# Patient Record
Sex: Female | Born: 1975 | Hispanic: Yes | Marital: Married | Smoking: Never smoker
Health system: Southern US, Community
[De-identification: ages and names within clinical notes are randomized; demographics above are authoritative.]

## PROBLEM LIST (undated history)

## (undated) ENCOUNTER — Inpatient Hospital Stay: Payer: Self-pay

## (undated) DIAGNOSIS — K219 Gastro-esophageal reflux disease without esophagitis: Secondary | ICD-10-CM

## (undated) DIAGNOSIS — R7303 Prediabetes: Secondary | ICD-10-CM

## (undated) DIAGNOSIS — O24419 Gestational diabetes mellitus in pregnancy, unspecified control: Secondary | ICD-10-CM

## (undated) DIAGNOSIS — L700 Acne vulgaris: Secondary | ICD-10-CM

## (undated) DIAGNOSIS — G43909 Migraine, unspecified, not intractable, without status migrainosus: Secondary | ICD-10-CM

## (undated) DIAGNOSIS — K2970 Gastritis, unspecified, without bleeding: Secondary | ICD-10-CM

## (undated) DIAGNOSIS — R1013 Epigastric pain: Secondary | ICD-10-CM

## (undated) HISTORY — DX: Acne vulgaris: L70.0

## (undated) HISTORY — PX: DILATION AND CURETTAGE OF UTERUS: SHX78

## (undated) HISTORY — DX: Gestational diabetes mellitus in pregnancy, unspecified control: O24.419

## (undated) HISTORY — DX: Gastro-esophageal reflux disease without esophagitis: K21.9

## (undated) HISTORY — DX: Epigastric pain: R10.13

## (undated) HISTORY — DX: Gastritis, unspecified, without bleeding: K29.70

## (undated) HISTORY — PX: OTHER SURGICAL HISTORY: SHX169

## (undated) HISTORY — DX: Migraine, unspecified, not intractable, without status migrainosus: G43.909

---
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
## 2007-03-03 ENCOUNTER — Ambulatory Visit: Payer: Self-pay | Admitting: Gastroenterology

## 2007-07-06 ENCOUNTER — Inpatient Hospital Stay: Payer: Self-pay | Admitting: Internal Medicine

## 2009-06-13 ENCOUNTER — Ambulatory Visit: Payer: Self-pay | Admitting: Internal Medicine

## 2009-06-21 ENCOUNTER — Inpatient Hospital Stay: Payer: Self-pay | Admitting: *Deleted

## 2009-12-26 ENCOUNTER — Emergency Department: Payer: Self-pay | Admitting: Emergency Medicine

## 2010-02-27 ENCOUNTER — Ambulatory Visit: Payer: Self-pay | Admitting: Unknown Physician Specialty

## 2010-03-03 LAB — PATHOLOGY REPORT

## 2010-08-16 ENCOUNTER — Emergency Department: Payer: Self-pay | Admitting: Emergency Medicine

## 2010-08-17 ENCOUNTER — Emergency Department: Payer: Self-pay | Admitting: Internal Medicine

## 2013-09-26 ENCOUNTER — Emergency Department: Payer: Self-pay | Admitting: Emergency Medicine

## 2013-09-26 LAB — COMPREHENSIVE METABOLIC PANEL
ALBUMIN: 3.7 g/dL (ref 3.4–5.0)
ALK PHOS: 94 U/L
ALT: 25 U/L
ANION GAP: 8 (ref 7–16)
BUN: 22 mg/dL — ABNORMAL HIGH (ref 7–18)
Bilirubin,Total: 0.3 mg/dL (ref 0.2–1.0)
CHLORIDE: 104 mmol/L (ref 98–107)
Calcium, Total: 8.7 mg/dL (ref 8.5–10.1)
Co2: 27 mmol/L (ref 21–32)
Creatinine: 0.81 mg/dL (ref 0.60–1.30)
GLUCOSE: 98 mg/dL (ref 65–99)
OSMOLALITY: 281 (ref 275–301)
Potassium: 3.5 mmol/L (ref 3.5–5.1)
SGOT(AST): 27 U/L (ref 15–37)
SODIUM: 139 mmol/L (ref 136–145)
Total Protein: 7.9 g/dL (ref 6.4–8.2)

## 2013-09-26 LAB — CBC
HCT: 42.3 % (ref 35.0–47.0)
HGB: 13.7 g/dL (ref 12.0–16.0)
MCH: 28.3 pg (ref 26.0–34.0)
MCHC: 32.3 g/dL (ref 32.0–36.0)
MCV: 88 fL (ref 80–100)
Platelet: 342 10*3/uL (ref 150–440)
RBC: 4.83 10*6/uL (ref 3.80–5.20)
RDW: 13.4 % (ref 11.5–14.5)
WBC: 9 10*3/uL (ref 3.6–11.0)

## 2013-09-26 LAB — URINALYSIS, COMPLETE
BILIRUBIN, UR: NEGATIVE
Bacteria: NONE SEEN
GLUCOSE, UR: NEGATIVE mg/dL (ref 0–75)
Ketone: NEGATIVE
Leukocyte Esterase: NEGATIVE
Nitrite: NEGATIVE
PH: 6 (ref 4.5–8.0)
Protein: NEGATIVE
SPECIFIC GRAVITY: 1.03 (ref 1.003–1.030)
Squamous Epithelial: 1

## 2013-09-26 LAB — LIPASE, BLOOD: LIPASE: 171 U/L (ref 73–393)

## 2014-10-02 ENCOUNTER — Other Ambulatory Visit: Payer: Self-pay | Admitting: Obstetrics and Gynecology

## 2014-10-02 DIAGNOSIS — Z1231 Encounter for screening mammogram for malignant neoplasm of breast: Secondary | ICD-10-CM

## 2014-10-15 ENCOUNTER — Ambulatory Visit
Admission: RE | Admit: 2014-10-15 | Discharge: 2014-10-15 | Disposition: A | Discharge status: Home / Self Care | Payer: Managed Care, Other (non HMO) | Source: Ambulatory Visit | Attending: Obstetrics and Gynecology | Admitting: Obstetrics and Gynecology

## 2014-10-15 DIAGNOSIS — Z1231 Encounter for screening mammogram for malignant neoplasm of breast: Secondary | ICD-10-CM | POA: Insufficient documentation

## 2015-01-13 NOTE — L&D Delivery Note (Signed)
Delivery Note At  2246 a viable and healthy female "Yesenia Davis" was delivered via  (Presentation:OA ;  ).  APGAR:8 ,9  .   Placenta status: delivered intact with 3 vessel Cord:  with the following complications: none  Anesthesia:  none Episiotomy:  none Lacerations:  none Suture Repair: NA Est. Blood Loss (mL):  200  Mom to postpartum.  Baby to Couplet care / Skin to Skin.  Melody N Shambley 10/18/2015, 10:55 PM

## 2015-03-11 ENCOUNTER — Other Ambulatory Visit: Payer: Self-pay | Admitting: Family Medicine

## 2015-03-11 DIAGNOSIS — Z3491 Encounter for supervision of normal pregnancy, unspecified, first trimester: Secondary | ICD-10-CM

## 2015-03-18 ENCOUNTER — Ambulatory Visit
Admission: RE | Admit: 2015-03-18 | Discharge: 2015-03-18 | Disposition: A | Discharge status: Home / Self Care | Payer: Managed Care, Other (non HMO) | Source: Ambulatory Visit | Attending: Family Medicine | Admitting: Family Medicine

## 2015-03-18 DIAGNOSIS — Z3491 Encounter for supervision of normal pregnancy, unspecified, first trimester: Secondary | ICD-10-CM

## 2015-03-18 DIAGNOSIS — Z3481 Encounter for supervision of other normal pregnancy, first trimester: Secondary | ICD-10-CM | POA: Diagnosis not present

## 2015-03-18 DIAGNOSIS — Z3A08 8 weeks gestation of pregnancy: Secondary | ICD-10-CM | POA: Insufficient documentation

## 2015-03-29 ENCOUNTER — Ambulatory Visit (INDEPENDENT_AMBULATORY_CARE_PROVIDER_SITE_OTHER): Payer: Managed Care, Other (non HMO) | Admitting: Obstetrics and Gynecology

## 2015-03-29 VITALS — Ht 63.0 in

## 2015-03-29 DIAGNOSIS — Z3481 Encounter for supervision of other normal pregnancy, first trimester: Secondary | ICD-10-CM

## 2015-03-29 DIAGNOSIS — R102 Pelvic and perineal pain: Secondary | ICD-10-CM

## 2015-03-29 DIAGNOSIS — O9989 Other specified diseases and conditions complicating pregnancy, childbirth and the puerperium: Secondary | ICD-10-CM

## 2015-03-29 DIAGNOSIS — O26899 Other specified pregnancy related conditions, unspecified trimester: Secondary | ICD-10-CM

## 2015-03-29 DIAGNOSIS — Z3491 Encounter for supervision of normal pregnancy, unspecified, first trimester: Secondary | ICD-10-CM

## 2015-03-29 NOTE — Patient Instructions (Signed)
Commonly Asked Questions During Pregnancy  Cats: A parasite can be excreted in cat feces.  To avoid exposure you need to have another person empty the little box.  If you must empty the litter box you will need to wear gloves.  Wash your hands after handling your cat.  This parasite can also be found in raw or undercooked meat so this should also be avoided.  Colds, Sore Throats, Flu: Please check your medication sheet to see what you can take for symptoms.  If your symptoms are unrelieved by these medications please call the office.  Dental Work: Most any dental work your dentist recommends is permitted.  X-rays should only be taken during the first trimester if absolutely necessary.  Your abdomen should be shielded with a lead apron during all x-rays.  Please notify your provider prior to receiving any x-rays.  Novocaine is fine; gas is not recommended.  If your dentist requires a note from us prior to dental work please call the office and we will provide one for you.  Exercise: Exercise is an important part of staying healthy during your pregnancy.  You may continue most exercises you were accustomed to prior to pregnancy.  Later in your pregnancy you will most likely notice you have difficulty with activities requiring balance like riding a bicycle.  It is important that you listen to your body and avoid activities that put you at a higher risk of falling.  Adequate rest and staying well hydrated are a must!  If you have questions about the safety of specific activities ask your provider.    Exposure to Children with illness: Try to avoid obvious exposure; report any symptoms to us when noted,  If you have chicken pos, red measles or mumps, you should be immune to these diseases.   Please do not take any vaccines while pregnant unless you have checked with your OB provider.  Fetal Movement: After 28 weeks we recommend you do "kick counts" twice daily.  Lie or sit down in a calm quiet environment and  count your baby movements "kicks".  You should feel your baby at least 10 times per hour.  If you have not felt 10 kicks within the first hour get up, walk around and have something sweet to eat or drink then repeat for an additional hour.  If count remains less than 10 per hour notify your provider.  Fumigating: Follow your pest control agent's advice as to how long to stay out of your home.  Ventilate the area well before re-entering.  Hemorrhoids:   Most over-the-counter preparations can be used during pregnancy.  Check your medication to see what is safe to use.  It is important to use a stool softener or fiber in your diet and to drink lots of liquids.  If hemorrhoids seem to be getting worse please call the office.   Hot Tubs:  Hot tubs Jacuzzis and saunas are not recommended while pregnant.  These increase your internal body temperature and should be avoided.  Intercourse:  Sexual intercourse is safe during pregnancy as long as you are comfortable, unless otherwise advised by your provider.  Spotting may occur after intercourse; report any bright red bleeding that is heavier than spotting.  Labor:  If you know that you are in labor, please go to the hospital.  If you are unsure, please call the office and let us help you decide what to do.  Lifting, straining, etc:  If your job requires heavy   lifting or straining please check with your provider for any limitations.  Generally, you should not lift items heavier than that you can lift simply with your hands and arms (no back muscles)  Painting:  Paint fumes do not harm your pregnancy, but may make you ill and should be avoided if possible.  Latex or water based paints have less odor than oils.  Use adequate ventilation while painting.  Permanents & Hair Color:  Chemicals in hair dyes are not recommended as they cause increase hair dryness which can increase hair loss during pregnancy.  " Highlighting" and permanents are allowed.  Dye may be  absorbed differently and permanents may not hold as well during pregnancy.  Sunbathing:  Use a sunscreen, as skin burns easily during pregnancy.  Drink plenty of fluids; avoid over heating.  Tanning Beds:  Because their possible side effects are still unknown, tanning beds are not recommended.  Ultrasound Scans:  Routine ultrasounds are performed at approximately 20 weeks.  You will be able to see your baby's general anatomy an if you would like to know the gender this can usually be determined as well.  If it is questionable when you conceived you may also receive an ultrasound early in your pregnancy for dating purposes.  Otherwise ultrasound exams are not routinely performed unless there is a medical necessity.  Although you can request a scan we ask that you pay for it when conducted because insurance does not cover " patient request" scans.  Work: If your pregnancy proceeds without complications you may work until your due date, unless your physician or employer advises otherwise.  Round Ligament Pain/Pelvic Discomfort:  Sharp, shooting pains not associated with bleeding are fairly common, usually occurring in the second trimester of pregnancy.  They tend to be worse when standing up or when you remain standing for long periods of time.  These are the result of pressure of certain pelvic ligaments called "round ligaments".  Rest, Tylenol and heat seem to be the most effective relief.  As the womb and fetus grow, they rise out of the pelvis and the discomfort improves.  Please notify the office if your pain seems different than that described.  It may represent a more serious condition. First Trimester of Pregnancy The first trimester of pregnancy is from week 1 until the end of week 12 (months 1 through 3). A week after a sperm fertilizes an egg, the egg will implant on the wall of the uterus. This embryo will begin to develop into a baby. Genes from you and your partner are forming the baby. The  female genes determine whether the baby is a boy or a girl. At 6-8 weeks, the eyes and face are formed, and the heartbeat can be seen on ultrasound. At the end of 12 weeks, all the baby's organs are formed.  Now that you are pregnant, you will want to do everything you can to have a healthy baby. Two of the most important things are to get good prenatal care and to follow your health care provider's instructions. Prenatal care is all the medical care you receive before the baby's birth. This care will help prevent, find, and treat any problems during the pregnancy and childbirth. BODY CHANGES Your body goes through many changes during pregnancy. The changes vary from woman to woman.   You may gain or lose a couple of pounds at first.  You may feel sick to your stomach (nauseous) and throw up (vomit). If the  vomiting is uncontrollable, call your health care provider.  You may tire easily.  You may develop headaches that can be relieved by medicines approved by your health care provider.  You may urinate more often. Painful urination may mean you have a bladder infection.  You may develop heartburn as a result of your pregnancy.  You may develop constipation because certain hormones are causing the muscles that push waste through your intestines to slow down.  You may develop hemorrhoids or swollen, bulging veins (varicose veins).  Your breasts may begin to grow larger and become tender. Your nipples may stick out more, and the tissue that surrounds them (areola) may become darker.  Your gums may bleed and may be sensitive to brushing and flossing.  Dark spots or blotches (chloasma, mask of pregnancy) may develop on your face. This will likely fade after the baby is born.  Your menstrual periods will stop.  You may have a loss of appetite.  You may develop cravings for certain kinds of food.  You may have changes in your emotions from day to day, such as being excited to be pregnant or  being concerned that something may go wrong with the pregnancy and baby.  You may have more vivid and strange dreams.  You may have changes in your hair. These can include thickening of your hair, rapid growth, and changes in texture. Some women also have hair loss during or after pregnancy, or hair that feels dry or thin. Your hair will most likely return to normal after your baby is born. WHAT TO EXPECT AT YOUR PRENATAL VISITS During a routine prenatal visit:  You will be weighed to make sure you and the baby are growing normally.  Your blood pressure will be taken.  Your abdomen will be measured to track your baby's growth.  The fetal heartbeat will be listened to starting around week 10 or 12 of your pregnancy.  Test results from any previous visits will be discussed. Your health care provider may ask you:  How you are feeling.  If you are feeling the baby move.  If you have had any abnormal symptoms, such as leaking fluid, bleeding, severe headaches, or abdominal cramping.  If you are using any tobacco products, including cigarettes, chewing tobacco, and electronic cigarettes.  If you have any questions. Other tests that may be performed during your first trimester include:  Blood tests to find your blood type and to check for the presence of any previous infections. They will also be used to check for low iron levels (anemia) and Rh antibodies. Later in the pregnancy, blood tests for diabetes will be done along with other tests if problems develop.  Urine tests to check for infections, diabetes, or protein in the urine.  An ultrasound to confirm the proper growth and development of the baby.  An amniocentesis to check for possible genetic problems.  Fetal screens for spina bifida and Down syndrome.  You may need other tests to make sure you and the baby are doing well.  HIV (human immunodeficiency virus) testing. Routine prenatal testing includes screening for HIV,  unless you choose not to have this test. HOME CARE INSTRUCTIONS  Medicines  Follow your health care provider's instructions regarding medicine use. Specific medicines may be either safe or unsafe to take during pregnancy.  Take your prenatal vitamins as directed.  If you develop constipation, try taking a stool softener if your health care provider approves. Diet  Eat regular, well-balanced meals. Choose  a variety of foods, such as meat or vegetable-based protein, fish, milk and low-fat dairy products, vegetables, fruits, and whole grain breads and cereals. Your health care provider will help you determine the amount of weight gain that is right for you.  Avoid raw meat and uncooked cheese. These carry germs that can cause birth defects in the baby.  Eating four or five small meals rather than three large meals a day may help relieve nausea and vomiting. If you start to feel nauseous, eating a few soda crackers can be helpful. Drinking liquids between meals instead of during meals also seems to help nausea and vomiting.  If you develop constipation, eat more high-fiber foods, such as fresh vegetables or fruit and whole grains. Drink enough fluids to keep your urine clear or pale yellow. Activity and Exercise  Exercise only as directed by your health care provider. Exercising will help you:  Control your weight.  Stay in shape.  Be prepared for labor and delivery.  Experiencing pain or cramping in the lower abdomen or low back is a good sign that you should stop exercising. Check with your health care provider before continuing normal exercises.  Try to avoid standing for long periods of time. Move your legs often if you must stand in one place for a long time.  Avoid heavy lifting.  Wear low-heeled shoes, and practice good posture.  You may continue to have sex unless your health care provider directs you otherwise. Relief of Pain or Discomfort  Wear a good support bra for  breast tenderness.   Take warm sitz baths to soothe any pain or discomfort caused by hemorrhoids. Use hemorrhoid cream if your health care provider approves.   Rest with your legs elevated if you have leg cramps or low back pain.  If you develop varicose veins in your legs, wear support hose. Elevate your feet for 15 minutes, 3-4 times a day. Limit salt in your diet. Prenatal Care  Schedule your prenatal visits by the twelfth week of pregnancy. They are usually scheduled monthly at first, then more often in the last 2 months before delivery.  Write down your questions. Take them to your prenatal visits.  Keep all your prenatal visits as directed by your health care provider. Safety  Wear your seat belt at all times when driving.  Make a list of emergency phone numbers, including numbers for family, friends, the hospital, and police and fire departments. General Tips  Ask your health care provider for a referral to a local prenatal education class. Begin classes no later than at the beginning of month 6 of your pregnancy.  Ask for help if you have counseling or nutritional needs during pregnancy. Your health care provider can offer advice or refer you to specialists for help with various needs.  Do not use hot tubs, steam rooms, or saunas.  Do not douche or use tampons or scented sanitary pads.  Do not cross your legs for long periods of time.  Avoid cat litter boxes and soil used by cats. These carry germs that can cause birth defects in the baby and possibly loss of the fetus by miscarriage or stillbirth.  Avoid all smoking, herbs, alcohol, and medicines not prescribed by your health care provider. Chemicals in these affect the formation and growth of the baby.  Do not use any tobacco products, including cigarettes, chewing tobacco, and electronic cigarettes. If you need help quitting, ask your health care provider. You may receive counseling support and  other resources to help  you quit.  Schedule a dentist appointment. At home, brush your teeth with a soft toothbrush and be gentle when you floss. SEEK MEDICAL CARE IF:   You have dizziness.  You have mild pelvic cramps, pelvic pressure, or nagging pain in the abdominal area.  You have persistent nausea, vomiting, or diarrhea.  You have a bad smelling vaginal discharge.  You have pain with urination.  You notice increased swelling in your face, hands, legs, or ankles. SEEK IMMEDIATE MEDICAL CARE IF:   You have a fever.  You are leaking fluid from your vagina.  You have spotting or bleeding from your vagina.  You have severe abdominal cramping or pain.  You have rapid weight gain or loss.  You vomit blood or material that looks like coffee grounds.  You are exposed to [place] measles and have never had them.  You are exposed to fifth disease or chickenpox.  You develop a severe headache.  You have shortness of breath.  You have any kind of trauma, such as from a fall or a car accident.   This information is not intended to replace advice given to you by your health care provider. Make sure you discuss any questions you have with your health care provider.   Document Released: 12/23/2000 Document Revised: 01/19/2014 Document Reviewed: 11/08/2012 Elsevier Interactive Patient Education 2016 [employer]. How a Baby Grows During Pregnancy Pregnancy begins when a female's sperm enters a female's egg (fertilization). This happens in one of the tubes (fallopian tubes) that connect the ovaries to the womb (uterus). The fertilized egg is called an embryo until it reaches 10 weeks. From 10 weeks until birth, it is called a fetus. The fertilized egg moves down the fallopian tube to the uterus. Then it implants into the lining of the uterus and begins to grow. The developing fetus receives oxygen and nutrients through the pregnant woman's bloodstream and the tissues that grow (placenta) to support the fetus.  The placenta is the life support system for the fetus. It provides nutrition and removes waste. Learning as much as you can about your pregnancy and how your baby is developing can help you enjoy the experience. It can also make you aware of when there might be a problem and when to ask questions. HOW LONG DOES A TYPICAL PREGNANCY LAST? A pregnancy usually lasts 280 days, or about 40 weeks. Pregnancy is divided into three trimesters:  First trimester: 0-13 weeks.  Second trimester: 14-27 weeks.  Third trimester: 28-40 weeks. The day when your baby is considered ready to be born (full term) is your estimated date of delivery. HOW DOES MY BABY DEVELOP MONTH BY MONTH? First month  The fertilized egg attaches to the inside of the uterus.  Some cells will form the placenta. Others will form the fetus.  The arms, legs, brain, spinal cord, lungs, and heart begin to develop.  At the end of the first month, the heart begins to beat. Second month  The bones, inner ear, eyelids, hands, and feet form.  The genitals develop.  By the end of 8 weeks, all major organs are developing. Third month  All of the internal organs are forming.  Teeth develop below the gums.  Bones and muscles begin to grow. The spine can flex.  The skin is transparent.  Fingernails and toenails begin to form.  Arms and legs continue to grow longer, and hands and feet develop.  The fetus is about 3 in (7.6  cm) long. Fourth month  The placenta is completely formed.  The external sex organs, neck, outer ear, eyebrows, eyelids, and fingernails are formed.  The fetus can hear, swallow, and move its arms and legs.  The kidneys begin to produce urine.  The skin is covered with a white waxy coating (vernix) and very fine hair (lanugo). Fifth month  The fetus moves around more and can be felt for the first time (quickening).  The fetus starts to sleep and wake up and may begin to suck its finger.  The  nails grow to the end of the fingers.  The organ in the digestive system that makes bile (gallbladder) functions and helps to digest the nutrients.  If your baby is a girl, eggs are present in her ovaries. If your baby is a boy, testicles start to move down into his scrotum. Sixth month  The lungs are formed, but the fetus is not yet able to breathe.  The eyes open. The brain continues to develop.  Your baby has fingerprints and toe prints. Your baby's hair grows thicker.  At the end of the second trimester, the fetus is about 9 in (22.9 cm) long. Seventh month  The fetus kicks and stretches.  The eyes are developed enough to sense changes in light.  The hands can make a grasping motion.  The fetus responds to sound. Eighth month  All organs and body systems are fully developed and functioning.  Bones harden and taste buds develop. The fetus may hiccup.  Certain areas of the brain are still developing. The skull remains soft. Ninth month  The fetus gains about  lb (0.23 kg) each week.  The lungs are fully developed.  Patterns of sleep develop.  The fetus's head typically moves into a head-down position (vertex) in the uterus to prepare for birth. If the buttocks move into a vertex position instead, the baby is breech.  The fetus weighs 6-9 lbs (2.72-4.08 kg) and is 19-20 in (48.26-50.8 cm) long. WHAT CAN I DO TO HAVE A HEALTHY PREGNANCY AND HELP MY BABY DEVELOP? Eating and Drinking  Eat a healthy diet.  Talk with your health care provider to make sure that you are getting the nutrients that you and your baby need.  Visit www.[employer].be to learn about creating a healthy diet.  Gain a healthy amount of weight during pregnancy as advised by your health care provider. This is usually 25-35 pounds. You may need to:  Gain more if you were underweight before getting pregnant or if you are pregnant with more than one baby.  Gain less if you were overweight or  obese when you got pregnant. Medicines and Vitamins  Take prenatal vitamins as directed by your health care provider. These include vitamins such as folic acid, iron, calcium, and vitamin D. They are important for healthy development.  Take medicines only as directed by your health care provider. Read labels and ask a pharmacist or your health care provider whether over-the-counter medicines, supplements, and prescription drugs are safe to take during pregnancy. Activities  Be physically active as advised by your health care provider. Ask your health care provider to recommend activities that are safe for you to do, such as walking or swimming.  Do not participate in strenuous or extreme sports. Lifestyle  Do not drink alcohol.  Do not use any tobacco products, including cigarettes, chewing tobacco, or electronic cigarettes. If you need help quitting, ask your health care provider.  Do not use illegal drugs.  Safety  Avoid exposure to mercury, lead, or other heavy metals. Ask your health care provider about common sources of these heavy metals.  Avoid listeria infection during pregnancy. Follow these precautions:  Do not eat soft cheeses or deli meats.  Do not eat hot dogs unless they have been warmed up to the point of steaming, such as in the microwave oven.  Do not drink unpasteurized milk.  Avoid toxoplasmosis infection during pregnancy. Follow these precautions:  Do not change your cat's litter box, if you have a cat. Ask someone else to do this for you.  Wear gardening gloves while working in the yard. General Instructions  Keep all follow-up visits as directed by your health care provider. This is important. This includes prenatal care and screening tests.  Manage any chronic health conditions. Work closely with your health care provider to keep conditions, such as diabetes, under control. HOW DO I KNOW IF MY BABY IS DEVELOPING WELL? At each prenatal visit, your health  care provider will do several different tests to check on your health and keep track of your baby's development. These include:  Fundal height.  Your health care provider will measure your growing belly from top to bottom using a tape measure.  Your health care provider will also feel your belly to determine your baby's position.  Heartbeat.  An ultrasound in the first trimester can confirm pregnancy and show a heartbeat, depending on how far along you are.  Your health care provider will check your baby's heart rate at every prenatal visit.  As you get closer to your delivery date, you may have regular fetal heart rate monitoring to make sure that your baby is not in distress.  Second trimester ultrasound.  This ultrasound checks your baby's development. It also indicates your baby's gender. WHAT SHOULD I DO IF I HAVE CONCERNS ABOUT MY BABY'S DEVELOPMENT? Always talk with your health care provider about any concerns that you may have.   This information is not intended to replace advice given to you by your health care provider. Make sure you discuss any questions you have with your health care provider.   Document Released: 06/17/2007 Document Revised: 09/19/2014 Document Reviewed: 06/07/2013 Elsevier Interactive Patient Education 2016 [employer]. Pregnancy and Zika Virus Disease Zika virus disease, or Zika, is an illness that can spread to people from mosquitoes that carry the virus. It may also spread from person to person through infected body fluids. Zika first occurred in [place], but recently it has spread to new areas. The virus occurs in tropical climates. The location of Zika continues to change. Most people who become infected with Zika virus do not develop serious illness. However, Zika may cause birth defects in an unborn baby whose mother is infected with the virus. It may also increase the risk of miscarriage. WHAT ARE THE SYMPTOMS OF ZIKA VIRUS DISEASE? In many cases,  people who have been infected with Zika virus do not develop any symptoms. If symptoms appear, they usually start about a week after the person is infected. Symptoms are usually mild. They may include:  Fever.  Rash.  Red eyes.  Joint pain. HOW DOES ZIKA VIRUS DISEASE SPREAD? The main way that Zika virus spreads is through the bite of a certain type of mosquito. Unlike most types of mosquitos, which bite only at night, the type of mosquito that carries Zika virus bites both at night and during the day. Zika virus can also spread through sexual contact, through a  blood transfusion, and from a mother to her baby before or during birth. Once you have had Zika virus disease, it is unlikely that you will get it again. CAN I PASS ZIKA TO MY BABY DURING PREGNANCY? Yes, Zika can pass from a mother to her baby before or during birth. WHAT PROBLEMS CAN ZIKA CAUSE FOR MY BABY? A woman who is infected with Zika virus while pregnant is at risk of having her baby born with a condition in which the brain or head is smaller than expected (microcephaly). Babies who have microcephaly can have developmental delays, seizures, hearing problems, and vision problems. Having Zika virus disease during pregnancy can also increase the risk of miscarriage. HOW CAN ZIKA VIRUS DISEASE BE PREVENTED? There is no vaccine to prevent Zika. The best way to prevent the disease is to avoid infected mosquitoes and avoid exposure to body fluids that can spread the virus. Avoid any possible exposure to Zika by taking the following precautions. For women and their sex partners:  Avoid traveling to high-risk areas. The locations where [place] is being reported change often. To identify high-risk areas, check the CDC travel website: http://davidson-gomez.com/  If you or your sex partner must travel to a high-risk area, talk with a health care provider before and after traveling.  Take all precautions to avoid mosquito bites if you  live in, or travel to, any of the high-risk areas. Insect repellents are safe to use during pregnancy.  Ask your health care provider when it is safe to have sexual contact. For women:  If you are pregnant or trying to become pregnant, avoid sexual contact with persons who may have been exposed to [place] virus, persons who have possible symptoms of Zika, or persons whose history you are unsure about. If you choose to have sexual contact with someone who may have been exposed to [place] virus, use condoms correctly during the entire duration of sexual activity, every time. Do not share sexual devices, as you may be exposed to body fluids.  Ask your health care provider about when it is safe to attempt pregnancy after a possible exposure to Zika virus. WHAT STEPS SHOULD I TAKE TO AVOID MOSQUITO BITES? Take these steps to avoid mosquito bites when you are in a high-risk area:  Wear loose clothing that covers your arms and legs.  Limit your outdoor activities.  Do not open windows unless they have window screens.  Sleep under mosquito nets.  Use insect repellent. The best insect repellents have:  DEET, picaridin, oil of lemon eucalyptus (OLE), or IR3535 in them.  Higher amounts of an active ingredient in them.  Remember that insect repellents are safe to use during pregnancy.  Do not use OLE on children who are younger than 27 years of age. Do not use insect repellent on babies who are younger than 28 months of age.  Cover your child's stroller with mosquito netting. Make sure the netting fits snugly and that any loose netting does not cover your child's mouth or nose. Do not use a blanket as a mosquito-protection cover.  Do not apply insect repellent underneath clothing.  If you are using sunscreen, apply the sunscreen before applying the insect repellent.  Treat clothing with permethrin. Do not apply permethrin directly to your skin. Follow label directions for safe use.  Get rid of  standing water, where mosquitoes may reproduce. Standing water is often found in items such as buckets, bowls, animal food dishes, and flowerpots. When you return from traveling  to any high-risk area, continue taking actions to protect yourself against mosquito bites for 3 weeks, even if you show no signs of illness. This will prevent spreading Zika virus to uninfected mosquitoes. WHAT SHOULD I KNOW ABOUT THE SEXUAL TRANSMISSION OF ZIKA? People can spread Zika to their sexual partners during vaginal, anal, or oral sex, or by sharing sexual devices. Many people with [place] do not develop symptoms, so a person could spread the disease without knowing that they are infected. The greatest risk is to women who are pregnant or who may become pregnant. Zika virus can live longer in semen than it can live in blood. Couples can prevent sexual transmission of the virus by:  Using condoms correctly during the entire duration of sexual activity, every time. This includes vaginal, anal, and oral sex.  Not sharing sexual devices. Sharing increases your risk of being exposed to body fluid from another person.  Avoiding all sexual activity until your health care provider says it is safe. SHOULD I BE TESTED FOR ZIKA VIRUS? A sample of your blood can be tested for Zika virus. A pregnant woman should be tested if she may have been exposed to the virus or if she has symptoms of Zika. She may also have additional tests done during her pregnancy, such ultrasound testing. Talk with your health care provider about which tests are recommended.   This information is not intended to replace advice given to you by your health care provider. Make sure you discuss any questions you have with your health care provider.   Document Released: 09/19/2014 Document Reviewed: 09/12/2014 Elsevier Interactive Patient Education [employer]

## 2015-03-29 NOTE — Progress Notes (Signed)
Yesenia ShellingSofia Puskarich presents for NOB nurse interview visit. Confirmation at [facility] 03/27/2015.  G4-.  [id number]-. Pregnancy education material explained and given. __NO  cats in the home. NOB labs ordered.  HIV labs and Drug screen were explained and ordered. PNV encouraged. NT to discuss with provider.  Pt had u/s at [facility] on 03/18/15  d/t pelvic pain showed IUP at  8w 2d with an EDD of - 10/26/2015. Pt has not had any VB. Flu vaccine utd. Last pap 09/2014- wnl. NO h/o of abn. No nausea or vomiting. Pelvic pain comes and goes. Advised tylenol as directed. Stay hydrated.   Pt. To follow up with provider in _2_ weeks for NOB physical.  All questions answered.

## 2015-03-30 LAB — CBC WITH DIFFERENTIAL/PLATELET
BASOS: 0 %
Basophils Absolute: 0 10*3/uL (ref 0.0–0.2)
EOS (ABSOLUTE): 0.2 10*3/uL (ref 0.0–0.4)
EOS: 2 %
HEMATOCRIT: 41.6 % (ref 34.0–46.6)
Hemoglobin: 13.6 g/dL (ref 11.1–15.9)
Immature Grans (Abs): 0 10*3/uL (ref 0.0–0.1)
Immature Granulocytes: 0 %
LYMPHS ABS: 1.8 10*3/uL (ref 0.7–3.1)
Lymphs: 19 %
MCH: 28.5 pg (ref 26.6–33.0)
MCHC: 32.7 g/dL (ref 31.5–35.7)
MCV: 87 fL (ref 79–97)
MONOS ABS: 0.6 10*3/uL (ref 0.1–0.9)
Monocytes: 7 %
Neutrophils Absolute: 6.9 10*3/uL (ref 1.4–7.0)
Neutrophils: 72 %
PLATELETS: 321 10*3/uL (ref 150–379)
RBC: 4.77 x10E6/uL (ref 3.77–5.28)
RDW: 13.4 % (ref 12.3–15.4)
WBC: 9.6 10*3/uL (ref 3.4–10.8)

## 2015-03-30 LAB — PAIN MGT SCRN (14 DRUGS), UR
AMPHETAMINE SCRN UR: NEGATIVE ng/mL
Barbiturate Screen, Ur: NEGATIVE ng/mL
Benzodiazepine Screen, Urine: NEGATIVE ng/mL
Buprenorphine, Urine: NEGATIVE ng/mL
CANNABINOIDS UR QL SCN: NEGATIVE ng/mL
COCAINE(METAB.) SCREEN, URINE: NEGATIVE ng/mL
Creatinine(Crt), U: 149.9 mg/dL (ref 20.0–300.0)
Fentanyl, Urine: NEGATIVE pg/mL
MEPERIDINE SCREEN, URINE: NEGATIVE ng/mL
Methadone Scn, Ur: NEGATIVE ng/mL
Opiate Scrn, Ur: NEGATIVE ng/mL
Oxycodone+Oxymorphone Ur Ql Scn: NEGATIVE ng/mL
PCP SCRN UR: NEGATIVE ng/mL
PROPOXYPHENE SCREEN: NEGATIVE ng/mL
Ph of Urine: 5.7 (ref 4.5–8.9)
TRAMADOL UR QL SCN: NEGATIVE ng/mL

## 2015-03-30 LAB — HIV ANTIBODY (ROUTINE TESTING W REFLEX): HIV SCREEN 4TH GENERATION: NONREACTIVE

## 2015-03-30 LAB — URINALYSIS, ROUTINE W REFLEX MICROSCOPIC
Bilirubin, UA: NEGATIVE
GLUCOSE, UA: NEGATIVE
Ketones, UA: NEGATIVE
LEUKOCYTES UA: NEGATIVE
Nitrite, UA: NEGATIVE
PH UA: 6 (ref 5.0–7.5)
PROTEIN UA: NEGATIVE
RBC, UA: NEGATIVE
Specific Gravity, UA: >=1.03 — AB (ref 1.005–1.030)
UUROB: 0.2 mg/dL (ref 0.2–1.0)

## 2015-03-30 LAB — ANTIBODY SCREEN: Antibody Screen: NEGATIVE

## 2015-03-30 LAB — NICOTINE SCREEN, URINE: Cotinine Ql Scrn, Ur: NEGATIVE ng/mL

## 2015-03-30 LAB — HEPATITIS B SURFACE ANTIGEN: Hepatitis B Surface Ag: NEGATIVE

## 2015-03-30 LAB — ABO AND RH: RH TYPE: POSITIVE

## 2015-03-30 LAB — RPR: RPR: NONREACTIVE

## 2015-03-30 LAB — VARICELLA ZOSTER ANTIBODY, IGG: VARICELLA: 397 {index} (ref >165)

## 2015-03-30 LAB — RUBELLA SCREEN: RUBELLA: 8.12 {index} (ref >0.99)

## 2015-03-31 LAB — CULTURE, OB URINE

## 2015-03-31 LAB — URINE CULTURE, OB REFLEX: Organism ID, Bacteria: NO GROWTH

## 2015-04-03 LAB — GC/CHLAMYDIA PROBE AMP
Chlamydia trachomatis, NAA: NEGATIVE
NEISSERIA GONORRHOEAE BY PCR: NEGATIVE

## 2015-04-19 ENCOUNTER — Encounter: Payer: Managed Care, Other (non HMO) | Admitting: Obstetrics and Gynecology

## 2015-05-22 ENCOUNTER — Other Ambulatory Visit: Payer: Self-pay | Admitting: Family Medicine

## 2015-05-22 DIAGNOSIS — Z331 Pregnant state, incidental: Secondary | ICD-10-CM

## 2015-05-24 ENCOUNTER — Ambulatory Visit: Payer: Managed Care, Other (non HMO)

## 2015-05-31 ENCOUNTER — Ambulatory Visit
Admission: RE | Admit: 2015-05-31 | Discharge: 2015-05-31 | Disposition: A | Discharge status: Home / Self Care | Payer: Managed Care, Other (non HMO) | Source: Ambulatory Visit | Attending: Family Medicine | Admitting: Family Medicine

## 2015-05-31 DIAGNOSIS — Z331 Pregnant state, incidental: Secondary | ICD-10-CM | POA: Diagnosis present

## 2015-05-31 DIAGNOSIS — Z3A19 19 weeks gestation of pregnancy: Secondary | ICD-10-CM | POA: Insufficient documentation

## 2015-05-31 DIAGNOSIS — O0932 Supervision of pregnancy with insufficient antenatal care, second trimester: Secondary | ICD-10-CM | POA: Insufficient documentation

## 2015-07-14 ENCOUNTER — Inpatient Hospital Stay
Admission: EM | Admit: 2015-07-14 | Discharge: 2015-07-14 | Disposition: A | Discharge status: Home / Self Care | Payer: Managed Care, Other (non HMO) | Attending: Obstetrics and Gynecology | Admitting: Obstetrics and Gynecology

## 2015-07-14 DIAGNOSIS — Z3A25 25 weeks gestation of pregnancy: Secondary | ICD-10-CM | POA: Insufficient documentation

## 2015-07-14 DIAGNOSIS — O368120 Decreased fetal movements, second trimester, not applicable or unspecified: Secondary | ICD-10-CM | POA: Insufficient documentation

## 2015-07-14 NOTE — OB Triage Note (Signed)
Patient presents with c/o no fetal movement since 0630 today.  Denies any vaginal bleeding or spotting.  Denies leaking fluid or any vaginal discharge.  Abdomen soft, non-tender.  efm applied, [id number]-[id number].  No complaints of cramping or contractions reported.

## 2015-08-22 ENCOUNTER — Encounter: Payer: Self-pay | Admitting: Obstetrics and Gynecology

## 2015-08-22 ENCOUNTER — Ambulatory Visit (INDEPENDENT_AMBULATORY_CARE_PROVIDER_SITE_OTHER): Payer: Managed Care, Other (non HMO) | Admitting: Obstetrics and Gynecology

## 2015-08-22 ENCOUNTER — Encounter: Payer: Self-pay | Admitting: *Deleted

## 2015-08-22 VITALS — BP 106/68 | HR 72 | Wt 159.6 lb

## 2015-08-22 DIAGNOSIS — Z23 Encounter for immunization: Secondary | ICD-10-CM | POA: Diagnosis not present

## 2015-08-22 DIAGNOSIS — O09523 Supervision of elderly multigravida, third trimester: Secondary | ICD-10-CM

## 2015-08-22 DIAGNOSIS — O24419 Gestational diabetes mellitus in pregnancy, unspecified control: Secondary | ICD-10-CM

## 2015-08-22 DIAGNOSIS — Z3493 Encounter for supervision of normal pregnancy, unspecified, third trimester: Secondary | ICD-10-CM | POA: Diagnosis not present

## 2015-08-22 MED ORDER — TETANUS-DIPHTH-ACELL PERTUSSIS 5-2.5-18.5 LF-MCG/0.5 IM SUSP
0.5000 mL | Freq: Once | INTRAMUSCULAR | Status: AC
  Administered 2015-08-22: 0.5 mL via INTRAMUSCULAR

## 2015-08-22 NOTE — Progress Notes (Signed)
ROB-doing well, except sciatic pain periodically- did PT, but now doing exercises at home; needs diabetes management

## 2015-08-22 NOTE — Progress Notes (Signed)
NOB transfer from [facility]- pt is doing well Blood consent signed, tdap given

## 2015-08-22 NOTE — Patient Instructions (Signed)
Diabetes mellitus gestacional (Gestational Diabetes Mellitus) La diabetes mellitus gestacional, ms comnmente conocida como diabetes gestacional es un tipo de diabetes que desarrollan algunas mujeres durante el embarazo. En la diabetes gestacional, el pncreas no produce suficiente insulina (una hormona) o las clulas son menos sensibles a la insulina producida (resistencia a la insulina), o ambas cosas. Normalmente, la insulina mueve los azcares de los alimentos a las clulas de los tejidos. Las clulas de los tejidos utilizan los azcares para obtener energa. La falta de insulina o la falta de una respuesta normal a la insulina hace que el exceso de azcar se acumule en la sangre en lugar de penetrar en las clulas de los tejidos. Como resultado, se producen niveles altos de azcar en la sangre (hiperglucemia). El efecto de los niveles altos de azcar (glucosa) puede causar muchos problemas.  FACTORES DE RIESGO Usted tiene mayor probabilidad de desarrollar diabetes gestacional si tiene antecedentes familiares de diabetes y tambin si tiene uno o ms de los siguientes factores de riesgo:  ndice de masa corporal superior a 30 (obesidad).  Embarazo previo con diabetes gestacional.  La edad avanzada en el momento del embarazo. Si se mantienen los niveles de glucosa en la sangre en un rango normal durante el embarazo, las mujeres pueden tener un embarazo saludable. Si los niveles de glucosa en la sangre no estn bien controlados, puede haber riesgos para usted, el feto o el recin nacido, o durante el trabajo de parto y el parto.  SNTOMAS  Si se presentan sntomas, stos son similares a los sntomas que normalmente experimentar durante el embarazo. Los sntomas de la diabetes gestacional son:   Aumento de la sed (polidipsia).  Aumento de la miccin (poliuria).  Orina con ms frecuencia durante la noche (nocturia).  Prdida de peso. La prdida de peso puede ser muy rpida.  Infecciones  frecuentes y recurrentes.  Cansancio (fatiga).  Debilidad.  Cambios en la visin, como visin borrosa.  Olor a fruta en el aliento.  Dolor abdominal. DIAGNSTICO La diabetes se diagnostica cuando hay aumento de los niveles de glucosa en la sangre. El nivel de glucosa en la sangre puede controlarse en uno o ms de los siguientes anlisis de sangre:  Medicin de glucosa en la sangre en ayunas. No se le permitir comer durante al menos 8 horas antes de que se tome una muestra de sangre.  Pruebas al azar de glucosa en la sangre. El nivel de glucosa en la sangre se controla en cualquier momento del da sin importar el momento en que haya comido.  Prueba de tolerancia a la glucosa oral (PTGO). La glucosa en la sangre se mide despus de no haber comido (ayunas) durante una a tres horas y despus de beber una bebida que contenga glucosa. Dado que las hormonas que causan la resistencia a la insulina son ms altas alrededor de las semanas 24 a 28 de embarazo, generalmente se realiza una PTGO durante ese tiempo. Si tiene factores de riesgo, en la primera visita prenatal pueden hacerle pruebas de deteccin de diabetes tipo 2 no diagnosticada. TRATAMIENTO  La diabetes gestacional debe controlarse en primer lugar con dieta y ejercicios. Pueden agregarse medicamentos, pero solo si son necesarios.  Usted tendr que tomar medicamentos para la diabetes o insulina diariamente para mantener los niveles de glucosa en la sangre en el rango deseado.  Usted tendr que combinar la dosis de insulina con la actividad fsica y la eleccin de alimentos saludables. Si tiene diabetes gestacional, el objetivo del tratamiento ser   mantener los siguientes niveles sanguneos de glucosa:  Antes de las comidas (preprandial): valor de 95 mg/dl o inferior.  Despus de las comidas (posprandial):  Una hora despus de la comida: valor de 140 mg/dl o inferior.  Dos horas despus de la comida: valor de 120 mg/dl o  inferior. Si tiene diabetes tipo 1 o tipo 2 preexistente, el objetivo del tratamiento ser mantener los siguientes niveles sanguneos de glucosa:  Antes de las comidas, a la hora de acostarse y durante la noche: de 60 a 99 mg/dl.  Despus de las comidas: valor mximo de 100 a 129 mg/dl. INSTRUCCIONES PARA EL CUIDADO EN EL HOGAR   Controle su nivel de hemoglobina A1c dos veces al ao.  Contrlese a diario el nivel de glucosa en la sangre segn las indicaciones de su mdico. Es comn realizar controles frecuentes de la glucosa en la sangre.  Supervise las cetonas en la orina cuando est enferma y segn las indicaciones de su mdico.  Tome el medicamento para la diabetes y adminstrese insulina segn las indicaciones de su mdico para mantener el nivel de glucosa en la sangre en el rango deseado.  Nunca se quede sin medicamento para la diabetes o sin insulina. Es necesario que la reciba todos los das.  Ajuste la insulina segn la ingesta de hidratos de carbono. Los hidratos de carbono pueden aumentar los niveles de glucosa en la sangre, pero deben incluirse en su dieta. Los hidratos de carbono aportan vitaminas, minerales y fibra que son una parte esencial de una dieta saludable. Los hidratos de carbono se encuentran en frutas, verduras, cereales integrales, productos lcteos, legumbres y alimentos que contienen azcares aadidos.  Consuma alimentos saludables. Alterne 3 comidas con 3 colaciones.  Aumente de peso saludablemente. El aumento del peso total vara de acuerdo con el ndice de masa corporal que tena antes del embarazo ([facility]).  Lleve una tarjeta de alerta mdica o use una pulsera o medalla de alerta mdica.  Lleve con usted una colacin de 15gramos de hidratos de carbono en todo momento para controlar los niveles bajos de glucosa en la sangre (hipoglucemia). Algunos ejemplos de colaciones de 15gramos de hidratos de carbono son los siguientes:  Tabletas de glucosa, 3 o 4.  Gel  de glucosa, tubo de 15 gramos.  Pasas de uva, 2 cucharadas (24 g).  Caramelos de goma, 6.  Galletas de animales, 8.  Jugo de fruta, gaseosa comn, o leche descremada, 4 onzas (120 ml).  Pastillas de goma, 9.  Reconocer la hipoglucemia. Durante el embarazo la hipoglucemia se produce cuando hay niveles de glucosa en la sangre de 60 mg/dl o menos. El riesgo de hipoglucemia aumenta durante el ayuno o cuando se saltea las comidas, durante o despus de realizar ejercicio intenso y mientras duerme. Los sntomas de hipoglucemia son:  Temblores o sacudidas.  Disminucin de la capacidad de concentracin.  Sudoracin.  Aumento de la frecuencia cardaca.  Dolor de cabeza.  Sequedad en la boca.  Hambre.  Irritabilidad.  Ansiedad.  Sueo agitado.  Alteracin del habla o de la coordinacin.  Confusin.  Tratar la hipoglucemia rpidamente. Si usted est alerta y puede tragar con seguridad, siga la regla de 15/15 que consiste en:  Tome entre 15 y 20gramos de glucosa de accin rpida o carbohidratos. Las opciones de accin rpida son un gel de glucosa, tabletas de glucosa, o 4 onzas (120 ml) de jugo de frutas, gaseosa comn, o leche baja en grasa.  Compruebe su nivel de glucosa en la sangre   15 minutos despus de tomar la glucosa.  Tome entre 15 y 20 gramos ms de glucosa si el nivel de glucosa en la sangre todava es de 70mg/dl o inferior.  Ingiera una comida o una colacin en el lapso de 1 hora una vez que los niveles de glucosa en la sangre vuelven a la normalidad.  Est atento a la poliuria (miccin excesiva) y la polidipsia (sensacin de mucha sed), que son los primeros signos de la hiperglucemia. El reconocimiento temprano de la hiperglucemia permite un tratamiento oportuno. Trate la hiperglucemia segn le indic su mdico.  Haga actividad fsica por lo menos 30minutos al da o como lo indique su mdico. Se recomienda que 30 minutos despus de cada comida, realice diez minutos  de actividad fsica para controlar los niveles de glucosa postprandial en la sangre.  Ajuste su dosis de insulina y la ingesta de alimentos, segn sea necesario, si inicia un nuevo ejercicio o deporte.  Siga su plan para los das de enfermedad cuando no pueda comer o beber como de costumbre.  Evite el tabaco y el alcohol.  Concurra a todas las visitas de control como se lo haya indicado el mdico.  Siga el consejo del mdico respecto a los controles prenatales y posteriores al parto (postparto), las visitas, la planificacin de las comidas, el ejercicio, los medicamentos, las vitaminas, los anlisis de sangre, otras pruebas mdicas y actividades fsicas.  Realice diariamente el cuidado de la piel y de los pies. Examine su piel y los pies diariamente para ver si tiene cortes, moretones, enrojecimiento, problemas en las uas, sangrado, ampollas o llagas.  Cepllese los dientes y encas por lo menos dos veces al da y use hilo dental al menos una vez por da. Concurra regularmente a las visitas de control con el dentista.  Programe un examen de vista durante el primer trimestre de su embarazo o como lo indique su mdico.  Comparta su plan de control de diabetes en el trabajo o en la escuela.  Mantngase al da con las vacunas.  Aprenda a manejar el estrs.  Obtenga la mayor cantidad posible de informacin sobre la diabetes y solicite ayuda siempre que sea necesario.  Obtenga informacin sobre el amamantamiento y analice esta posibilidad.  Debe controlar el nivel de azcar en la sangre de 6a 12semanas despus del parto. Esto se hace con una prueba de tolerancia a la glucosa oral (PTGO). SOLICITE ATENCIN MDICA SI:   No puede comer alimentos o beber por ms de 6 horas.  Tuvo nuseas o ha vomitado durante ms de 6 horas.  Tiene un nivel de glucosa en la sangre de 200 mg/dl y cetonas en la orina.  Presenta algn cambio en el estado mental.  Desarrolla problemas de visin.  Sufre  un dolor persistente de cabeza.  Siente dolor o molestias en la parte superior del abdomen.  Desarrolla una enfermedad grave adicional.  Tuvo diarrea durante ms de 6 horas.  Ha estado enfermo o ha tenido fiebre durante un par de das y no mejora. SOLICITE ATENCIN MDICA DE INMEDIATO SI:   Tiene dificultad para respirar.  Ya no siente los movimientos del beb.  Est sangrando o tiene flujo vaginal.  Comienza a tener contracciones o trabajo de parto prematuro. ASEGRESE DE QUE:  Comprende estas instrucciones.  Controlar su afeccin.  Recibir ayuda de inmediato si no mejora o si empeora.   Esta informacin no tiene como fin reemplazar el consejo del mdico. Asegrese de hacerle al mdico cualquier pregunta que tenga.     Document Released: 10/08/2004 Document Revised: 01/19/2014 Elsevier Interactive Patient Education [employer]   Gestational Diabetes Mellitus Gestational diabetes mellitus, often simply referred to as gestational diabetes, is a type of diabetes that some women develop during pregnancy. In gestational diabetes, the pancreas does not make enough insulin (a hormone), the cells are less responsive to the insulin that is made (insulin resistance), or both.Normally, insulin moves sugars from food into the tissue cells. The tissue cells use the sugars for energy. The lack of insulin or the lack of normal response to insulin causes excess sugars to build up in the blood instead of going into the tissue cells. As a result, high blood sugar (hyperglycemia) develops. The effect of high sugar (glucose) levels can cause many problems.  RISK FACTORS You have an increased chance of developing gestational diabetes if you have a family history of diabetes and also have one or more of the following risk factors:  A body mass index over 30 (obesity).  A previous pregnancy with gestational diabetes.  An older age at the time of pregnancy. If blood glucose levels are  kept in the normal range during pregnancy, women can have a healthy pregnancy. If your blood glucose levels are not well controlled, there may be risks to you, your unborn baby (fetus), your labor and delivery, or your newborn baby.  SYMPTOMS  If symptoms are experienced, they are much like symptoms you would normally expect during pregnancy. The symptoms of gestational diabetes include:   Increased thirst (polydipsia).  Increased urination (polyuria).  Increased urination during the night (nocturia).  Weight loss. This weight loss may be rapid.  Frequent, recurring infections.  Tiredness (fatigue).  Weakness.  Vision changes, such as blurred vision.  Fruity smell to your breath.  Abdominal pain. DIAGNOSIS Diabetes is diagnosed when blood glucose levels are increased. Your blood glucose level may be checked by one or more of the following blood tests:  A fasting blood glucose test. You will not be allowed to eat for at least 8 hours before a blood sample is taken.  A random blood glucose test. Your blood glucose is checked at any time of the day regardless of when you ate.  An oral glucose tolerance test (OGTT). Your blood glucose is measured after you have not eaten (fasted) for 1-3 hours and then after you drink a glucose-containing beverage. Since the hormones that cause insulin resistance are highest at about 24-28 weeks of a pregnancy, an OGTT is usually performed during that time. If you have risk factors, you may be screened for undiagnosed type 2 diabetes at your first prenatal visit. TREATMENT  Gestational diabetes should be managed first with diet and exercise. Medicines may be added only if they are needed.  You will need to take diabetes medicine or insulin daily to keep blood glucose levels in the desired range.  You will need to match insulin dosing with exercise and healthy food choices. If you have gestational diabetes, your treatment goal is to maintain the  following blood glucose levels:  Before meals (preprandial): at or below 95 mg/dL.  After meals (postprandial):  One hour after a meal: at or below 140 mg/dL.  Two hours after a meal: at or below 120 mg/dL. If you have pre-existing type 1 or type 2 diabetes, your treatment goal is to maintain the following blood glucose levels:  Before meals, at bedtime, and overnight: 60-99 mg/dL.  After meals: peak of 100-129 mg/dL. HOME CARE INSTRUCTIONS   Have your  hemoglobin A1c level checked twice a year.  Perform daily blood glucose monitoring as directed by your health care provider. It is common to perform frequent blood glucose monitoring.  Monitor urine ketones when you are ill and as directed by your health care provider.  Take your diabetes medicine and insulin as directed by your health care provider to maintain your blood glucose level in the desired range.  Never run out of diabetes medicine or insulin. It is needed every day.  Adjust insulin based on your intake of carbohydrates. Carbohydrates can raise blood glucose levels but need to be included in your diet. Carbohydrates provide vitamins, minerals, and fiber which are an essential part of a healthy diet. Carbohydrates are found in fruits, vegetables, whole grains, dairy products, legumes, and foods containing added sugars.  Eat healthy foods. Alternate 3 meals with 3 snacks.  Maintain a healthy weight gain. The usual total expected weight gain varies according to your prepregnancy body mass index (BMI).  Carry a medical alert card or wear your medical alert jewelry.  Carry a 15-gram carbohydrate snack with you at all times to treat low blood glucose (hypoglycemia). Some examples of 15-gram carbohydrate snacks include:  Glucose tablets, 3 or 4.  Glucose gel, 15-gram tube.  Raisins, 2 tablespoons (24 g).  Jelly beans, 6.  Animal crackers, 8.  Fruit juice, regular soda, or low-fat milk, 4 ounces (120 mL).  Gummy  treats, 9.  Recognize hypoglycemia. Hypoglycemia during pregnancy occurs with blood glucose levels of 60 mg/dL and below. The risk for hypoglycemia increases when fasting or skipping meals, during or after intense exercise, and during sleep. Hypoglycemia symptoms can include:  Tremors or shakes.  Decreased ability to concentrate.  Sweating.  Increased heart rate.  Headache.  Dry mouth.  Hunger.  Irritability.  Anxiety.  Restless sleep.  Altered speech or coordination.  Confusion.  Treat hypoglycemia promptly. If you are alert and able to safely swallow, follow the 15:15 rule:  Take 15-20 grams of rapid-acting glucose or carbohydrate. Rapid-acting options include glucose gel, glucose tablets, or 4 ounces (120 mL) of fruit juice, regular soda, or low-fat milk.  Check your blood glucose level 15 minutes after taking the glucose.  Take 15-20 grams more of glucose if the repeat blood glucose level is still 70 mg/dL or below.  Eat a meal or snack within 1 hour once blood glucose levels return to normal.  Be alert to polyuria (excess urination) and polydipsia (excess thirst) which are early signs of hyperglycemia. An early awareness of hyperglycemia allows for prompt treatment. Treat hyperglycemia as directed by your health care provider.  Engage in at least 30 minutes of physical activity a day or as directed by your health care provider. Ten minutes of physical activity timed 30 minutes after each meal is encouraged to control postprandial blood glucose levels.  Adjust your insulin dosing and food intake as needed if you start a new exercise or sport.  Follow your sick-day plan at any time you are unable to eat or drink as usual.  Avoid tobacco and alcohol use.  Keep all follow-up visits as directed by your health care provider.  Follow the advice of your health care provider regarding your prenatal and post-delivery (postpartum) appointments, meal planning, exercise,  medicines, vitamins, blood tests, other medical tests, and physical activities.  Perform daily skin and foot care. Examine your skin and feet daily for cuts, bruises, redness, nail problems, bleeding, blisters, or sores.  Brush your teeth and gums at  least twice a day and floss at least once a day. Follow up with your dentist regularly.  Schedule an eye exam during the first trimester of your pregnancy or as directed by your health care provider.  Share your diabetes management plan with your workplace or school.  Stay up-to-date with immunizations.  Learn to manage stress.  Obtain ongoing diabetes education and support as needed.  Learn about and consider breastfeeding your baby.  You should have your blood sugar level checked 6-12 weeks after delivery. This is done with an oral glucose tolerance test (OGTT). SEEK MEDICAL CARE IF:   You are unable to eat food or drink fluids for more than 6 hours.  You have nausea and vomiting for more than 6 hours.  You have a blood glucose level of 200 mg/dL and you have ketones in your urine.  There is a change in mental status.  You develop vision problems.  You have a persistent headache.  You have upper abdominal pain or discomfort.  You develop an additional serious illness.  You have diarrhea for more than 6 hours.  You have been sick or have had a fever for a couple of days and are not getting better. SEEK IMMEDIATE MEDICAL CARE IF:   You have difficulty breathing.  You no longer feel the baby moving.  You are bleeding or have discharge from your vagina.  You start having premature contractions or labor. MAKE SURE YOU:  Understand these instructions.  Will watch your condition.  Will get help right away if you are not doing well or get worse.   This information is not intended to replace advice given to you by your health care provider. Make sure you discuss any questions you have with your health care provider.    Document Released: 04/06/2000 Document Revised: 01/19/2014 Document Reviewed: 07/28/2011 Else[employer] Interactive Patient Education [employer]

## 2015-08-29 ENCOUNTER — Encounter: Payer: Managed Care, Other (non HMO) | Attending: Obstetrics and Gynecology | Admitting: *Deleted

## 2015-08-29 ENCOUNTER — Encounter: Payer: Self-pay | Admitting: *Deleted

## 2015-08-29 VITALS — BP 96/62 | Ht 63.0 in | Wt 157.5 lb

## 2015-08-29 DIAGNOSIS — Z6827 Body mass index (BMI) 27.0-27.9, adult: Secondary | ICD-10-CM | POA: Diagnosis not present

## 2015-08-29 DIAGNOSIS — O24410 Gestational diabetes mellitus in pregnancy, diet controlled: Secondary | ICD-10-CM

## 2015-08-29 DIAGNOSIS — Z713 Dietary counseling and surveillance: Secondary | ICD-10-CM | POA: Diagnosis not present

## 2015-08-29 DIAGNOSIS — O24419 Gestational diabetes mellitus in pregnancy, unspecified control: Secondary | ICD-10-CM | POA: Insufficient documentation

## 2015-08-29 NOTE — Progress Notes (Signed)
Diabetes Self-Management Education  Visit Type: First/Initial  Appt. Start Time: 1325 Appt. End Time: 1515  08/29/2015  [name], identified by name and date of birth, is a 40 y.o. female with a diagnosis of Diabetes: Gestational Diabetes.   ASSESSMENT  Blood pressure 96/62, height 5\' 3"  (1.6 m), weight 157 lb 8 oz (71.4 kg), last menstrual period 01/10/2015. Body mass index is 27.9 kg/m.      Diabetes Self-Management Education - 08/29/15 1553      Visit Information   Visit Type First/Initial     Initial Visit   Diabetes Type Gestational Diabetes   Are you currently following a meal plan? Yes   What type of meal plan do you follow? "changing food habits"  due to family member's high triglycerides   Are you taking your medications as prescribed? Yes   Date Diagnosed "unsure" - reports she was told she had pre-diabetes 4 years ago     Health Coping   How would you rate your overall health? Fair     Psychosocial Assessment   Patient Belief/Attitude about Diabetes Afraid  "it worries me" She was crying during appointment.    Self-care barriers None   Self-management support Doctor's office;Family   Other persons present Family Member  39 year old son   Patient Concerns Nutrition/Meal planning;Glycemic Control   Special Needs None   Preferred Learning Style Hands on   Learning Readiness Change in progress   How often do you need to have someone help you when you read instructions, pamphlets, or other written materials from your doctor or pharmacy? 1 - Never   What is the last grade level you completed in school? 11th - patient knows [place] and Spanish     Pre-Education Assessment   Patient understands the diabetes disease and treatment process. Needs Instruction   Patient understands incorporating nutritional management into lifestyle. Needs Instruction   Patient undertands incorporating physical activity into lifestyle. Needs Instruction   Patient understands  using medications safely. Needs Instruction   Patient understands monitoring blood glucose, interpreting and using results Needs Instruction   Patient understands prevention, detection, and treatment of acute complications. Needs Instruction   Patient understands prevention, detection, and treatment of chronic complications. Needs Instruction   Patient understands how to develop strategies to address psychosocial issues. Needs Instruction   Patient understands how to develop strategies to promote health/change behavior. Needs Instruction     Complications   How often do you check your blood sugar? 0 times/day (not testing)  Provided Accu-Chek guide meter and instructed on use. BG upon return demonstration was 112 mg/dL at 1:613:05 pm - 3 1/2 hrs pp.   Have you had a dilated eye exam in the past 12 months? No   Have you had a dental exam in the past 12 months? No   Are you checking your feet? No     Dietary Intake   Breakfast 9:00 am - burrito with eggs or sandwich or occasional biscuit   Snack (morning) 8:00 am - cookie or granola bar or fruit   Lunch chicken, beef or pork with rice, salad or soup   Dinner same as lunch   Beverage(s) water, milk, occasional juice and regular soda     Exercise   Exercise Type ADL's     Patient Education   Previous Diabetes Education No   Disease state  Definition of diabetes, type 1 and 2, and the diagnosis of diabetes   Nutrition management  Role  of diet in the treatment of diabetes and the relationship between the three main macronutrients and blood glucose level   Physical activity and exercise  Role of exercise on diabetes management, blood pressure control and cardiac health.   Medications Other (comment)  Potential for oral medication and insulin   Monitoring Taught/evaluated SMBG meter.;Purpose and frequency of SMBG.;Identified appropriate SMBG and/or A1C goals.;Ketone testing, when, how.   Chronic complications Relationship between chronic  complications and blood glucose control   Psychosocial adjustment Identified and addressed patients feelings and concerns about diabetes   Preconception care Pregnancy and GDM  Role of pre-pregnancy blood glucose control on the development of the fetus;Reviewed with patient blood glucose goals with pregnancy;Role of family planning for patients with diabetes     Individualized Goals (developed by patient)   Reducing Risk Improve blood sugars     Outcomes   Expected Outcomes Demonstrated interest in learning. Expect positive outcomes   Future DMSE 2 wks      Individualized Plan for Diabetes Self-Management Training:   Learning Objective:  Patient will have a greater understanding of diabetes self-management. Patient education plan is to attend individual and/or group sessions per assessed needs and concerns.   Plan:   Patient Instructions  Read booklet on Gestational Diabetes Follow Gestational Meal Planning Guidelines Complete a 3 Day Food Record and bring to next appointment Avoid fruit for breakfast and fruit juices Avoid sugar sweetened sodas Check blood sugars 4 x day - before breakfast and 2 hrs after every meal and record  Bring blood sugar log to all appointments Call MD for prescription for meter strips and lancets Strips   Accu-Chek Guide Lancets   Accu-Chek FastClix Purchase urine ketone strips if blood sugars not controlled and check urine ketones every am:  If + increase bedtime snack to 1 protein and 2 carbohydrate servings Walk 20-30 minutes at least 5 x week if permitted by MD   Expected Outcomes:  Demonstrated interest in learning. Expect positive outcomes  Education material provided:  Gestational Booklet Gestational Meal Planning Guidelines Viewed Gestational Diabetes Video Meter - Accu-Chek Guide 3 Day Food Record Goals for a Healthy Pregnancy   If problems or questions, patient to contact team via:  Yesenia SettlerSheila Shotwell, RN, CCM, CDE [phone]    Future DSME appointment: 2 wks  September 09, 2015 for appointment with dietitian

## 2015-08-29 NOTE — Patient Instructions (Signed)
Read booklet on Gestational Diabetes Follow Gestational Meal Planning Guidelines Complete a 3 Day Food Record and bring to next appointment Avoid fruit for breakfast and fruit juices Avoid sugar sweetened sodas Check blood sugars 4 x day - before breakfast and 2 hrs after every meal and record  Bring blood sugar log to all appointments Call MD for prescription for meter strips and lancets Strips   Accu-Chek Guide Lancets   Accu-Chek FastClix Purchase urine ketone strips if blood sugars not controlled and check urine ketones every am:  If + increase bedtime snack to 1 protein and 2 carbohydrate servings Walk 20-30 minutes at least 5 x week if permitted by MD

## 2015-08-30 ENCOUNTER — Telehealth: Payer: Self-pay | Admitting: Obstetrics and Gynecology

## 2015-08-30 ENCOUNTER — Other Ambulatory Visit: Payer: Self-pay | Admitting: Obstetrics and Gynecology

## 2015-08-30 MED ORDER — GLUCOSE BLOOD VI STRP: ORAL_STRIP | 12 refills | Status: DC

## 2015-08-30 MED ORDER — ACCU-CHEK SOFTCLIX LANCET DEV MISC: 1 refills | Status: DC

## 2015-08-30 NOTE — Telephone Encounter (Signed)
Patient called stating she needs accu check lancets and test strips [facility] [place].Thanks

## 2015-09-02 NOTE — Telephone Encounter (Signed)
Done-ac 

## 2015-09-03 ENCOUNTER — Ambulatory Visit (INDEPENDENT_AMBULATORY_CARE_PROVIDER_SITE_OTHER): Payer: Managed Care, Other (non HMO) | Admitting: Obstetrics and Gynecology

## 2015-09-03 VITALS — BP 109/71 | HR 63 | Wt 157.7 lb

## 2015-09-03 DIAGNOSIS — Z3493 Encounter for supervision of normal pregnancy, unspecified, third trimester: Secondary | ICD-10-CM

## 2015-09-03 LAB — POCT URINALYSIS DIPSTICK
Bilirubin, UA: NEGATIVE
Blood, UA: NEGATIVE
GLUCOSE UA: NEGATIVE
KETONES UA: NEGATIVE
LEUKOCYTES UA: NEGATIVE
Nitrite, UA: NEGATIVE
PROTEIN UA: NEGATIVE
SPEC GRAV UA: 1.015
Urobilinogen, UA: 0.2
pH, UA: 6.5

## 2015-09-03 NOTE — Patient Instructions (Signed)
Diabetes mellitus gestacional (Gestational Diabetes Mellitus) La diabetes mellitus gestacional, ms comnmente conocida como diabetes gestacional es un tipo de diabetes que desarrollan algunas mujeres durante el embarazo. En la diabetes gestacional, el pncreas no produce suficiente insulina (una hormona) o las clulas son menos sensibles a la insulina producida (resistencia a la insulina), o ambas cosas. Normalmente, la insulina mueve los azcares de los alimentos a las clulas de los tejidos. Las clulas de los tejidos utilizan los azcares para obtener energa. La falta de insulina o la falta de una respuesta normal a la insulina hace que el exceso de azcar se acumule en la sangre en lugar de penetrar en las clulas de los tejidos. Como resultado, se producen niveles altos de azcar en la sangre (hiperglucemia). El efecto de los niveles altos de azcar (glucosa) puede causar muchos problemas.  FACTORES DE RIESGO Usted tiene mayor probabilidad de desarrollar diabetes gestacional si tiene antecedentes familiares de diabetes y tambin si tiene uno o ms de los siguientes factores de riesgo:  ndice de masa corporal superior a 30 (obesidad).  Embarazo previo con diabetes gestacional.  La edad avanzada en el momento del embarazo. Si se mantienen los niveles de glucosa en la sangre en un rango normal durante el embarazo, las mujeres pueden tener un embarazo saludable. Si los niveles de glucosa en la sangre no estn bien controlados, puede haber riesgos para usted, el feto o el recin nacido, o durante el trabajo de parto y el parto.  SNTOMAS  Si se presentan sntomas, stos son similares a los sntomas que normalmente experimentar durante el embarazo. Los sntomas de la diabetes gestacional son:   Aumento de la sed (polidipsia).  Aumento de la miccin (poliuria).  Orina con ms frecuencia durante la noche (nocturia).  Prdida de peso. La prdida de peso puede ser muy rpida.  Infecciones  frecuentes y recurrentes.  Cansancio (fatiga).  Debilidad.  Cambios en la visin, como visin borrosa.  Olor a fruta en el aliento.  Dolor abdominal. DIAGNSTICO La diabetes se diagnostica cuando hay aumento de los niveles de glucosa en la sangre. El nivel de glucosa en la sangre puede controlarse en uno o ms de los siguientes anlisis de sangre:  Medicin de glucosa en la sangre en ayunas. No se le permitir comer durante al menos 8 horas antes de que se tome una muestra de sangre.  Pruebas al azar de glucosa en la sangre. El nivel de glucosa en la sangre se controla en cualquier momento del da sin importar el momento en que haya comido.  Prueba de tolerancia a la glucosa oral (PTGO). La glucosa en la sangre se mide despus de no haber comido (ayunas) durante una a tres horas y despus de beber una bebida que contenga glucosa. Dado que las hormonas que causan la resistencia a la insulina son ms altas alrededor de las semanas 24 a 28 de embarazo, generalmente se realiza una PTGO durante ese tiempo. Si tiene factores de riesgo, en la primera visita prenatal pueden hacerle pruebas de deteccin de diabetes tipo 2 no diagnosticada. TRATAMIENTO  La diabetes gestacional debe controlarse en primer lugar con dieta y ejercicios. Pueden agregarse medicamentos, pero solo si son necesarios.  Usted tendr que tomar medicamentos para la diabetes o insulina diariamente para mantener los niveles de glucosa en la sangre en el rango deseado.  Usted tendr que combinar la dosis de insulina con la actividad fsica y la eleccin de alimentos saludables. Si tiene diabetes gestacional, el objetivo del tratamiento ser   mantener los siguientes niveles sanguneos de glucosa:  Antes de las comidas (preprandial): valor de 95 mg/dl o inferior.  Despus de las comidas (posprandial):  Una hora despus de la comida: valor de 140 mg/dl o inferior.  Dos horas despus de la comida: valor de 120 mg/dl o  inferior. Si tiene diabetes tipo 1 o tipo 2 preexistente, el objetivo del tratamiento ser mantener los siguientes niveles sanguneos de glucosa:  Antes de las comidas, a la hora de acostarse y durante la noche: de 60 a 99 mg/dl.  Despus de las comidas: valor mximo de 100 a 129 mg/dl. INSTRUCCIONES PARA EL CUIDADO EN EL HOGAR   Controle su nivel de hemoglobina A1c dos veces al ao.  Contrlese a diario el nivel de glucosa en la sangre segn las indicaciones de su mdico. Es comn realizar controles frecuentes de la glucosa en la sangre.  Supervise las cetonas en la orina cuando est enferma y segn las indicaciones de su mdico.  Tome el medicamento para la diabetes y adminstrese insulina segn las indicaciones de su mdico para mantener el nivel de glucosa en la sangre en el rango deseado.  Nunca se quede sin medicamento para la diabetes o sin insulina. Es necesario que la reciba todos los das.  Ajuste la insulina segn la ingesta de hidratos de carbono. Los hidratos de carbono pueden aumentar los niveles de glucosa en la sangre, pero deben incluirse en su dieta. Los hidratos de carbono aportan vitaminas, minerales y fibra que son una parte esencial de una dieta saludable. Los hidratos de carbono se encuentran en frutas, verduras, cereales integrales, productos lcteos, legumbres y alimentos que contienen azcares aadidos.  Consuma alimentos saludables. Alterne 3 comidas con 3 colaciones.  Aumente de peso saludablemente. El aumento del peso total vara de acuerdo con el ndice de masa corporal que tena antes del embarazo ([facility]).  Lleve una tarjeta de alerta mdica o use una pulsera o medalla de alerta mdica.  Lleve con usted una colacin de 15gramos de hidratos de carbono en todo momento para controlar los niveles bajos de glucosa en la sangre (hipoglucemia). Algunos ejemplos de colaciones de 15gramos de hidratos de carbono son los siguientes:  Tabletas de glucosa, 3 o 4.  Gel  de glucosa, tubo de 15 gramos.  Pasas de uva, 2 cucharadas (24 g).  Caramelos de goma, 6.  Galletas de animales, 8.  Jugo de fruta, gaseosa comn, o leche descremada, 4 onzas (120 ml).  Pastillas de goma, 9.  Reconocer la hipoglucemia. Durante el embarazo la hipoglucemia se produce cuando hay niveles de glucosa en la sangre de 60 mg/dl o menos. El riesgo de hipoglucemia aumenta durante el ayuno o cuando se saltea las comidas, durante o despus de realizar ejercicio intenso y mientras duerme. Los sntomas de hipoglucemia son:  Temblores o sacudidas.  Disminucin de la capacidad de concentracin.  Sudoracin.  Aumento de la frecuencia cardaca.  Dolor de cabeza.  Sequedad en la boca.  Hambre.  Irritabilidad.  Ansiedad.  Sueo agitado.  Alteracin del habla o de la coordinacin.  Confusin.  Tratar la hipoglucemia rpidamente. Si usted est alerta y puede tragar con seguridad, siga la regla de 15/15 que consiste en:  Tome entre 15 y 20gramos de glucosa de accin rpida o carbohidratos. Las opciones de accin rpida son un gel de glucosa, tabletas de glucosa, o 4 onzas (120 ml) de jugo de frutas, gaseosa comn, o leche baja en grasa.  Compruebe su nivel de glucosa en la sangre   15 minutos despus de tomar la glucosa.  Tome entre 15 y 20 gramos ms de glucosa si el nivel de glucosa en la sangre todava es de 70mg/dl o inferior.  Ingiera una comida o una colacin en el lapso de 1 hora una vez que los niveles de glucosa en la sangre vuelven a la normalidad.  Est atento a la poliuria (miccin excesiva) y la polidipsia (sensacin de mucha sed), que son los primeros signos de la hiperglucemia. El reconocimiento temprano de la hiperglucemia permite un tratamiento oportuno. Trate la hiperglucemia segn le indic su mdico.  Haga actividad fsica por lo menos 30minutos al da o como lo indique su mdico. Se recomienda que 30 minutos despus de cada comida, realice diez minutos  de actividad fsica para controlar los niveles de glucosa postprandial en la sangre.  Ajuste su dosis de insulina y la ingesta de alimentos, segn sea necesario, si inicia un nuevo ejercicio o deporte.  Siga su plan para los das de enfermedad cuando no pueda comer o beber como de costumbre.  Evite el tabaco y el alcohol.  Concurra a todas las visitas de control como se lo haya indicado el mdico.  Siga el consejo del mdico respecto a los controles prenatales y posteriores al parto (postparto), las visitas, la planificacin de las comidas, el ejercicio, los medicamentos, las vitaminas, los anlisis de sangre, otras pruebas mdicas y actividades fsicas.  Realice diariamente el cuidado de la piel y de los pies. Examine su piel y los pies diariamente para ver si tiene cortes, moretones, enrojecimiento, problemas en las uas, sangrado, ampollas o llagas.  Cepllese los dientes y encas por lo menos dos veces al da y use hilo dental al menos una vez por da. Concurra regularmente a las visitas de control con el dentista.  Programe un examen de vista durante el primer trimestre de su embarazo o como lo indique su mdico.  Comparta su plan de control de diabetes en el trabajo o en la escuela.  Mantngase al da con las vacunas.  Aprenda a manejar el estrs.  Obtenga la mayor cantidad posible de informacin sobre la diabetes y solicite ayuda siempre que sea necesario.  Obtenga informacin sobre el amamantamiento y analice esta posibilidad.  Debe controlar el nivel de azcar en la sangre de 6a 12semanas despus del parto. Esto se hace con una prueba de tolerancia a la glucosa oral (PTGO). SOLICITE ATENCIN MDICA SI:   No puede comer alimentos o beber por ms de 6 horas.  Tuvo nuseas o ha vomitado durante ms de 6 horas.  Tiene un nivel de glucosa en la sangre de 200 mg/dl y cetonas en la orina.  Presenta algn cambio en el estado mental.  Desarrolla problemas de visin.  Sufre  un dolor persistente de cabeza.  Siente dolor o molestias en la parte superior del abdomen.  Desarrolla una enfermedad grave adicional.  Tuvo diarrea durante ms de 6 horas.  Ha estado enfermo o ha tenido fiebre durante un par de das y no mejora. SOLICITE ATENCIN MDICA DE INMEDIATO SI:   Tiene dificultad para respirar.  Ya no siente los movimientos del beb.  Est sangrando o tiene flujo vaginal.  Comienza a tener contracciones o trabajo de parto prematuro. ASEGRESE DE QUE:  Comprende estas instrucciones.  Controlar su afeccin.  Recibir ayuda de inmediato si no mejora o si empeora.   Esta informacin no tiene como fin reemplazar el consejo del mdico. Asegrese de hacerle al mdico cualquier pregunta que tenga.     Document Released: 10/08/2004 Document Revised: 01/19/2014 Elsevier Interactive Patient Education 2016 [employer]   [place] BrunoLigadura de trompas posparto (Postpartum Tubal Ligation) La ligadura de trompas posparto es un procedimiento que se realiza para cerrar las trompas de [place] inmediatamente despus del [place]parto, o uno o dosdas despus. La ligadura de trompas posparto se realiza antes de que el tero recupere su posicin normal. El procedimiento tambin se denomina minilaparotoma. Cuando las trompas de NordstromF[place]opio se Music therapistcierran, los vulos que United States Steel Corporationliberan los ovarios no pueden ingresar al tero, y los espermatozoides no Tree surgeonpueden llegar a los vulos. La ligadura de trompas posparto se realiza para Environmental health practitionerevitar los embarazos. Si bien este procedimiento puede revertirse, debe considerarse permanente e irreversible. Si usted desea quedar embarazada en el futuro, no debe realizarse este procedimiento. INFORME A SU MDICO:  Cualquier alergia que tenga.  Todos los Walt Disneymedicamentos que utiliza, incluidos vitaminas, hierbas, gotas oftlmicas, cremas y [address] Stmedicamentos de [address]venta libre. Esto incluye cualquier clase de corticoide, ya sea por va oral o en crema.  Problemas previos que usted o los  Graybar Electricmiembros de su familia hayan tenido con el uso de anestsicos.  Enfermedades de la sangre que tenga.  Si tiene cirugas previas.  Si tiene Owens-Illinoisalguna enfermedad. RIESGOS Y COMPLICACIONES  Infeccin.  Hemorragia.  Lesin en los rganos circundantes.  Efectos secundarios de los anestsicos.  Fallas en el procedimiento.  Embarazo ectpico.  Arrepentimiento posterior por haberse realizado el procedimiento. ANTES DEL PROCEDIMIENTO  Quizs deba firmar ciertos documentos, incluido un formulario de consentimiento informado, hasta 30das antes de la fecha de la ligadura de trompas.  Siga las indicaciones del mdico respecto de las restricciones para las comidas y las bebidas. PROCEDIMIENTO  Si se realiza 1 o 2 das despus del parto vaginal:  Podrn administrarle uno o ms de los siguientes medicamentos:  Un medicamento para ayudarla a Lexicographerrelajarse (sedante).  Un medicamento que adormece el rea (anestesia local).  Un medicamento que la har dormir (anestesia general).  Un medicamento que se inyecta en una zona del cuerpo para adormecer toda la regin que se encuentra por debajo del lugar de la inyeccin (anestesia regional).  Si le administran anestesia general, le introducirn un tubo en la garganta para ayudarla a respirar.  Es posible que le vacen la vejiga con una pequea sonda (catter).  Le harn un pequeo corte (incisin) justo por encima de la lnea del vello pbico.  A travs de la incisin, las trompas se Turkmen[place]ubicarn y se subirn.  Luego, se atarn o quemarn (cauterizarn), o se cerrarn con un clip, un anillo o una abrazadera. En muchos casos, tambin se eliminar una pequea parte en el centro de cada trompa de [place].  La incisin se cerrar con puntos (suturas).  Se colocar un vendaje (apsito) sobre la incisin. Este procedimiento puede variar segn el mdico y el hospital. Si se realiza despus de una cesrea:  La ligadura de trompas se har a travs de la  misma incisin del parto por cesrea.  Despus de obstruir las trompas, la incisin se cerrar con puntos (suturas).  Se colocar un vendaje (apsito) sobre la incisin. Este procedimiento puede variar segn el mdico y el hospital. DESPUS DEL PROCEDIMIENTO  Conley RollsLe controlarn con frecuencia la presin arterial, la frecuencia cardaca, la frecuencia respiratoria y Air cabin crewel nivel de oxgeno en la sangre hasta que haya desaparecido el efecto de los medicamentos administrados.  Le darn medicamentos para el dolor si los necesita.  Si le administraron anestesia general, puede tener algunas molestias leves en la garganta. Esto  se debe al tubo que le colocaron en la garganta para poder respirar mientras dorma.  Puede sentirse cansada, y es conveniente que haga reposo durante el resto del da.  Puede sentir algo de dolor o calambres en el rea abdominal durante 3a 7das.   Esta informacin no tiene Theme park managercomo fin reemplazar el consejo del mdico. Asegrese de hacerle al mdico cualquier pregunta que tenga.   Document Released: 10/08/2004 Document Revised: 05/15/2014 Elsevier Interactive Patient Education [employer]2016 Elsevier Inc.

## 2015-09-03 NOTE — Progress Notes (Signed)
ROB- FBS all <90 and PP all but one < 115, feeling ok.reviewed foods and diet. Discussed birth control, considering tubal, and circumcision. Will do growth scan in 2 weeks and 6 weeks,

## 2015-09-03 NOTE — Progress Notes (Signed)
ROB- pt is doing well, states she feels like she is hungry, not sure what foods to eat

## 2015-09-09 ENCOUNTER — Encounter: Payer: Managed Care, Other (non HMO) | Admitting: Dietician

## 2015-09-09 ENCOUNTER — Encounter: Payer: Self-pay | Admitting: Dietician

## 2015-09-09 VITALS — BP 94/60 | Ht 63.0 in | Wt 156.3 lb

## 2015-09-09 DIAGNOSIS — O24410 Gestational diabetes mellitus in pregnancy, diet controlled: Secondary | ICD-10-CM

## 2015-09-09 DIAGNOSIS — Z713 Dietary counseling and surveillance: Secondary | ICD-10-CM | POA: Diagnosis not present

## 2015-09-09 NOTE — Progress Notes (Signed)
   Patient's BG record indicates BGs are mostly within goal ranges; 3-4 elevated readings most likely due to consuming sugar-sweetened beverages.  Patient's food diary indicates some variance with carbohydrate intake.    Provided 1800kcal meal plan, and wrote individualized menus based on patient's food preferences. Discussed specific carbohydrate needs and encouraged increase in protein or free vegetables if needed for satiety.   Instructed patient on food safety, including avoidance of Listeriosis, and limiting mercury from fish.  Discussed importance of maintaining healthy lifestyle habits to reduce risk of Type 2 DM as well as Gestational DM with any future pregnancies.  Advised patient to use any remaining testing supplies to test some BGs after delivery, and to have BG tested ideally annually, as well as prior to attempting future pregnancies.

## 2015-09-13 ENCOUNTER — Other Ambulatory Visit: Payer: Self-pay | Admitting: Obstetrics and Gynecology

## 2015-09-13 DIAGNOSIS — O24419 Gestational diabetes mellitus in pregnancy, unspecified control: Secondary | ICD-10-CM

## 2015-09-17 ENCOUNTER — Ambulatory Visit (INDEPENDENT_AMBULATORY_CARE_PROVIDER_SITE_OTHER): Payer: Managed Care, Other (non HMO)

## 2015-09-17 ENCOUNTER — Ambulatory Visit (INDEPENDENT_AMBULATORY_CARE_PROVIDER_SITE_OTHER): Payer: Managed Care, Other (non HMO) | Admitting: Obstetrics and Gynecology

## 2015-09-17 VITALS — BP 101/63 | HR 60 | Wt 156.5 lb

## 2015-09-17 DIAGNOSIS — O24419 Gestational diabetes mellitus in pregnancy, unspecified control: Secondary | ICD-10-CM

## 2015-09-17 DIAGNOSIS — Z3493 Encounter for supervision of normal pregnancy, unspecified, third trimester: Secondary | ICD-10-CM

## 2015-09-17 LAB — POCT URINALYSIS DIPSTICK
Bilirubin, UA: NEGATIVE
Blood, UA: NEGATIVE
Glucose, UA: NEGATIVE
Ketones, UA: NEGATIVE
LEUKOCYTES UA: NEGATIVE
Nitrite, UA: NEGATIVE
PROTEIN UA: NEGATIVE
SPEC GRAV UA: 1.015
UROBILINOGEN UA: 0.2
pH, UA: 6.5

## 2015-09-17 NOTE — Progress Notes (Signed)
ROB and Growth scan:  FBS all <95, PP <125 except for 2 readings.  Indications:Growth, AFI for GDM Findings:  Singleton intrauterine pregnancy is visualized with FHR at 132 BPM. Biometrics give an (U/S) Gestational age of [redacted] weeks and an (U/S) EDD of 10/22/15; this correlates with the clinically established EDD of 10/26/15.  Fetal presentation is Vertex.  EFW: 2519g (5lb 9oz) Williams 49th percentile. Placenta: Anterior, Grade 1, remote to cervix. AFI: 13.7cm.  Impression: 1. 35 week Viable Singleton Intrauterine pregnancy by U/S. 2. (U/S) EDD is consistent with Clinically established (LMP) EDD of 10/26/15. 3. Adequate growth and AFI.

## 2015-09-17 NOTE — Progress Notes (Signed)
ROB- pt is doing well, states she is tired today

## 2015-09-25 ENCOUNTER — Telehealth: Payer: Self-pay | Admitting: Obstetrics and Gynecology

## 2015-09-25 NOTE — Telephone Encounter (Signed)
I will talk to her about it at next visit. Tell her to keep a food log between now and then

## 2015-09-25 NOTE — Telephone Encounter (Signed)
[facility]

## 2015-09-25 NOTE — Telephone Encounter (Signed)
Pt called and she is due 10/14 and she is a little worried because the last couple of days her sugars have been high, this morning it was 127 2 hours after she ate and she stated that is a little higher then her normal, she ate cereal and milk, she stated yesterday that she ate bread and mil for breakfast and it was over 120, so not sure if she needs to re meat with the nutrionist because it sounds like she is not eating the right stuff in order to keep her sugar under control.

## 2015-09-25 NOTE — Telephone Encounter (Signed)
Notified pt she voiced understanding 

## 2015-10-01 ENCOUNTER — Ambulatory Visit (INDEPENDENT_AMBULATORY_CARE_PROVIDER_SITE_OTHER): Payer: Managed Care, Other (non HMO) | Admitting: Obstetrics and Gynecology

## 2015-10-01 VITALS — BP 109/67 | HR 72 | Wt 156.9 lb

## 2015-10-01 DIAGNOSIS — Z3685 Encounter for antenatal screening for Streptococcus B: Secondary | ICD-10-CM

## 2015-10-01 DIAGNOSIS — Z36 Encounter for antenatal screening of mother: Secondary | ICD-10-CM

## 2015-10-01 DIAGNOSIS — Z3493 Encounter for supervision of normal pregnancy, unspecified, third trimester: Secondary | ICD-10-CM

## 2015-10-01 DIAGNOSIS — Z113 Encounter for screening for infections with a predominantly sexual mode of transmission: Secondary | ICD-10-CM

## 2015-10-01 LAB — POCT URINALYSIS DIPSTICK
Bilirubin, UA: NEGATIVE
Blood, UA: NEGATIVE
Glucose, UA: NEGATIVE
KETONES UA: NEGATIVE
Leukocytes, UA: NEGATIVE
Nitrite, UA: NEGATIVE
Protein, UA: NEGATIVE
Spec Grav, UA: 1.01
UROBILINOGEN UA: 0.2
pH, UA: 6

## 2015-10-01 NOTE — Progress Notes (Signed)
ROB- reports some fasting sugars >120 with last two 80-90, and some PP >140, associated with sweet bread and soda intake discussed needed changes. Desires stopping work a few weeks before delivery- will make last day this Friday. And anticipate IOL on 10/10 if not delivered by then.

## 2015-10-01 NOTE — Progress Notes (Signed)
ROB- cultures obtained today, pt has a lot of questions about what foods to be eating/ seems very sad

## 2015-10-04 LAB — GC/CHLAMYDIA PROBE AMP
CHLAMYDIA, DNA PROBE: NEGATIVE
Neisseria gonorrhoeae by PCR: NEGATIVE

## 2015-10-04 LAB — STREP GP B NAA: STREP GROUP B AG: NEGATIVE

## 2015-10-08 ENCOUNTER — Ambulatory Visit (INDEPENDENT_AMBULATORY_CARE_PROVIDER_SITE_OTHER): Payer: Managed Care, Other (non HMO) | Admitting: Obstetrics and Gynecology

## 2015-10-08 VITALS — BP 114/79 | HR 63 | Wt 156.8 lb

## 2015-10-08 DIAGNOSIS — Z3493 Encounter for supervision of normal pregnancy, unspecified, third trimester: Secondary | ICD-10-CM

## 2015-10-08 LAB — POCT URINALYSIS DIPSTICK
Bilirubin, UA: NEGATIVE
Glucose, UA: NEGATIVE
KETONES UA: NEGATIVE
Leukocytes, UA: NEGATIVE
Nitrite, UA: NEGATIVE
PH UA: 6.5
RBC UA: NEGATIVE
SPEC GRAV UA: 1.02
Urobilinogen, UA: 0.2

## 2015-10-08 NOTE — Progress Notes (Signed)
ROB- pt is having some contractions, some pelvic pressure 

## 2015-10-08 NOTE — Progress Notes (Signed)
ROB- doing well, FBS and PP all norma;l except one reading; IOL scheduled on 10/11- will place orders next visit

## 2015-10-10 ENCOUNTER — Encounter: Payer: Self-pay | Admitting: *Deleted

## 2015-10-10 ENCOUNTER — Observation Stay
Admission: AD | Admit: 2015-10-10 | Discharge: 2015-10-10 | Disposition: A | Discharge status: Home / Self Care | Payer: Managed Care, Other (non HMO) | Attending: Obstetrics and Gynecology | Admitting: Obstetrics and Gynecology

## 2015-10-10 DIAGNOSIS — O368130 Decreased fetal movements, third trimester, not applicable or unspecified: Secondary | ICD-10-CM | POA: Diagnosis present

## 2015-10-10 DIAGNOSIS — Z3A37 37 weeks gestation of pregnancy: Secondary | ICD-10-CM | POA: Diagnosis not present

## 2015-10-10 DIAGNOSIS — Z3490 Encounter for supervision of normal pregnancy, unspecified, unspecified trimester: Secondary | ICD-10-CM

## 2015-10-10 DIAGNOSIS — O368131 Decreased fetal movements, third trimester, fetus 1: Secondary | ICD-10-CM

## 2015-10-10 LAB — GLUCOSE, CAPILLARY: GLUCOSE-CAPILLARY: 78 mg/dL (ref 65–99)

## 2015-10-10 NOTE — Progress Notes (Signed)
Patient given discharge instructions including labor precautions and fetal kick count instructions.  Pt. Verbalized understanding. Pt. Left, ambulatory, in stable condition.

## 2015-10-11 ENCOUNTER — Other Ambulatory Visit: Payer: Self-pay | Admitting: Obstetrics and Gynecology

## 2015-10-11 DIAGNOSIS — O24419 Gestational diabetes mellitus in pregnancy, unspecified control: Secondary | ICD-10-CM

## 2015-10-11 NOTE — OB Triage Provider Note (Signed)
L&D OB Triage Note  Yesenia Davis is a 40 y.o. [id number] female at 5487w6d, EDD Estimated Date of Delivery: 10/26/15 who presented to triage for complaints of elevated blood sugar and decreased fetal movement. Called office nurse line and reported a PPBS over 300 and was told to come in for evaluation. She was evaluated by the nurses with no significant findings/findings significant for labor or fetal distress and BS was 78. Vital signs stable. An NST was performed and has been reviewed by myself. She was reassured and sent home.  NST INTERPRETATION: Indications: decreased fetal movement  Mode: External (EFM d/c'd for patient discharge home.) Baseline Rate (A): 130 bpm Variability: Moderate Accelerations: 15 x 15 Decelerations: None     Contraction Frequency (min): x1  Impression: reactive   Plan: NST performed was reviewed and was found to be reactive. She was discharged home with bleeding/labor precautions.  Continue routine prenatal care. Follow up with OB/GYN as previously scheduled.     Melvern Ramone Suzan NailerN Piper Hassebrock, CNM

## 2015-10-15 ENCOUNTER — Ambulatory Visit (INDEPENDENT_AMBULATORY_CARE_PROVIDER_SITE_OTHER): Payer: Managed Care, Other (non HMO)

## 2015-10-15 ENCOUNTER — Ambulatory Visit (INDEPENDENT_AMBULATORY_CARE_PROVIDER_SITE_OTHER): Payer: Managed Care, Other (non HMO) | Admitting: Obstetrics and Gynecology

## 2015-10-15 VITALS — BP 105/72 | HR 58 | Wt 159.1 lb

## 2015-10-15 DIAGNOSIS — Z3A38 38 weeks gestation of pregnancy: Secondary | ICD-10-CM

## 2015-10-15 DIAGNOSIS — O24419 Gestational diabetes mellitus in pregnancy, unspecified control: Secondary | ICD-10-CM | POA: Diagnosis not present

## 2015-10-15 LAB — POCT URINALYSIS DIPSTICK
BILIRUBIN UA: NEGATIVE
Glucose, UA: NEGATIVE
KETONES UA: NEGATIVE
Leukocytes, UA: NEGATIVE
Nitrite, UA: NEGATIVE
PH UA: 6
PROTEIN UA: NEGATIVE
RBC UA: NEGATIVE
Spec Grav, UA: 1.015
Urobilinogen, UA: 0.2

## 2015-10-15 NOTE — Progress Notes (Signed)
ROB- pt is having a lot of mucous, pelvic pressure, hasnt been checking her BS states" she has been to busy"

## 2015-10-15 NOTE — Progress Notes (Signed)
ROB-discussed previous delivery with PPH and fever; scared it will happen again (was IOL for >48hr)  Ultrasound today reveals: Indications:Growth and AFI for GDM Findings:  Singleton intrauterine pregnancy is visualized with FHR at 126 BPM. Biometrics give an (U/S) Gestational age of 39 1/7 weeks and an (U/S) EDD of 10/28/15; this correlates with the clinically established EDD of 10/26/15.  Fetal presentation is Vertex.  EFW: 3425g (7lb 9oz) Williams 57th percentile. Placenta: Anterior, grade 1, remote to cervix. AFI: 10.3cm.   Impression: 1. 38 1/7 week Viable Singleton Intrauterine pregnancy by U/S. 2. (U/S) EDD is consistent with Clinically established (LMP) EDD of 10/26/15. 3. Adequate growth and AFI

## 2015-10-18 ENCOUNTER — Inpatient Hospital Stay
Admission: EM | Admit: 2015-10-18 | Discharge: 2015-10-20 | DRG: 775 | Disposition: A | Discharge status: Home / Self Care | Payer: Managed Care, Other (non HMO) | Attending: Obstetrics and Gynecology | Admitting: Obstetrics and Gynecology

## 2015-10-18 ENCOUNTER — Other Ambulatory Visit (INDEPENDENT_AMBULATORY_CARE_PROVIDER_SITE_OTHER): Payer: Managed Care, Other (non HMO)

## 2015-10-18 ENCOUNTER — Observation Stay (HOSPITAL_BASED_OUTPATIENT_CLINIC_OR_DEPARTMENT_OTHER)
Admission: EM | Admit: 2015-10-18 | Discharge: 2015-10-18 | Disposition: A | Discharge status: Home / Self Care | Payer: Managed Care, Other (non HMO) | Source: Home / Self Care | Admitting: Obstetrics and Gynecology

## 2015-10-18 ENCOUNTER — Ambulatory Visit (INDEPENDENT_AMBULATORY_CARE_PROVIDER_SITE_OTHER): Payer: Managed Care, Other (non HMO) | Admitting: Obstetrics and Gynecology

## 2015-10-18 ENCOUNTER — Other Ambulatory Visit: Payer: Self-pay | Admitting: Obstetrics and Gynecology

## 2015-10-18 ENCOUNTER — Encounter: Payer: Self-pay | Admitting: *Deleted

## 2015-10-18 DIAGNOSIS — O24419 Gestational diabetes mellitus in pregnancy, unspecified control: Secondary | ICD-10-CM | POA: Diagnosis present

## 2015-10-18 DIAGNOSIS — Z3493 Encounter for supervision of normal pregnancy, unspecified, third trimester: Secondary | ICD-10-CM | POA: Diagnosis not present

## 2015-10-18 DIAGNOSIS — Z833 Family history of diabetes mellitus: Secondary | ICD-10-CM | POA: Diagnosis not present

## 2015-10-18 DIAGNOSIS — O24410 Gestational diabetes mellitus in pregnancy, diet controlled: Secondary | ICD-10-CM

## 2015-10-18 DIAGNOSIS — O4693 Antepartum hemorrhage, unspecified, third trimester: Secondary | ICD-10-CM

## 2015-10-18 DIAGNOSIS — O24420 Gestational diabetes mellitus in childbirth, diet controlled: Principal | ICD-10-CM | POA: Diagnosis present

## 2015-10-18 DIAGNOSIS — R58 Hemorrhage, not elsewhere classified: Secondary | ICD-10-CM | POA: Diagnosis present

## 2015-10-18 DIAGNOSIS — K219 Gastro-esophageal reflux disease without esophagitis: Secondary | ICD-10-CM | POA: Diagnosis present

## 2015-10-18 DIAGNOSIS — Z3A38 38 weeks gestation of pregnancy: Secondary | ICD-10-CM

## 2015-10-18 DIAGNOSIS — O9962 Diseases of the digestive system complicating childbirth: Secondary | ICD-10-CM | POA: Diagnosis present

## 2015-10-18 DIAGNOSIS — Z3483 Encounter for supervision of other normal pregnancy, third trimester: Secondary | ICD-10-CM | POA: Diagnosis present

## 2015-10-18 LAB — CBC
HCT: 37.4 % (ref 35.0–47.0)
Hemoglobin: 12.9 g/dL (ref 12.0–16.0)
MCH: 29.7 pg (ref 26.0–34.0)
MCHC: 34.6 g/dL (ref 32.0–36.0)
MCV: 85.9 fL (ref 80.0–100.0)
PLATELETS: 183 10*3/uL (ref 150–440)
RBC: 4.35 MIL/uL (ref 3.80–5.20)
RDW: 14.7 % — AB (ref 11.5–14.5)
WBC: 10 10*3/uL (ref 3.6–11.0)

## 2015-10-18 LAB — TYPE AND SCREEN
ABO/RH(D): A POS
Antibody Screen: NEGATIVE

## 2015-10-18 LAB — GLUCOSE, CAPILLARY
GLUCOSE-CAPILLARY: 77 mg/dL (ref 65–99)
Glucose-Capillary: 96 mg/dL (ref 65–99)

## 2015-10-18 MED ORDER — OXYTOCIN BOLUS FROM INFUSION: 500.0000 mL | Freq: Once | INTRAVENOUS | Status: DC

## 2015-10-18 MED ORDER — AMMONIA AROMATIC IN INHA
RESPIRATORY_TRACT | Status: AC
  Filled 2015-10-18: qty 10

## 2015-10-18 MED ORDER — OXYTOCIN 40 UNITS IN LACTATED RINGERS INFUSION - SIMPLE MED
1.0000 m[IU]/min | INTRAVENOUS | Status: DC
  Administered 2015-10-18: 2 m[IU]/min via INTRAVENOUS
  Filled 2015-10-18: qty 1000

## 2015-10-18 MED ORDER — OXYTOCIN 10 UNIT/ML IJ SOLN
INTRAMUSCULAR | Status: AC
  Filled 2015-10-18: qty 2

## 2015-10-18 MED ORDER — LACTATED RINGERS IV SOLN
INTRAVENOUS | Status: DC
  Administered 2015-10-18: 1000 mL via INTRAVENOUS
  Administered 2015-10-18: 22:00:00 via INTRAVENOUS

## 2015-10-18 MED ORDER — TERBUTALINE SULFATE 1 MG/ML IJ SOLN: 0.2500 mg | Freq: Once | INTRAMUSCULAR | Status: DC | PRN

## 2015-10-18 MED ORDER — ONDANSETRON HCL 4 MG/2ML IJ SOLN: 4.0000 mg | Freq: Four times a day (QID) | INTRAMUSCULAR | Status: DC | PRN

## 2015-10-18 MED ORDER — ACETAMINOPHEN 325 MG PO TABS: 650.0000 mg | ORAL_TABLET | ORAL | Status: DC | PRN

## 2015-10-18 MED ORDER — LIDOCAINE HCL (PF) 1 % IJ SOLN: 30.0000 mL | INTRAMUSCULAR | Status: DC | PRN

## 2015-10-18 MED ORDER — IBUPROFEN 600 MG PO TABS
ORAL_TABLET | ORAL | Status: AC
  Administered 2015-10-18: 600 mg
  Filled 2015-10-18: qty 1

## 2015-10-18 MED ORDER — SOD CITRATE-CITRIC ACID 500-334 MG/5ML PO SOLN
30.0000 mL | ORAL | Status: DC | PRN
Start: 2015-10-18 — End: 2015-10-19
  Filled 2015-10-18: qty 30

## 2015-10-18 MED ORDER — LIDOCAINE HCL (PF) 1 % IJ SOLN
INTRAMUSCULAR | Status: AC
  Filled 2015-10-18: qty 30

## 2015-10-18 MED ORDER — FENTANYL CITRATE (PF) 100 MCG/2ML IJ SOLN
50.0000 ug | INTRAMUSCULAR | Status: DC | PRN
  Administered 2015-10-18: 100 ug via INTRAVENOUS
  Filled 2015-10-18: qty 2

## 2015-10-18 MED ORDER — OXYTOCIN 40 UNITS IN LACTATED RINGERS INFUSION - SIMPLE MED: 2.5000 [IU]/h | INTRAVENOUS | Status: DC

## 2015-10-18 MED ORDER — MISOPROSTOL 200 MCG PO TABS
ORAL_TABLET | ORAL | Status: AC
  Filled 2015-10-18: qty 4

## 2015-10-18 MED ORDER — LACTATED RINGERS IV SOLN: 500.0000 mL | INTRAVENOUS | Status: DC | PRN

## 2015-10-18 NOTE — Progress Notes (Signed)
Report to Melody, CNM.  Patient to walk for 20 mins and recheck cervix.  If no change may D/C home.

## 2015-10-18 NOTE — Progress Notes (Signed)
Yesenia Davis is a 40 y.o. [id number] at 2555w6d by LMP admitted for active labor  Subjective: Reports little relief with IV fentanyl  Objective: BP 108/76 (BP Location: Right Arm)   Pulse 63   Temp 98.5 F (36.9 C) (Oral)   Resp 18   Ht 5\' 3"  (1.6 m)   Wt 156 lb (70.8 kg)   LMP 01/10/2015 (Exact Date)   BMI 27.63 kg/m  No intake/output data recorded. No intake/output data recorded.  FHT:  FHR: 133 bpm, variability: moderate,  accelerations:  Present,  decelerations:  Absent UC:   regular, every 5-6 minutes SVE:   6/90/-1 per RN exam after SROM Labs: Lab Results  Component Value Date   WBC 10.0 10/18/2015   HGB 12.9 10/18/2015   HCT 37.4 10/18/2015   MCV 85.9 10/18/2015   PLT 183 10/18/2015    Assessment / Plan: Spontaneous labor, progressing normally  Labor: Progressing normally Preeclampsia:  labs stable Fetal Wellbeing:  Category I Pain Control:  IV pain meds I/D:  n/a Anticipated MOD:  NSVD  Gali Spinney N Aura CampsShambley, CNM 10/18/2015, 9:57 PM

## 2015-10-18 NOTE — Plan of Care (Signed)
Pt sent over from office for cervical dilatation. Admission  In progress.

## 2015-10-18 NOTE — H&P (Signed)
Obstetric History and Physical  Yesenia Davis is a 40 y.o. [phone] with IUP at [redacted]w[redacted]d presenting with contractions worse than this morning and dark red vaginal bleeding with clots. Sent from office, after ultrasound confirmed no previa/LLP or abruption.. Patient states she has been having  regular, every 5 minutes contractions, moderate vaginal bleeding, intact membranes, with active fetal movement.    Prenatal Course Source [facility] Care: [facility]  Pregnancy complications or risks:GDM, AMA  Prenatal labs and studies: ABO, Rh: A/Positive/-- (03/17 0849) Antibody: Negative (03/17 0849) Rubella: 8.12 (03/17 0849) RPR: Non Reactive (03/17 0849)  HBsAg: Negative (03/17 0849)  HIV: Non Reactive (03/17 0849)  JYN:WGNFAOZH (09/20 1156) 1 hr Glucola  elevated Genetic screening normal Anatomy US normal  Past Medical History:  Diagnosis Date  . Acne vulgaris   . Epigastric pain   . Gastritis   . GERD (gastroesophageal reflux disease)   . Gestational diabetes   . Migraines     Past Surgical History:  Procedure Laterality Date  . CHOLECYSTECTOMY  1996  . cyst removed     from neck- b9  . DILATION AND CURETTAGE OF UTERUS      OB History  Gravida Para Term Preterm AB Living  4 2 2   1 2   SAB TAB Ectopic Multiple Live Births  1       2    # Outcome Date GA Lbr Len/2nd Weight Sex Delivery Anes PTL Lv  4 Current           3 Term 2005   7 lb 1.8 oz (3.225 kg) M Vag-Spont   LIV  2 SAB 2003          1 Term 1996   7 lb 3.2 oz (3.266 kg) F Vag-Spont   LIV      Social History   Social History  . Marital status: Single    Spouse name: N/A  . Number of children: N/A  . Years of education: N/A   Social History Main Topics  . Smoking status: Never Smoker  . Smokeless tobacco: Never Used  . Alcohol use No  . Drug use: No  . Sexual activity: Yes    Birth control/ protection: None   Other Topics Concern  . None   Social History Narrative  . None    Family History  Problem Relation  Age of Onset  . Breast cancer Maternal Aunt     40's  . Diabetes Father   . Diabetes Mother   . Ovarian cancer Neg Hx   . Colon cancer Neg Hx   . Heart disease Neg Hx     Prescriptions Prior to Admission  Medication Sig Dispense Refill Last Dose  . acetaminophen (TYLENOL) 500 MG chewable tablet Chew 500 mg by mouth every 6 (six) hours as needed for pain.   Not Taking at Unknown time  . glucose blood (ACCU-CHEK SMARTVIEW) test strip Use as instructed to check blood sugars 100 each 12 10/17/2015 at Unknown time  . Lancet Devices (ACCU-CHEK SOFTCLIX) lancets Use as instructed 1 each 1 10/17/2015 at Unknown time  . Prenatal Vit-Fe Fumarate-FA (PRENATAL MULTIVITAMIN) TABS tablet Take 1 tablet by mouth daily at 12 noon.   Past Week at Unknown time    Allergies  Allergen Reactions  . Ciprofloxacin Hcl Rash    Review of Systems: Negative except for what is mentioned in HPI.  Physical Exam: LMP 01/10/2015 (Exact Date)  GENERAL: Well-developed, well-nourished female in no acute distress.  LUNGS:  Clear to auscultation bilaterally.  HEART: Regular rate and rhythm. ABDOMEN: Soft, nontender, nondistended, gravid. EXTREMITIES: Nontender, no edema, 2+ distal pulses. Cervical Exam: Dilation: 4.5 Effacement (%): 100 Cervical Position: Posterior Station: -2 Presentation: Vertex Exam by:: j miller,rn FHT:  Baseline rate 132 bpm   Variability moderate  Accelerations present   Decelerations none Contractions: Every 3-6 mins   Pertinent Labs/Studies:   Results for orders placed or performed during the hospital encounter of 10/18/15 (from the past 24 hour(s))  Glucose, capillary     Status: None   Collection Time: 10/18/15  8:18 AM  Result Value Ref Range   Glucose-Capillary 77 65 - 99 mg/dL    Assessment : Yesenia Davis is a 40 y.o. [id number] at 395w6d being admitted for labor. Diet controlled GDM, vaginal bleeding.  Plan: Labor: Expectant management.  Induction/Augmentation as needed, per  protocol FWB: Reassuring fetal heart tracing.  GBS negative Delivery plan: Hopeful for vaginal delivery  Melody Shambley, CNM [facility]

## 2015-10-18 NOTE — Progress Notes (Signed)
Work in ob- came in due to regular contractions 10 minutes apart and dark red vaginal bleeding with clots. ? Placental edge felt on exam, with large clot and trickling of dark red blood noted on sterile speculum exam. Ultrasound reveals:  Indications:Placenta Location Findings:   Placenta: Anterior, grade 2, 12.2cm from cervix.  No evidence of placenta at cervix in endovaginal US..    Impression: 1. Placenta remote to cervix.   Contractions increasing, sent to L&D for delivery.

## 2015-10-18 NOTE — OB Triage Note (Signed)
Presents to L&D with brownish bleeding Tuesday.  Intercourse yesterday.  In the process of moving/packing.  Denies picking anything heavy up.  Believes she lost mucous plug.  Occasional ctx and back pain.

## 2015-10-18 NOTE — OB Triage Provider Note (Signed)
L&D OB Triage Note  Yesenia Davis is a [name] y.o. [id number] female at 5030w6d, EDD Estimated Date of Delivery: 10/26/15 who presented to triage for complaints of dark red spotting and irregular contractions.  She was evaluated by the nurses with no significant findings/findings significant for labor . Vital signs stable. An NST was performed and has been reviewed by Me. She was reassured and had no cervical change after an hour of observation and walking. Cervix remains 3cm per RN exam.  NST INTERPRETATION: Indications: rule out uterine contractions  Mode: External Baseline Rate (A): 125 bpm Variability: Moderate Accelerations: 15 x 15 Decelerations: None     Contraction Frequency (min): occasional  Impression: reactive   Plan: NST performed was reviewed and was found to be reactive. She was discharged home with bleeding/labor precautions.  Continue routine prenatal care. Follow up with OB/GYN as previously scheduled.     Onya Eutsler Suzan NailerN Jmari Pelc, CNM

## 2015-10-18 NOTE — Discharge Instructions (Signed)
Call MD with any concerns.  Fetal kick count 2 times a day.

## 2015-10-19 LAB — CBC
HCT: 35.2 % (ref 35.0–47.0)
Hemoglobin: 12 g/dL (ref 12.0–16.0)
MCH: 29.2 pg (ref 26.0–34.0)
MCHC: 34 g/dL (ref 32.0–36.0)
MCV: 85.9 fL (ref 80.0–100.0)
PLATELETS: 177 10*3/uL (ref 150–440)
RBC: 4.1 MIL/uL (ref 3.80–5.20)
RDW: 14.3 % (ref 11.5–14.5)
WBC: 17.1 10*3/uL — AB (ref 3.6–11.0)

## 2015-10-19 LAB — RPR: RPR: NONREACTIVE

## 2015-10-19 MED ORDER — ACETAMINOPHEN 325 MG PO TABS
650.0000 mg | ORAL_TABLET | ORAL | Status: DC | PRN
  Administered 2015-10-19 – 2015-10-20 (×6): 650 mg via ORAL
  Filled 2015-10-19 (×6): qty 2

## 2015-10-19 MED ORDER — SIMETHICONE 80 MG PO CHEW: 80.0000 mg | CHEWABLE_TABLET | ORAL | Status: DC | PRN

## 2015-10-19 MED ORDER — SENNOSIDES-DOCUSATE SODIUM 8.6-50 MG PO TABS
2.0000 | ORAL_TABLET | ORAL | Status: DC
  Administered 2015-10-19 (×2): 2 via ORAL
  Filled 2015-10-19 (×2): qty 2

## 2015-10-19 MED ORDER — ZOLPIDEM TARTRATE 5 MG PO TABS: 5.0000 mg | ORAL_TABLET | Freq: Every evening | ORAL | Status: DC | PRN

## 2015-10-19 MED ORDER — PRENATAL MULTIVITAMIN CH
1.0000 | ORAL_TABLET | Freq: Every day | ORAL | Status: DC
  Administered 2015-10-19 – 2015-10-20 (×2): 1 via ORAL
  Filled 2015-10-19 (×2): qty 1

## 2015-10-19 MED ORDER — OXYCODONE HCL 5 MG PO TABS
5.0000 mg | ORAL_TABLET | ORAL | Status: DC | PRN
  Administered 2015-10-20 (×3): 5 mg via ORAL
  Filled 2015-10-19 (×3): qty 1

## 2015-10-19 MED ORDER — COCONUT OIL OIL: 1.0000 "application " | TOPICAL_OIL | Status: DC | PRN

## 2015-10-19 MED ORDER — DIBUCAINE 1 % RE OINT
1.0000 | TOPICAL_OINTMENT | RECTAL | Status: DC | PRN
Start: 2015-10-19 — End: 2015-10-20

## 2015-10-19 MED ORDER — BENZOCAINE-MENTHOL 20-0.5 % EX AERO: 1.0000 "application " | INHALATION_SPRAY | CUTANEOUS | Status: DC | PRN

## 2015-10-19 MED ORDER — ONDANSETRON HCL 4 MG/2ML IJ SOLN
4.0000 mg | INTRAMUSCULAR | Status: DC | PRN
Start: 2015-10-19 — End: 2015-10-20

## 2015-10-19 MED ORDER — WITCH HAZEL-GLYCERIN EX PADS: 1.0000 "application " | MEDICATED_PAD | CUTANEOUS | Status: DC | PRN

## 2015-10-19 MED ORDER — DOCUSATE SODIUM 100 MG PO CAPS
100.0000 mg | ORAL_CAPSULE | Freq: Two times a day (BID) | ORAL | Status: DC
  Administered 2015-10-19 – 2015-10-20 (×4): 100 mg via ORAL
  Filled 2015-10-19 (×4): qty 1

## 2015-10-19 MED ORDER — ONDANSETRON HCL 4 MG PO TABS: 4.0000 mg | ORAL_TABLET | ORAL | Status: DC | PRN

## 2015-10-19 MED ORDER — DIPHENHYDRAMINE HCL 25 MG PO CAPS: 25.0000 mg | ORAL_CAPSULE | Freq: Four times a day (QID) | ORAL | Status: DC | PRN

## 2015-10-19 MED ORDER — IBUPROFEN 600 MG PO TABS
600.0000 mg | ORAL_TABLET | Freq: Four times a day (QID) | ORAL | Status: DC
  Administered 2015-10-19: 600 mg via ORAL
  Filled 2015-10-19: qty 1

## 2015-10-19 NOTE — Progress Notes (Signed)
Post Partum Day 1 Subjective: no complaints, up ad lib and voiding  Objective: Blood pressure 94/61, pulse 63, temperature 98 F (36.7 C), temperature source Oral, resp. rate 18, height 5\' 3"  (1.6 m), weight 156 lb (70.8 kg), last menstrual period 01/10/2015, SpO2 98 %, unknown if currently breastfeeding.  Physical Exam:  General: alert, cooperative and appears stated age Lochia: appropriate Uterine Fundus: firm Incision: NA DVT Evaluation: No evidence of DVT seen on physical exam. Negative Homan's sign.   Recent Labs  10/18/15 1654 10/19/15 0435  HGB 12.9 12.0  HCT 37.4 35.2    Assessment/Plan: Plan for discharge tomorrow and Breastfeeding, decided against PPTL and circumcision Infant feeding only Breast; LOS: 1 day   Melody N Shambley 10/19/2015, 12:19 PM

## 2015-10-19 NOTE — Lactation Note (Signed)
This note was copied from a baby's chart. Lactation Consultation Note  Patient Name: Yesenia Irven ShellingSofia Friske Today's Date: 10/19/2015 Reason for consult: Initial assessment   Maternal Data Formula Feeding for Exclusion: No Does the patient have breastfeeding experience prior to this delivery?: Yes  Pt's youngest child 39 yrs old, encouraged to breastfeed first , supplement with formula after only if needed, states she has no milk, pointed out to pt that she can express milk from breast, hears swallows at breast and feels uterine cramping with nursing, this shows baby is getting milk from breast. Feeding Feeding Type: Breast Fed Length of feed: 30 min  LATCH Score/Interventions Latch: Grasps breast easily, tongue down, lips flanged, rhythmical sucking.  Audible Swallowing: A few with stimulation  Type of Nipple: Everted at rest and after stimulation  Comfort (Breast/Nipple): Soft / non-tender     Hold (Positioning): Assistance needed to correctly position infant at breast and maintain latch.  LATCH Score: 8  Lactation Tools Discussed/Used WIC Program: Yes   Consult Status Consult Status: PRN    Dyann KiefMarsha D Risha Barretta 10/19/2015, 7:46 PM

## 2015-10-20 MED ORDER — VITAMIN D3 125 MCG (5000 UT) PO CAPS: 1.0000 | ORAL_CAPSULE | Freq: Every day | ORAL | 2 refills | Status: DC

## 2015-10-20 NOTE — Progress Notes (Signed)
Pt discharged home with infant.  Discharge instructions and follow up appointment given to and reviewed with pt.  Pt verbalized understanding.  Escorted by auxillary. 

## 2015-10-20 NOTE — Discharge Summary (Signed)
Obstetric Discharge Summary Reason for Admission: onset of labor Prenatal Procedures: ultrasound Intrapartum Procedures: spontaneous vaginal delivery Postpartum Procedures: none Complications-Operative and Postpartum: none Hemoglobin  Date Value Ref Range Status  10/19/2015 12.0 12.0 - 16.0 g/dL Final   HGB  Date Value Ref Range Status  09/26/2013 13.7 12.0 - 16.0 g/dL Final   HCT  Date Value Ref Range Status  10/19/2015 35.2 35.0 - 47.0 % Final   Hematocrit  Date Value Ref Range Status  03/29/2015 41.6 34.0 - 46.6 % Final    Physical Exam:  General: alert, cooperative and appears stated age Lochia: appropriate Uterine Fundus: firm Incision: nA DVT Evaluation: No evidence of DVT seen on physical exam. Negative Homan's sign.  Discharge Diagnoses: Term Pregnancy-delivered  Discharge Information: Date: 10/20/2015 Activity: pelvic rest Diet: routine Medications: PNV, Ibuprofen, Colace and Vit D3 Condition: stable Instructions: refer to practice specific booklet Discharge to: home   Newborn Data: Live born female  Birth Weight: 6 lb 14.1 oz (3120 g) APGAR: 8, 9  Home with mother.  Kaleeya Hancock NIKE Maysoon Lozada, CNM 10/20/2015, 12:54 PM

## 2015-10-24 ENCOUNTER — Telehealth: Payer: Self-pay | Admitting: Obstetrics and Gynecology

## 2015-10-24 NOTE — Telephone Encounter (Signed)
Pt is having some stomach pain, states she has been doing way more than she should, advised pt to take it easy and let me know if she is still feeling bad, pt voiced understanding

## 2015-10-24 NOTE — Telephone Encounter (Signed)
PT CALLED AND LEFT MESSAGE ON OFFICE VM STATING SHE WOULD LIKE FOR YOU TO CALL HER.

## 2015-11-18 ENCOUNTER — Telehealth: Payer: Self-pay | Admitting: Obstetrics and Gynecology

## 2015-11-18 NOTE — Telephone Encounter (Signed)
Pt is calling about her FMLA. She is wanting more time off. Please call pt

## 2015-11-19 NOTE — Telephone Encounter (Signed)
Called pt she is having post partum depression appt made 11/21/15

## 2015-11-21 ENCOUNTER — Ambulatory Visit (INDEPENDENT_AMBULATORY_CARE_PROVIDER_SITE_OTHER): Payer: Managed Care, Other (non HMO) | Admitting: Obstetrics and Gynecology

## 2015-11-21 ENCOUNTER — Encounter: Payer: Self-pay | Admitting: Obstetrics and Gynecology

## 2015-11-21 VITALS — BP 124/69 | HR 66 | Wt 144.6 lb

## 2015-11-21 DIAGNOSIS — F53 Postpartum depression: Secondary | ICD-10-CM

## 2015-11-21 DIAGNOSIS — O99345 Other mental disorders complicating the puerperium: Principal | ICD-10-CM

## 2015-11-21 MED ORDER — ESCITALOPRAM OXALATE 10 MG PO TABS: 10.0000 mg | ORAL_TABLET | Freq: Every day | ORAL | 6 refills | Status: DC

## 2015-11-21 NOTE — Progress Notes (Signed)
Subjective:     Patient ID: Yesenia Davis, female   DOB: [birth year], 40 y.o.   MRN: [id number]  HPI  Reports worsening depression over last two weeks, is currently 4 weeks postpartum. Struggled with depression during pregnancy and had a time 3 years ago (she took an antidepressant for 1 week and stopped).  See PPD scale on other note. Feels like it is related not having a permanent home (house they were buying failed inspection) and lack of sleep with newborn. Is tearful daily, and doesn't feel like she can enjoy her baby. Spouse is supportive.  Review of Systems Negative except stated above in HPI    Objective:   Physical Exam A&O x4 Well groomed female, tearful during interview Blood pressure 124/69, pulse 66, weight 144 lb 9.6 oz (65.6 kg), currently breastfeeding. Score 20 on PPD scale    Assessment:     Postpartum depression    Plan:     Started on Lexapro 10mg  daily. RTC in 2 weeks for recheck on medication and PPV, sooner if needed. Note to extend maternity leave to 12 weeks.  Melody MartinezShambley, CNM

## 2015-11-21 NOTE — Progress Notes (Signed)
   11/21/15 1120  In the past 7 days:  I have been able to laugh and see the funny side of things 2  I have looked forward with enjoyment to things 3  I have blamed myself unnecessarily when things went wrong 2  I have been anxious or worried for no good reason 3  I have felt scared or panicky for no good reason 3  I haven't been able to cope lately 1  I have been so unhappy that I have had difficulty sleeping 2  I have felt sad or miserable 3  I have been so unhappy that I have been crying 1  The thought of harming myself has occurred to me 0  Total 20   see additional notes from todays visit.  Yesenia Davis, CNM

## 2015-11-29 ENCOUNTER — Ambulatory Visit (INDEPENDENT_AMBULATORY_CARE_PROVIDER_SITE_OTHER): Payer: Managed Care, Other (non HMO) | Admitting: Obstetrics and Gynecology

## 2015-11-29 ENCOUNTER — Encounter: Payer: Self-pay | Admitting: Obstetrics and Gynecology

## 2015-11-29 DIAGNOSIS — F53 Postpartum depression: Secondary | ICD-10-CM | POA: Insufficient documentation

## 2015-11-29 DIAGNOSIS — O99345 Other mental disorders complicating the puerperium: Secondary | ICD-10-CM

## 2015-11-29 NOTE — Progress Notes (Signed)
  Subjective:     Yesenia Davis is a 40 y.o. female who presents for a postpartum visit. She is 6 weeks postpartum following a spontaneous vaginal delivery. I have fully reviewed the prenatal and intrapartum course. The delivery was at 40 gestational weeks. Outcome: spontaneous vaginal delivery. Anesthesia: none. Postpartum course has been complicated by depression. Baby's course has been uncomplicated. Baby is feeding by breast. Bleeding no bleeding. Bowel function is normal. Bladder function is normal. Patient is not sexually active. Contraception method is coitus interruptus. Postpartum depression screening: positive.  The following portions of the patient's history were reviewed and updated as appropriate: allergies, current medications, past family history, past medical history, past social history, past surgical history and problem list.  Review of Systems Pertinent items noted in HPI and remainder of comprehensive ROS otherwise negative.   Objective:    BP (!) 113/59   Pulse 69   Wt 145 lb 8 oz (66 kg)   Breastfeeding? Yes   BMI 25.77 kg/m   General:  alert, cooperative and appears stated age   Breasts:  inspection negative, no nipple discharge or bleeding, no masses or nodularity palpable  Lungs: clear to auscultation bilaterally  Heart:  regular rate and rhythm, S1, S2 normal, no murmur, click, rub or gallop  Abdomen: soft, non-tender; bowel sounds normal; no masses,  no organomegaly   Vulva:  not evaluated  Vagina: normal vagina  Cervix:  multiparous appearance  Corpus: normal size, contour, position, consistency, mobility, non-tender  Adnexa:  normal adnexa and no mass, fullness, tenderness  Rectal Exam: Not performed.        Assessment:     6 weeks postpartum exam. Pap smear not done at today's visit.   PPD Breastfeeding without complication  Plan:    1. Contraception: coitus interruptus 2. Labs obtained, to start SSRI today 3. Follow up in: 3 months for AE or as  needed.

## 2015-11-29 NOTE — Patient Instructions (Addendum)
Place postpartum visit patient instructions here.  Depresin posparto y baby blues (Postpartum Depression and Baby Blues) El perodo del posparto comienza inmediatamente despus del nacimiento de un beb y suele ser una poca de gran felicidad y mucho entusiasmo. Tambin es tiempo de muchos cambios en la vida de Garfieldlos padres. Sin importar cuntos hijos tenga una madre, cada nio plantea nuevos desafos y Neomia Dearuna nueva dinmica a la familia. Es frecuente que haya sentimientos de entusiasmo junto con cambios confusos en el estado de nimo, las emociones y los pensamientos. Todas las madres corren riesgo de tener depresin posparto o "baby blues". Estos cambios en el estado de nimo pueden presentarse inmediatamente despus del parto o muchos meses despus del nacimiento de un nio. El baby blues o la depresin posparto pueden ser leves o graves. Adems, la depresin posparto puede desaparecer con bastante rapidez o puede ser una enfermedad de larga evolucin. CAUSAS Se cree que el aumento de las concentraciones hormonales y su rpida disminucin es la causa principal de la depresin posparto y el baby blues. Durante y despus del Corneliusembarazo, un nmero de hormonas [place]cambian. El estrgeno y la progesterona generalmente disminuyen inmediatamente despus del parto. Tambin disminuyen con Praxairrapidez las concentraciones de la hormona tiroidea y de varios esteroides del cortisol. Otros factores que juegan un papel en estos cambios anmicos incluyen los sucesos importantes de la vida y Designer, industrial/productla gentica. FACTORES DE RIESGO Si tiene alguno de los siguientes riesgos de desarrollar baby blues o depresin posparto, sepa a qu sntomas debe estar atenta durante el perodo del posparto. Los factores de riesgo que pueden aumentar la probabilidad de Warehouse managertener baby blues o depresin posparto incluyen lo siguiente:  Antecedentes personales o familiares de depresin.  Depresin Academic librariandurante el embarazo.  Problemas anmicos premenstruales o que  guardan relacin con los anticonceptivos orales.  Mucho estrs en la vida.  Conflictos maritales.  Falta de una red de apoyo social.  Wilburt Finlayener un beb con necesidades especiales.  Problemas de salud, como diabetes. SIGNOS Y SNTOMAS Los sntomas de baby blues incluyen lo siguiente:  Cambios en el estado de nimo durante lapsos breves, como pasar de la felicidad extrema a la tristeza.  Falta de concentracin.  Dificultad para dormir.  Ataques de llanto, sensibilidad emocional.  Irritabilidad.  Ansiedad. Generalmente, los sntomas de depresin posparto comienzan en el trmino del primer mes despus del parto. Estos sntomas incluyen:  Dificultad para dormir o somnolencia excesiva.  Prdida de peso notable.  Agitacin.  Sentimientos de inutilidad.  Falta de inters en las actividades o la comida. La psicosis posparto es una enfermedad muy grave que puede ser peligrosa. Afortunadamente, es poco frecuente. Si aparece alguno de los siguientes sntomas, se debe recibir atencin mdica de inmediato. Los sntomas de psicosis posparto incluyen lo siguiente:  Alucinaciones y delirios.  Comportamiento atpico o desorganizado.  Confusin o desorientacin. DIAGNSTICO El diagnstico se realiza mediante la evaluacin de los sntomas. No hay exmenes mdicos ni pruebas de laboratorio que permitan hacer un diagnstico, pero hay varios cuestionarios que el mdico puede usar para identificar a las personas que tienen baby blues, depresin posparto o psicosis. A menudo se [place]a una herramienta de deteccin sistemtica llamada Escala de depresin posnatal de [place] para diagnosticar la depresin durante el perodo del posparto. TRATAMIENTO Generalmente, el baby blues desaparece solo en el trmino de 1 o 2semanas. A menudo, todo lo que se necesita es apoyo social. Se le recomendar que duerma y descanse lo suficiente. En ocasiones, se le pueden administrar medicamentos para ayudarla  a  dormir. La depresin posparto requiere tratamiento porque puede durar varios meses o ms tiempo si no se la trata. El tratamiento puede incluir terapia individual o grupal, medicamentos o ambos para abordar los factores Dearbornsociales, fisiolgicos y psicolgicos que pueden tener influencia en la depresin. Tambin pueden recomendarse enfticamente el ejercicio regular, una alimentacin sana, el descanso y el apoyo social. La psicosis posparto es una enfermedad ms grave y requiere tratamiento inmediato. A menudo es necesaria la hospitalizacin. INSTRUCCIONES PARA EL CUIDADO EN EL HOGAR  Descanse todo lo posible. Tome una siesta cuando el beb duerme.  Haga ejercicios regularmente. Para algunas mujeres, el yoga y las caminatas son beneficiosas.  Consuma una dieta equilibrada y nutritiva.  Haga pequeas cosas que disfruta. Tome una taza de t, dese un bao de burbujas, lea su revista favorita o escuche su msica predilecta.  Evite el alcohol.  Pida ayuda con los H&R Blockquehaceres domsticos, la cocina, las compras de comida o las obligaciones diarias si lo necesita. No intente hacer todo.  Hable con personas allegadas sobre cmo se siente. Busque el apoyo de su pareja, sus familiares, sus amigos o de Rockwell Automationotras madres primerizas.  Intente pensar positivamente. Piense en aquellas cosas por las que se siente agradecida.  No pase mucho tiempo sola.  Tome solo medicamentos de venta libre o recetados, segn las indicaciones del mdico.  Cumpla con todos los controles del postparto.  Infrmele a su mdico sobre cualquier inquietud que tenga. SOLICITE ATENCIN MDICA SI: Shelle Ironiene una reaccin al medicamento o problemas con este. SOLICITE ATENCIN MDICA DE INMEDIATO SI:  Tiene sentimientos suicidas.  Cree que podra lastimar al beb o a Engineer, maintenance (IT)otra persona. ASEGRESE DE QUE:  Comprende estas instrucciones.  Controlar su afeccin.  Recibir ayuda de inmediato si no mejora o si empeora. Esta informacin no  tiene Theme park managercomo fin reemplazar el consejo del mdico. Asegrese de hacerle al mdico cualquier pregunta que tenga. Document Released: 06/17/2007 Document Revised: 01/03/2013 Document Reviewed: 10/10/2012 Elsevier Interactive Patient Education  2017 [employer]vier Inc.  Ejercicios de Kegel (Kegel Exercises) Los ejercicios de Kegel ayudan a fortalecer los msculos que sostienen el recto, la vagina, el intestino delgado, la vejiga y Sirenel tero. Los ejercicios de Kegel pueden ayudar a lo siguiente:  Mejorar el control de la vejiga y de los intestinos.  Mejorar la respuesta sexual.  Reducir los problemas o las molestias durante el Severnembarazo. Los ejercicios de Kegel implican apretar los msculos del suelo plvico, que son los mismos msculos que comprime cuando trata de TEFL teacherdetener el flujo de la Los Olivosorina. Los ejercicios se pueden Ecologistrealizar mientras est sentado, parado o Nortonacostado, pero lo mejor es variar la posicin. EJERCICIOS DE KEGEL 1. Apriete bien los msculos del suelo plvico. Debera sentir una elevacin firme en el rea del recto. Si es [place], tambin debera sentir una compresin en el rea de la vagina. Mantenga el Loganvilleestmago, las nalgas y las piernas [address]relajadas. 2. Mantenga los msculos apretados durante 10segundos como mximo. 3. Relaje los msculos. Repita este ejercicio 50veces al da o como se lo haya indicado el mdico. Contine haciendo este ejercicio durante al menos 4 a 6semanas y Therapist, sportsdurante el tiempo que le haya indicado el mdico. Esta informacin no tiene Theme park managercomo fin reemplazar el consejo del mdico. Asegrese de hacerle al mdico cualquier pregunta que tenga. Document Released: 12/16/2011 Document Revised: 11/18/2014 Document Reviewed: 11/18/2014 Elsevier Interactive Patient Education  2017 [employer]vier Inc.

## 2015-11-30 LAB — CBC
HEMATOCRIT: 39.6 % (ref 34.0–46.6)
HEMOGLOBIN: 13.1 g/dL (ref 11.1–15.9)
MCH: 28.2 pg (ref 26.6–33.0)
MCHC: 33.1 g/dL (ref 31.5–35.7)
MCV: 85 fL (ref 79–97)
Platelets: 348 10*3/uL (ref 150–379)
RBC: 4.65 x10E6/uL (ref 3.77–5.28)
RDW: 14.3 % (ref 12.3–15.4)
WBC: 7.8 10*3/uL (ref 3.4–10.8)

## 2015-11-30 LAB — IRON: Iron: 101 ug/dL (ref 27–159)

## 2015-11-30 LAB — VITAMIN D 25 HYDROXY (VIT D DEFICIENCY, FRACTURES): VIT D 25 HYDROXY: 51.4 ng/mL (ref 30.0–100.0)

## 2016-02-04 ENCOUNTER — Telehealth: Payer: Self-pay | Admitting: Obstetrics and Gynecology

## 2016-02-04 NOTE — Telephone Encounter (Signed)
PT CALLED AND SHE IS HAVING A LOT OF PAIN IN HER BACK AND THEN IT RADIATES ALL THE WAY UP TO HER NECK. THEN WHEN I TOLD HER THAT I WOULD SEND A MESSAGE TO YOU BECAUSE I WASN'T SURE IF WE WOULD SEE HER FOR BACK PAIN SHE THEN STATED THAT SHE WAS HAVING PAIN IN PELVIC AREA. I THOUGH MAYBE YOU COULD CALL HER AND FIGURE OUT WHAT PAIN SHE IS HAVING.

## 2016-02-05 ENCOUNTER — Ambulatory Visit (INDEPENDENT_AMBULATORY_CARE_PROVIDER_SITE_OTHER): Payer: Managed Care, Other (non HMO) | Admitting: Obstetrics and Gynecology

## 2016-02-05 ENCOUNTER — Encounter: Payer: Self-pay | Admitting: Obstetrics and Gynecology

## 2016-02-05 VITALS — BP 107/76 | HR 66 | Ht 63.0 in | Wt 144.5 lb

## 2016-02-05 DIAGNOSIS — M545 Low back pain, unspecified: Secondary | ICD-10-CM

## 2016-02-05 DIAGNOSIS — R102 Pelvic and perineal pain: Secondary | ICD-10-CM

## 2016-02-05 LAB — POCT URINALYSIS DIPSTICK
Bilirubin, UA: NEGATIVE
Glucose, UA: NEGATIVE
Ketones, UA: NEGATIVE
LEUKOCYTES UA: NEGATIVE
NITRITE UA: NEGATIVE
PROTEIN UA: NEGATIVE
Spec Grav, UA: 1.015
UROBILINOGEN UA: 0.2
pH, UA: 6

## 2016-02-05 NOTE — Progress Notes (Signed)
Subjective:     Patient ID: Yesenia Davis, female   DOB: [birth year], 41 y.o.   MRN: [id number]  HPI Reports low back pain that started 10-14 days ago, on left side and radiates around to front lower abdomen, now on right side also. Had similar pain in pregnancy and pelvis was out of line. Tried exercises she did in the past with no relief. Denies any urinary or bowel changes. No alleviating factor. Worse with certain twisting motions.  Review of Systems Negative except stated above in HPI    Objective:   Physical Exam A&O x4 Well groomed female Today's Vitals   02/05/16 1348  BP: 107/76  Pulse: 66  Weight: 144 lb 8 oz (65.5 kg)  Height: 5\' 3"  (1.6 m)  spine straight without bulging or tender disc, lower muscle slightly tender Abdomen non-tender and soft Pelvic exam: normal external genitalia, vulva, vagina, cervix, uterus and adnexa.    Assessment:     Low back pain, muscle in origin    Plan:     Recommend evaluation by Dr Alfredo Bachecil (chiropractor) and possible TENS unit. Motrin or aleeve as needed. RTC as needed.  Yesenia Davis, CNM

## 2016-02-05 NOTE — Addendum Note (Signed)
Addended by: Rosine BeatLONTZ, Deshana Rominger L on: 02/05/2016 03:03 PM   Modules accepted: Orders

## 2016-02-05 NOTE — Telephone Encounter (Signed)
Pt is coming in 02/05/16

## 2016-02-07 LAB — URINE CULTURE

## 2016-02-27 ENCOUNTER — Encounter: Payer: Managed Care, Other (non HMO) | Admitting: Obstetrics and Gynecology

## 2016-05-21 ENCOUNTER — Ambulatory Visit (INDEPENDENT_AMBULATORY_CARE_PROVIDER_SITE_OTHER): Payer: Managed Care, Other (non HMO) | Admitting: Obstetrics and Gynecology

## 2016-05-21 ENCOUNTER — Other Ambulatory Visit: Payer: Self-pay | Admitting: Obstetrics and Gynecology

## 2016-05-21 ENCOUNTER — Encounter: Payer: Self-pay | Admitting: Obstetrics and Gynecology

## 2016-05-21 VITALS — BP 117/67 | HR 67 | Ht 63.0 in | Wt 144.0 lb

## 2016-05-21 DIAGNOSIS — Z01419 Encounter for gynecological examination (general) (routine) without abnormal findings: Secondary | ICD-10-CM | POA: Diagnosis not present

## 2016-05-21 NOTE — Patient Instructions (Signed)
Cuidados preventivos en las mujeres de entre 18 y 39aos (Preventive Care 18-39 Years, Female) Los cuidados preventivos hacen referencia a las opciones en cuanto al estilo de vida y a las visitas al mdico, las cuales pueden promover la salud y el bienestar. QU INCLUYEN LOS CUIDADOS PREVENTIVOS?  Un examen fsico anual. Tambin se lo conoce como control de rutina anual.  Exmenes dentales una o dos veces al ao.  Exmenes oculares de rutina. Pregntele al mdico con qu frecuencia debe hacerse controles oculares.  Opciones personales en cuanto al estilo de vida, entre ellas: ? Cuidar diariamente las encas y las piezas dentales. ? Realizar actividad fsica con regularidad. ? Consumir una dieta saludable. ? Evitar el consumo de tabaco y de drogas. ? Limitar el consumo de bebidas alcohlicas. ? Practicar el sexo seguro. ? Tomar los suplementos vitamnicos y minerales como se lo haya indicado el mdico.  QU SUCEDE DURANTE UN CONTROL DE RUTINA ANUAL? Los servicios y las pruebas de deteccin que el mdico realiza durante un control de rutina anual dependern de la edad, el estado de salud general, los factores de riesgo relacionados con el estilo de vida y los antecedentes familiares de enfermedades. Psicoterapia El mdico puede hacerle preguntas sobre lo siguiente:  Consumo de alcohol.  Consumo de tabaco.  Consumo de drogas.  Bienestar emocional.  Bienestar familiar y en las relaciones.  Actividad sexual.  Hbitos de alimentacin.  Trabajo y entorno laboral.  Mtodos anticonceptivos.  El ciclo menstrual.  Antecedentes de embarazos. Pruebas de deteccin Pueden hacerle las siguientes pruebas o mediciones:  Estatura, peso e ndice de masa corporal (IMC).  Pruebas de deteccin de la diabetes. Para realizarlas, se controla el nivel de azcar en la sangre (glucemia) despus de que haya pasado un rato sin comer (ayuno).  La presin arterial.  Niveles de lpidos y  colesterol. Se pueden controlar cada 5aos, a partir de los 20aos.  Controles de la piel.  Anlisis de sangre de hepatitisC.  Anlisis de sangre de hepatitisB.  Pruebas de deteccin de enfermedades de transmisin sexual (ETS).  Pruebas de deteccin de tumores malignos relacionados con el BRCA. Se pueden realizar si tiene antecedentes familiares de cncer de mama, de ovarios, de trompas o de peritoneo.  Examen plvico y prueba de Papanicolaou. Se pueden realizar cada 3aos, a partir de los 21aos. A partir de los 30aos, se pueden realizar cada 5aos si le hacen una prueba de Papanicolaou en combinacin con un anlisis del virus del papiloma humano (VPH). Hable con el mdico sobre los resultados de las pruebas, las opciones de tratamiento y, si es necesario, la necesidad de realizar ms estudios. Vacunas El mdico puede recomendarle que se aplique algunas vacunas, por ejemplo:  Vacuna antigripal. Se recomienda su aplicacin todos los aos.  Vacuna contra la difteria, el ttanos y la tosferina acelular (Tdap, Td). Puede que tenga que aplicarse un refuerzo contra el ttanos y la difteria (Td) cada 10aos.  Vacuna contra la varicela. Es posible que tenga que aplicrsela si no recibi esta vacuna.  Vacuna contra el VPH. Si es menor de 26aos, puede que necesite aplicarse tres dosis a lo largo de 6meses.  Vacuna contra el sarampin, la rubola y las paperas (SRP). Tendr que aplicarse por lo menos una dosis de la SRP. Podra tambin necesitar una segunda dosis.  Vacuna antineumoccica conjugada 13valente (PCV13). Puede necesitar esta vacuna si tiene determinadas enfermedades y no se vacun anteriormente.  Vacuna antineumoccica de polisacridos (PPSV23). Quizs tenga que aplicarse una o dos   dosis si fuma o si sufre determinadas enfermedades.  Vacuna antimeningoccica. Se recomienda la aplicacin de una dosis si tiene entre 19 y 21aos, y es estudiante universitaria de primer  ao que vive en una residencia estudiantil, o si tiene una de varias enfermedades. Podra tambin necesitar dosis de refuerzo.  Vacuna contra la hepatitis A. Es posible que tenga que aplicarse esta vacuna si tiene determinadas enfermedades, o si trabaja en lugares donde puede estar expuesta a la hepatitisA o viaja a estas zonas.  Vacuna contra la hepatitis B. Es posible que tenga que aplicarse esta vacuna si tiene determinadas enfermedades, o si trabaja en lugares donde puede estar expuesta a la hepatitisB o viaja a estas zonas.  Vacuna antihaemophilus influenzae tipoB (Hib). Tal vez deba aplicarse esta vacuna si tiene determinados factores de riesgo. Hable con el mdico sobre las pruebas de deteccin y las vacunas que tiene que aplicarse, y con qu frecuencia debe hacerlo. Esta informacin no tiene como fin reemplazar el consejo del mdico. Asegrese de hacerle al mdico cualquier pregunta que tenga. Document Released: 10/08/2004 Document Revised: 01/19/2014 Document Reviewed: 10/30/2014 Elsevier Interactive Patient Education  2017 Elsevier Inc.  

## 2016-05-21 NOTE — Progress Notes (Signed)
Subjective:   Yesenia ShellingSofia Davis is a 41 y.o. [phone] Hispanic female here for a routine well-woman exam.  Patient's last menstrual period was 04/20/2016.    Current complaints: abdominal bloating and lax muscles PCP: none       does desire labs  Social History: Sexual: heterosexual Marital Status: married Living situation: with family Occupation: homemaker Tobacco/alcohol: no tobacco use Illicit drugs: no history of illicit drug use  The following portions of the patient's history were reviewed and updated as appropriate: allergies, current medications, past family history, past medical history, past social history, past surgical history and problem list.  Past Medical History Past Medical History:  Diagnosis Date  . Acne vulgaris   . Epigastric pain   . Gastritis   . GERD (gastroesophageal reflux disease)   . Gestational diabetes   . Migraines     Past Surgical History Past Surgical History:  Procedure Laterality Date  . CHOLECYSTECTOMY  1996  . cyst removed     from neck- b9  . DILATION AND CURETTAGE OF UTERUS      Gynecologic History [id number]  Patient's last menstrual period was 04/20/2016. Contraception: condoms Last Pap: ?Marland Kitchen. Results were: normal   Obstetric History OB History  Gravida Para Term Preterm AB Living  4 3 3   1 3   SAB TAB Ectopic Multiple Live Births  1     0 3    # Outcome Date GA Lbr Len/2nd Weight Sex Delivery Anes PTL Lv  4 Term 10/18/15 [id number] / 00:06 6 lb 14.1 oz (3.12 kg) M Vag-Spont None  LIV  3 Term 2005   7 lb 1.8 oz (3.225 kg) M Vag-Spont   LIV  2 SAB 2003          1 Term 1996   7 lb 3.2 oz (3.266 kg) F Vag-Spont   LIV      Current Medications Current Outpatient Prescriptions on File Prior to Visit  Medication Sig Dispense Refill  . acetaminophen (TYLENOL) 500 MG chewable tablet Chew 500 mg by mouth every 6 (six) hours as needed for pain.     No current facility-administered medications on file prior to visit.     Review of  Systems Patient denies any headaches, blurred vision, shortness of breath, chest pain, abdominal pain, problems with bowel movements, urination, or intercourse.  Objective:  BP 117/67   Pulse 67   Ht 5\' 3"  (1.6 m)   Wt 144 lb (65.3 kg)   LMP 04/20/2016   Breastfeeding? No   BMI 25.51 kg/m  Physical Exam  General:  Well developed, well nourished, no acute distress. She is alert and oriented x3. Skin:  Warm and dry Neck:  Midline trachea, no thyromegaly or nodules Cardiovascular: Regular rate and rhythm, no murmur heard Lungs:  Effort normal, all lung fields clear to auscultation bilaterally Breasts:  No dominant palpable mass, retraction, or nipple discharge Abdomen:  Soft, non tender, no hepatosplenomegaly or masses Pelvic:  External genitalia is normal in appearance.  The vagina is normal in appearance. The cervix is bulbous, no CMT.  Thin prep pap is done with HR HPV cotesting. Uterus is felt to be normal size, shape, and contour.  No adnexal masses or tenderness noted. Extremities:  No swelling or varicosities noted Psych:  She has a normal mood and affect  Assessment:   Healthy well-woman exam   Plan:  Labs obtained will follow up accordingly Recommended abdominal binder F/U 1 year for AE, or sooner if needed  Gaby Harney Rockney Ghee, CNM

## 2016-05-22 LAB — COMPREHENSIVE METABOLIC PANEL
ALK PHOS: 92 IU/L (ref 39–117)
ALT: 26 IU/L (ref 0–32)
AST: 22 IU/L (ref 0–40)
Albumin/Globulin Ratio: 1.4 (ref 1.2–2.2)
Albumin: 4.5 g/dL (ref 3.5–5.5)
BUN/Creatinine Ratio: 24 — ABNORMAL HIGH (ref 9–23)
BUN: 16 mg/dL (ref 6–20)
Bilirubin Total: 0.3 mg/dL (ref 0.0–1.2)
CALCIUM: 9.7 mg/dL (ref 8.7–10.2)
CO2: 23 mmol/L (ref 18–29)
Chloride: 97 mmol/L (ref 96–106)
Creatinine, Ser: 0.68 mg/dL (ref 0.57–1.00)
GFR calc Af Amer: 127 mL/min/{1.73_m2} (ref >59)
GFR, EST NON AFRICAN AMERICAN: 111 mL/min/{1.73_m2} (ref >59)
GLOBULIN, TOTAL: 3.2 g/dL (ref 1.5–4.5)
GLUCOSE: 88 mg/dL (ref 65–99)
POTASSIUM: 4.2 mmol/L (ref 3.5–5.2)
SODIUM: 140 mmol/L (ref 134–144)
Total Protein: 7.7 g/dL (ref 6.0–8.5)

## 2016-05-22 LAB — HEMOGLOBIN A1C
Est. average glucose Bld gHb Est-mCnc: 117 mg/dL
Hgb A1c MFr Bld: 5.7 % — ABNORMAL HIGH (ref 4.8–5.6)

## 2016-05-22 LAB — LIPID PANEL
Chol/HDL Ratio: 3 ratio (ref 0.0–4.4)
Cholesterol, Total: 142 mg/dL (ref 100–199)
HDL: 47 mg/dL (ref >39)
LDL Calculated: 71 mg/dL (ref 0–99)
Triglycerides: 121 mg/dL (ref 0–149)
VLDL Cholesterol Cal: 24 mg/dL (ref 5–40)

## 2016-05-26 LAB — CYTOLOGY - PAP

## 2016-05-28 ENCOUNTER — Telehealth: Payer: Self-pay | Admitting: *Deleted

## 2016-05-28 NOTE — Telephone Encounter (Signed)
-----   Message from Purcell NailsMelody N Shambley, Pennsylvania[place]ode IslandCNM sent at 05/27/2016  5:09 PM EDT ----- Please let her know results. A1C does show increased risk for developing diabetes, so she needs to continue low carb/sugar diet.

## 2016-05-28 NOTE — Telephone Encounter (Signed)
-----   Message from Melody N Shambley, CNM sent at 05/27/2016  5:09 PM EDT ----- Please let her know results. A1C does show increased risk for developing diabetes, so she needs to continue low carb/sugar diet. 

## 2016-05-28 NOTE — Telephone Encounter (Signed)
Mailed all info to pt 

## 2016-09-09 ENCOUNTER — Other Ambulatory Visit: Payer: Self-pay | Admitting: Internal Medicine

## 2016-09-09 DIAGNOSIS — Z1231 Encounter for screening mammogram for malignant neoplasm of breast: Secondary | ICD-10-CM

## 2016-09-17 ENCOUNTER — Encounter: Payer: Self-pay | Admitting: Radiology

## 2016-09-17 ENCOUNTER — Ambulatory Visit
Admission: RE | Admit: 2016-09-17 | Discharge: 2016-09-17 | Disposition: A | Discharge status: Home / Self Care | Payer: Managed Care, Other (non HMO) | Source: Ambulatory Visit | Attending: Internal Medicine | Admitting: Internal Medicine

## 2016-09-17 DIAGNOSIS — Z1231 Encounter for screening mammogram for malignant neoplasm of breast: Secondary | ICD-10-CM | POA: Insufficient documentation

## 2016-12-08 IMAGING — US US OB TRANSVAGINAL
1 series · 14 of 28 positions shown · non-contrast
Comparison: None.

CLINICAL DATA: Abdominal pain and cramping.

EXAM:
OBSTETRIC <14 WK US AND TRANSVAGINAL OB US
TECHNIQUE: Both transabdominal and transvaginal ultrasound examinations were
performed for complete evaluation of the gestation as well as the
maternal uterus, adnexal regions, and pelvic cul-de-sac.
Transvaginal technique was performed to assess early pregnancy.

[Series 1: us ob transvaginal · 0.13mm/px · 14 of 98 slices shown]
[im 4/98]
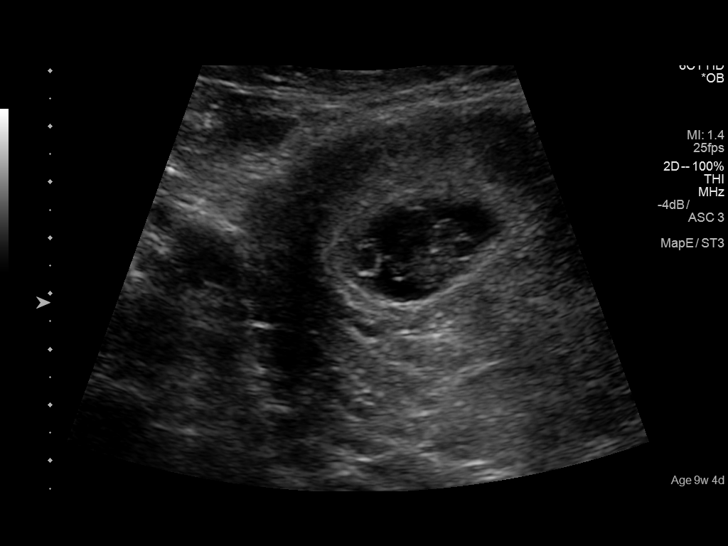
[im 11/98]
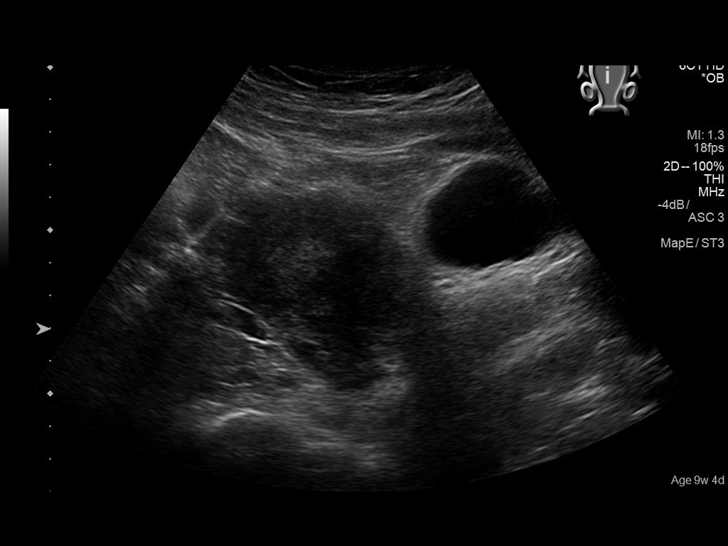
[im 18/98]
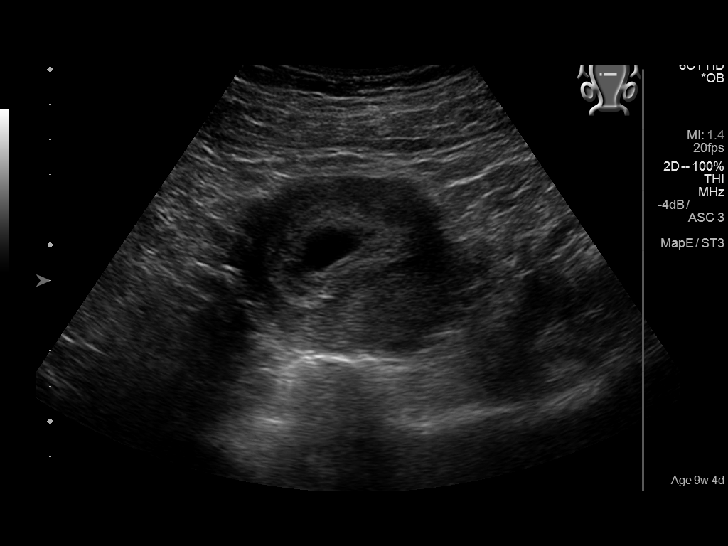
[im 26/98]
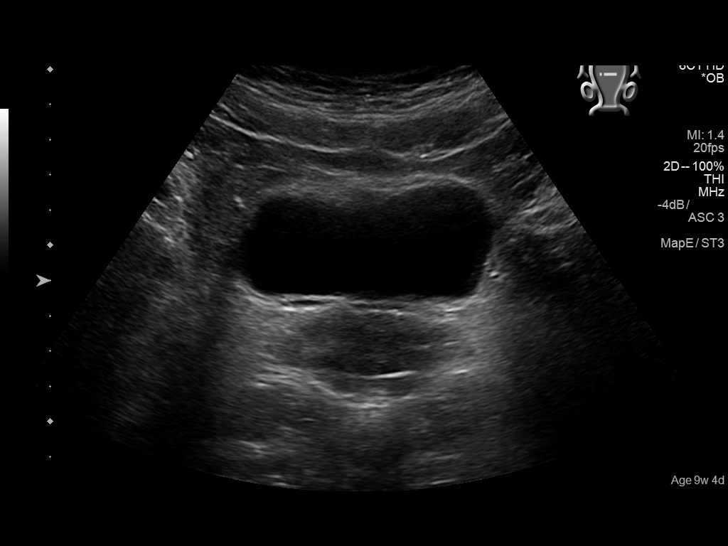
[im 33/98]
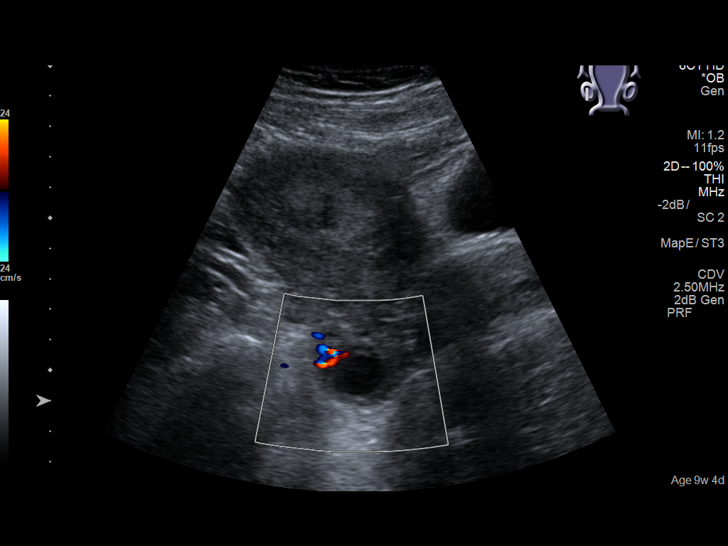
[im 40/98]
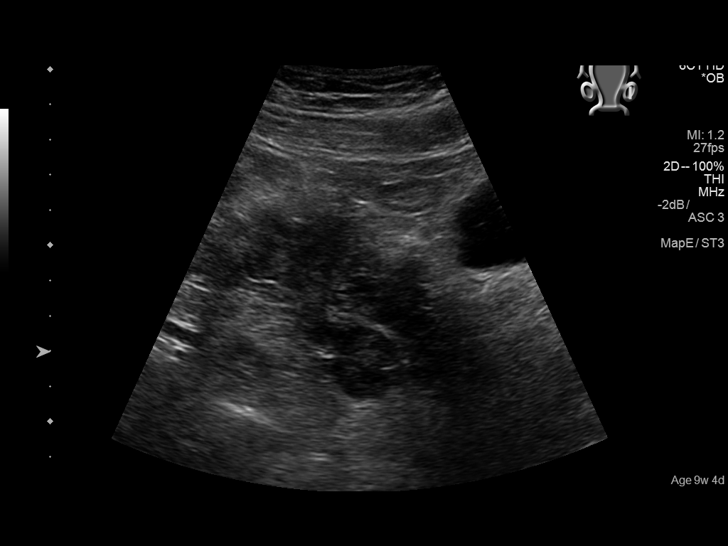
[im 47/98]
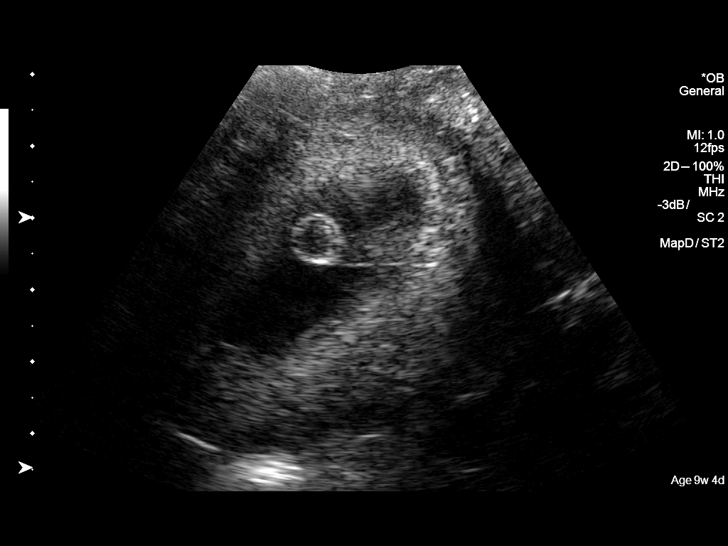
[im 54/98]
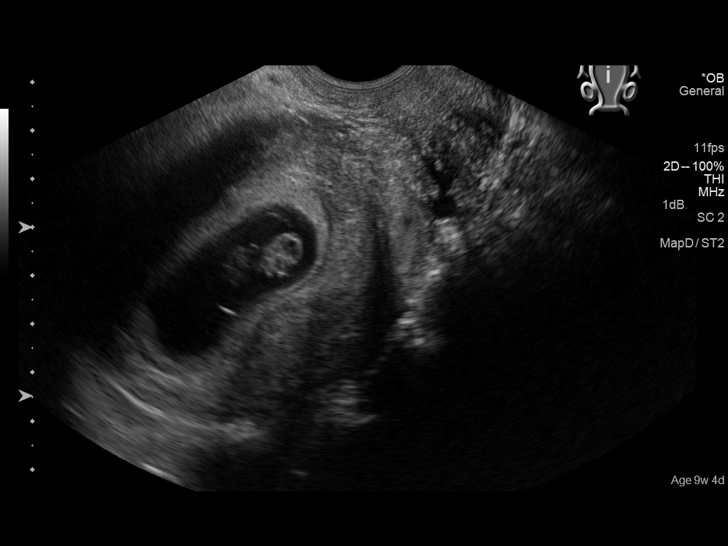
[im 62/98]
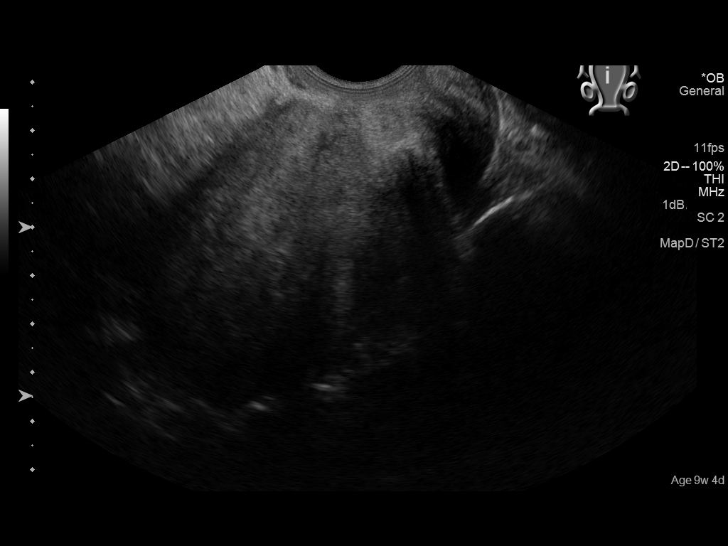
[im 69/98]
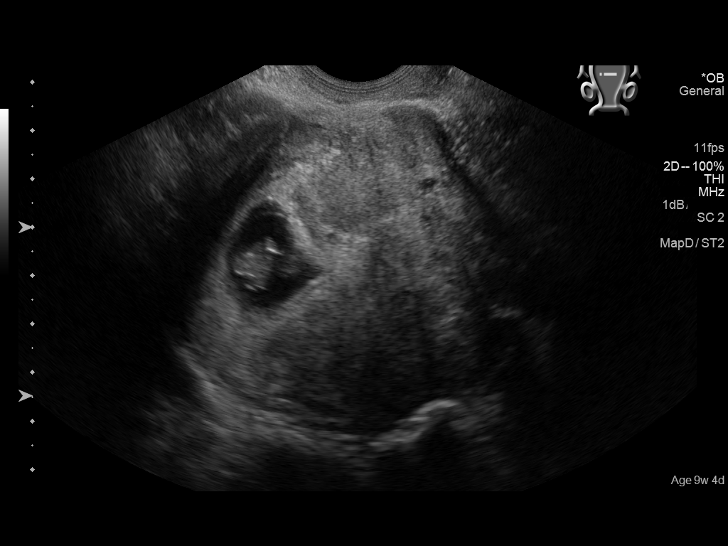
[im 76/98]
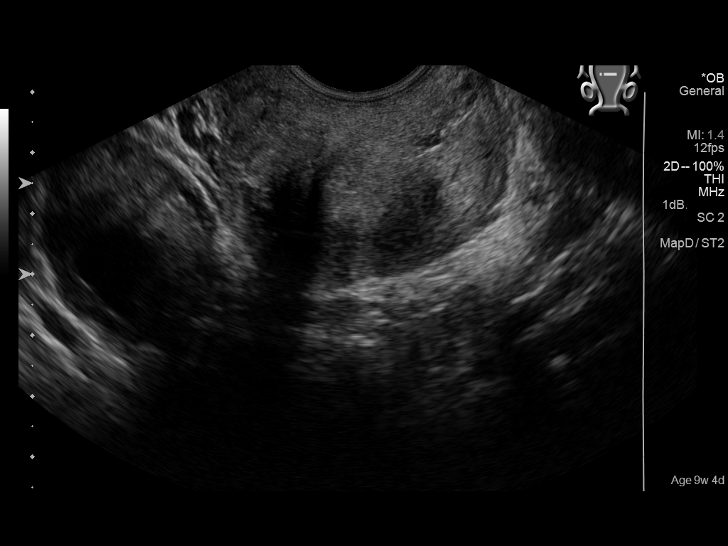
[im 83/98]
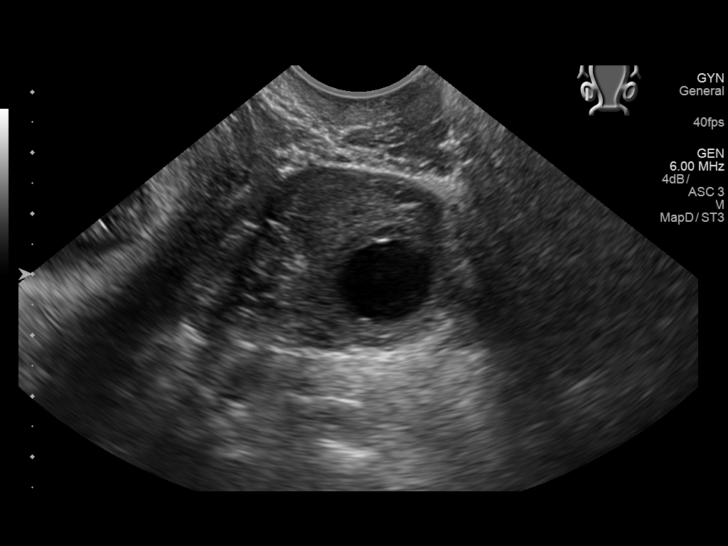
[im 90/98]
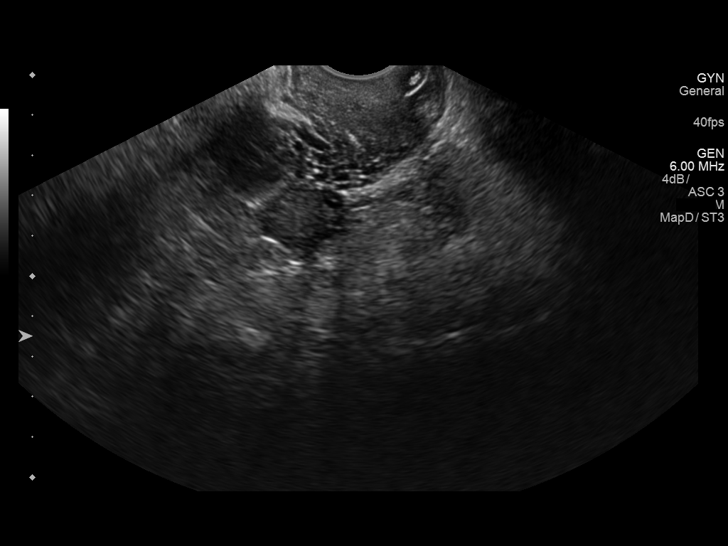
[im 98/98]
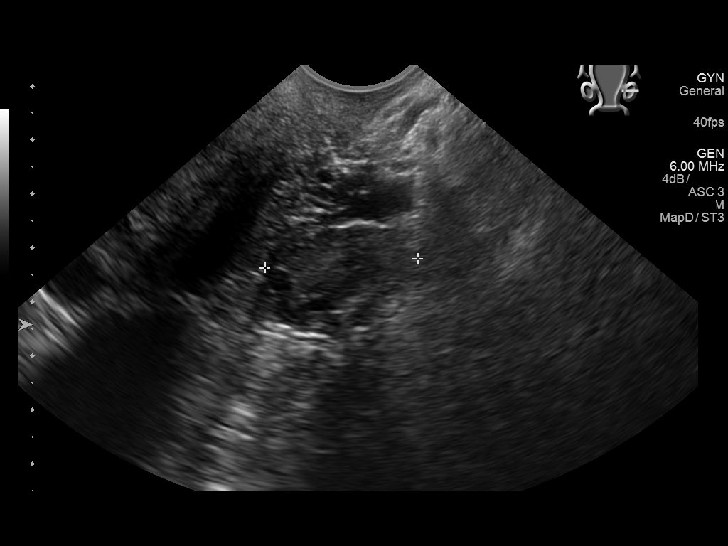

[14 of 28 positions shown; findings below may reference images not displayed]

FINDINGS: Intrauterine gestational sac: Visualized/normal in shape.

Yolk sac:  Visualized.

Embryo:  Visualized.

Cardiac Activity: Visualized.

Heart Rate: 165  bpm

CRL: 17.7 mm 8 w 2 d US EDC: October 26, 2015.

Subchorionic hemorrhage:  None visualized.

Maternal uterus/adnexae: Probable corpus luteum cyst seen in right
ovary. Left ovary appears normal. No free fluid is noted.
IMPRESSION: Single live intrauterine gestation of 8 weeks 2 days.

## 2017-03-03 ENCOUNTER — Telehealth: Payer: Self-pay | Admitting: Obstetrics & Gynecology

## 2017-03-03 NOTE — Telephone Encounter (Signed)
Drinda Buttsharles Drews referring Therapist, sportscontreceptive mangagement. Unable to reach patient to due to vob not set up

## 2017-03-15 ENCOUNTER — Encounter: Payer: Self-pay | Admitting: Obstetrics and Gynecology

## 2017-03-15 ENCOUNTER — Ambulatory Visit (INDEPENDENT_AMBULATORY_CARE_PROVIDER_SITE_OTHER): Payer: 59 | Admitting: Obstetrics and Gynecology

## 2017-03-15 VITALS — BP 124/78 | Ht 63.0 in | Wt 142.0 lb

## 2017-03-15 DIAGNOSIS — Z308 Encounter for other contraceptive management: Secondary | ICD-10-CM
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
## 2017-03-17 ENCOUNTER — Encounter: Payer: Self-pay | Admitting: Obstetrics and Gynecology

## 2017-12-22 ENCOUNTER — Other Ambulatory Visit: Payer: Self-pay

## 2017-12-22 ENCOUNTER — Emergency Department: Payer: Self-pay

## 2017-12-22 ENCOUNTER — Emergency Department
Admission: EM | Admit: 2017-12-22 | Discharge: 2017-12-23 | Disposition: A | Discharge status: Home / Self Care | Payer: Self-pay | Attending: Emergency Medicine | Admitting: Emergency Medicine

## 2017-12-22 DIAGNOSIS — R079 Chest pain, unspecified: Secondary | ICD-10-CM

## 2017-12-22 DIAGNOSIS — Z79899 Other long term (current) drug therapy: Secondary | ICD-10-CM | POA: Insufficient documentation

## 2017-12-22 DIAGNOSIS — R0789 Other chest pain: Secondary | ICD-10-CM | POA: Insufficient documentation

## 2017-12-22 LAB — CBC
HEMATOCRIT: 42.3 % (ref 36.0–46.0)
HEMOGLOBIN: 13.4 g/dL (ref 12.0–15.0)
MCH: 28.9 pg (ref 26.0–34.0)
MCHC: 31.7 g/dL (ref 30.0–36.0)
MCV: 91.2 fL (ref 80.0–100.0)
NRBC: 0 % (ref 0.0–0.2)
Platelets: 313 10*3/uL (ref 150–400)
RBC: 4.64 MIL/uL (ref 3.87–5.11)
RDW: 12.7 % (ref 11.5–15.5)
WBC: 12.3 10*3/uL — ABNORMAL HIGH (ref 4.0–10.5)

## 2017-12-22 LAB — COMPREHENSIVE METABOLIC PANEL
ALT: 30 U/L (ref 0–44)
ANION GAP: 8 (ref 5–15)
AST: 26 U/L (ref 15–41)
Albumin: 4.2 g/dL (ref 3.5–5.0)
Alkaline Phosphatase: 70 U/L (ref 38–126)
BILIRUBIN TOTAL: 0.9 mg/dL (ref 0.3–1.2)
BUN: 15 mg/dL (ref 6–20)
CHLORIDE: 102 mmol/L (ref 98–111)
CO2: 26 mmol/L (ref 22–32)
CREATININE: 1 mg/dL (ref 0.44–1.00)
Calcium: 8.9 mg/dL (ref 8.9–10.3)
GFR calc non Af Amer: >60 mL/min (ref >60)
GLUCOSE: 102 mg/dL — AB (ref 70–99)
POTASSIUM: 3.6 mmol/L (ref 3.5–5.1)
Sodium: 136 mmol/L (ref 135–145)
TOTAL PROTEIN: 7.6 g/dL (ref 6.5–8.1)

## 2017-12-22 LAB — TROPONIN I

## 2017-12-22 NOTE — ED Provider Notes (Signed)
[facility] Emergency Department Provider Note   First MD Initiated Contact with Patient 12/22/17 2301     (approximate)  I have reviewed the triage vital signs and the nursing notes.   HISTORY  Chief Complaint Chest Pain    HPI Yesenia Davis is a 42 y.o. female with below list of chronic medical conditions presents to the emergency department with acute onset of central chest tightness with radiation to the back and left arm which patient states has been occurring all day today.  Patient denies any dyspnea.  Patient denies any lower extremity pain or swelling.  Patient also admits to suprapubic discomfort with dysuria for which patient states that she saw her primary care provider yesterday and was diagnosed with a urinary tract infection and prescribed antibiotics.   Past Medical History:  Diagnosis Date  . Acne vulgaris   . Epigastric pain   . Gastritis   . GERD (gastroesophageal reflux disease)   . Gestational diabetes   . Migraines     Patient Active Problem List   Diagnosis Date Noted  . Postpartum depression 11/29/2015  . Gestational diabetes mellitus 10/18/2015    Past Surgical History:  Procedure Laterality Date  . CHOLECYSTECTOMY  1996  . cyst removed     from neck- b9  . DILATION AND CURETTAGE OF UTERUS      Prior to Admission medications   Medication Sig Start Date End Date Taking? Authorizing Provider  acetaminophen (TYLENOL) 500 MG chewable tablet Chew 500 mg by mouth every 6 (six) hours as needed for pain.    [provider]  medroxyPROGESTERone (DEPO-PROVERA) 150 MG/ML injection  02/24/17   [provider]    Allergies Ciprofloxacin hcl  Family History  Problem Relation Age of Onset  . Breast cancer Maternal Aunt        40's  . Diabetes Father   . Diabetes Mother   . Ovarian cancer Neg Hx   . Colon cancer Neg Hx   . Heart disease Neg Hx     Social History Social History   Tobacco Use  .  Smoking status: Never Smoker  . Smokeless tobacco: Never Used  Substance Use Topics  . Alcohol use: No  . Drug use: No    Review of Systems Constitutional: No fever/chills Eyes: No visual changes. ENT: No sore throat. Cardiovascular: Positive for chest tightness.Marland Kitchen Respiratory: Denies shortness of breath. Gastrointestinal: No abdominal pain.  No nausea, no vomiting.  No diarrhea.  No constipation. Genitourinary: Negative for dysuria. Musculoskeletal: Negative for neck pain.  Negative for back pain. Integumentary: Negative for rash. Neurological: Negative for headaches, focal weakness or numbness.  ____________________________________________   PHYSICAL EXAM:  VITAL SIGNS: ED Triage Vitals  Enc Vitals Group     BP 12/22/17 2131 (!) 132/91     Pulse Rate 12/22/17 2131 73     Resp 12/22/17 2131 20     Temp 12/22/17 2131 97.9 F (36.6 C)     Temp Source 12/22/17 2131 Oral     SpO2 12/22/17 2131 100 %     Weight 12/22/17 2132 64.4 kg (142 lb)     Height --      Head Circumference --      Peak Flow --      Pain Score 12/22/17 2132 8     Pain Loc --      Pain Edu? --      Excl. in GC? --     Constitutional: Alert  and oriented. Well appearing and in no acute distress. Eyes: Conjunctivae are normal.  Head: Atraumatic. Mouth/Throat: Mucous membranes are moist.  Oropharynx non-erythematous. Neck: No stridor.  Cardiovascular: Normal rate, regular rhythm. Good peripheral circulation. Grossly normal heart sounds. Respiratory: Normal respiratory effort.  No retractions. Lungs CTAB. Gastrointestinal: Soft and nontender. No distention.  Musculoskeletal: No lower extremity tenderness nor edema. No gross deformities of extremities. Neurologic:  Normal speech and language. No gross focal neurologic deficits are appreciated.  Skin:  Skin is warm, dry and intact. No rash noted. Psychiatric: Mood and affect are normal. Speech and behavior are  normal.  ____________________________________________   LABS (all labs ordered are listed, but only abnormal results are displayed)  Labs Reviewed  CBC - Abnormal; Notable for the following components:      Result Value   WBC 12.3 (*)    All other components within normal limits  COMPREHENSIVE METABOLIC PANEL - Abnormal; Notable for the following components:   Glucose, Bld 102 (*)    All other components within normal limits  URINALYSIS, COMPLETE (UACMP) WITH MICROSCOPIC - Abnormal; Notable for the following components:   Color, Urine YELLOW (*)    APPearance CLEAR (*)    Bacteria, UA RARE (*)    All other components within normal limits  TROPONIN I  FIBRIN DERIVATIVES D-DIMER ([facility] ONLY)  TROPONIN I   ____________________________________________  EKG  ED ECG REPORT I, [place] N Cassidie Veiga, the attending physician, personally viewed and interpreted this ECG.   Date: 12/22/2017  EKG Time: 9:35 PM  Rate: 70  Rhythm: Normal sinus rhythm  Axis: Normal  Intervals: Normal  ST&T Change: None  ____________________________________________  RADIOLOGY I, [place] N Raphaella Larkin, personally viewed and evaluated these images (plain radiographs) as part of my medical decision making, as well as reviewing the written report by the radiologist.  ED MD interpretation: No active cardiopulmonary disease noted on chest x-ray per radiologist.  Official radiology report(s): Dg Chest 2 View  Result Date: 12/22/2017 CLINICAL DATA:  Chest pain EXAM: CHEST - 2 VIEW COMPARISON:  None. FINDINGS: The heart size and mediastinal contours are within normal limits. Both lungs are clear. The visualized skeletal structures are unremarkable. IMPRESSION: No active cardiopulmonary disease. Electronically Signed   By: Jasmine PangKim  Fujinaga M.D.   On: 12/22/2017 23:56     Procedures   ____________________________________________   INITIAL IMPRESSION / ASSESSMENT AND PLAN / ED COURSE  As part of my medical  decision making, I reviewed the following data within the electronic MEDICAL RECORD NUMBER  42 year old female presented with above-stated history and physical exam secondary to chest tightness.  Considered possibility of CAD however EKG revealed no evidence of ischemia or infarction and troponin negative x2.  Also considered possibility of pulmonary emboli in this low risk patient and as such d-dimer was performed which was negative.  Patient will be referred to cardiology for further outpatient evaluation for chest pain. ____________________________________________  FINAL CLINICAL IMPRESSION(S) / ED DIAGNOSES  Final diagnoses:  Chest pain, unspecified type     MEDICATIONS GIVEN DURING THIS VISIT:  Medications - No data to display   ED Discharge Orders    None       Note:  This document was prepared using Dragon voice recognition software and may include unintentional dictation errors.    Darci CurrentBrown, Hooper N, MD 12/23/17 [phone]

## 2017-12-22 NOTE — ED Triage Notes (Signed)
Pt in with co chest pain that started yesterday with some dizziness. Pt states she does have hx of vertigo but no chest pain in the past. No recent illness or cold symptoms.

## 2017-12-23 LAB — URINALYSIS, COMPLETE (UACMP) WITH MICROSCOPIC
Bilirubin Urine: NEGATIVE
Glucose, UA: NEGATIVE mg/dL
HGB URINE DIPSTICK: NEGATIVE
KETONES UR: NEGATIVE mg/dL
LEUKOCYTES UA: NEGATIVE
NITRITE: NEGATIVE
Protein, ur: NEGATIVE mg/dL
SPECIFIC GRAVITY, URINE: 1.02 (ref 1.005–1.030)
pH: 6 (ref 5.0–8.0)

## 2017-12-23 LAB — TROPONIN I

## 2017-12-23 LAB — FIBRIN DERIVATIVES D-DIMER (ARMC ONLY): FIBRIN DERIVATIVES D-DIMER (ARMC): 183.92 ng{FEU}/mL (ref 0.00–499.00)

## 2018-01-11 ENCOUNTER — Other Ambulatory Visit: Payer: Self-pay | Admitting: Internal Medicine

## 2018-01-11 DIAGNOSIS — Z1231 Encounter for screening mammogram for malignant neoplasm of breast: Secondary | ICD-10-CM

## 2018-02-02 ENCOUNTER — Ambulatory Visit
Admission: RE | Admit: 2018-02-02 | Discharge: 2018-02-02 | Disposition: A | Discharge status: Home / Self Care | Payer: BLUE CROSS/BLUE SHIELD | Source: Ambulatory Visit | Attending: Internal Medicine | Admitting: Internal Medicine

## 2018-02-02 DIAGNOSIS — Z1231 Encounter for screening mammogram for malignant neoplasm of breast: Secondary | ICD-10-CM | POA: Diagnosis not present

## 2018-02-09 ENCOUNTER — Ambulatory Visit (INDEPENDENT_AMBULATORY_CARE_PROVIDER_SITE_OTHER): Payer: BLUE CROSS/BLUE SHIELD | Admitting: Obstetrics and Gynecology

## 2018-02-09 ENCOUNTER — Encounter: Payer: Self-pay | Admitting: Obstetrics and Gynecology

## 2018-02-09 ENCOUNTER — Other Ambulatory Visit (HOSPITAL_COMMUNITY)
Admission: RE | Admit: 2018-02-09 | Discharge: 2018-02-09 | Disposition: A | Discharge status: Home / Self Care | Payer: BLUE CROSS/BLUE SHIELD | Source: Ambulatory Visit | Attending: Obstetrics and Gynecology | Admitting: Obstetrics and Gynecology

## 2018-02-09 VITALS — BP 107/74 | HR 84 | Ht 63.0 in | Wt 139.2 lb

## 2018-02-09 DIAGNOSIS — Z01419 Encounter for gynecological examination (general) (routine) without abnormal findings: Secondary | ICD-10-CM | POA: Insufficient documentation

## 2018-02-09 NOTE — Progress Notes (Signed)
Subjective:   Yesenia Davis is a 43 y.o. [phone] Hispanic female here for a routine well-woman exam.  Patient's last menstrual period was 01/28/2018.    Current complaints: seeing a cardiologist in [facility] for chest pain. Last menses was heavy for one day, then normal. Is desiring another pregnancy and desires discussing risk factors. PCP: [facility]       doesn't desire labs  Social History: Sexual: heterosexual Marital Status: married Living situation: with family Occupation: homemaker Tobacco/alcohol: no tobacco use Illicit drugs: no history of illicit drug use  The following portions of the patient's history were reviewed and updated as appropriate: allergies, current medications, past family history, past medical history, past social history, past surgical history and problem list.  Past Medical History Past Medical History:  Diagnosis Date  . Acne vulgaris   . Epigastric pain   . Gastritis   . GERD (gastroesophageal reflux disease)   . Gestational diabetes   . Migraines     Past Surgical History Past Surgical History:  Procedure Laterality Date  . CHOLECYSTECTOMY  1996  . cyst removed     from neck- b9  . DILATION AND CURETTAGE OF UTERUS      Gynecologic History [id number]  Patient's last menstrual period was 01/28/2018. Contraception: Depo-Provera injections Last Pap: 2018. Results were: normal Last mammogram: 01/2018. Results were: normal   Obstetric History OB History  Gravida Para Term Preterm AB Living  4 3 3   1 3   SAB TAB Ectopic Multiple Live Births  1     0 3    # Outcome Date GA Lbr Len/2nd Weight Sex Delivery Anes PTL Lv  4 Term 10/18/15 [redacted]w[redacted]d / 00:06 6 lb 14.1 oz (3.12 kg) M Vag-Spont None  LIV  3 Term 2005   7 lb 1.8 oz (3.225 kg) M Vag-Spont   LIV  2 SAB 2003          1 Term 1996   7 lb 3.2 oz (3.266 kg) F Vag-Spont   LIV    Current Medications Current Outpatient Medications on File Prior to Visit  Medication Sig Dispense Refill  . acetaminophen  (TYLENOL) 500 MG chewable tablet Chew 500 mg by mouth every 6 (six) hours as needed for pain.    . medroxyPROGESTERone (DEPO-PROVERA) 150 MG/ML injection      No current facility-administered medications on file prior to visit.     Review of Systems Patient denies any headaches, blurred vision, shortness of breath, chest pain, abdominal pain, problems with bowel movements, urination, or intercourse.  Objective:  BP 107/74   Pulse 84   Ht 5\' 3"  (1.6 m)   Wt 139 lb 3.2 oz (63.1 kg)   LMP 01/28/2018   BMI 24.66 kg/m  Physical Exam  General:  Well developed, well nourished, no acute distress. She is alert and oriented x3. Skin:  Warm and dry Neck:  Midline trachea, no thyromegaly or nodules Cardiovascular: Regular rate and rhythm, no murmur heard Lungs:  Effort normal, all lung fields clear to auscultation bilaterally Breasts:  No dominant palpable mass, retraction, or nipple discharge Abdomen:  Soft, non tender, no hepatosplenomegaly or masses Pelvic:  External genitalia is normal in appearance.  The vagina is normal in appearance. The cervix is bulbous, no CMT.  Thin prep pap is  done . Uterus is felt to be normal size, shape, and contour.  No adnexal masses or tenderness noted. Extremities:  No swelling or varicosities noted Psych:  She has a normal mood  and affect  Assessment:   Healthy well-woman exam Pre-conception counseling   Plan:  Pap obtained  Counseled regarding increased chances of having GDM in future pregnancies and slight increased risks for down's syndrome and birth defects based on advance maternal age. States a clear understanding. Is still strongly considering a pregnancy in near future.   F/U 1 year for AE, or sooner if needed  Yesenia Davis, CNM

## 2018-02-09 NOTE — Patient Instructions (Signed)
Place annual gynecologic exam patient instructions here.

## 2018-02-14 LAB — CYTOLOGY - PAP
Diagnosis: NEGATIVE
HPV (WINDOPATH): NOT DETECTED

## 2018-04-19 ENCOUNTER — Telehealth: Payer: Self-pay | Admitting: Obstetrics and Gynecology

## 2018-04-19 NOTE — Telephone Encounter (Signed)
The patient called and stated that she needs the prescription sample she was receiving sent into her pharmacy. The patient mentioned the medication was a prenatal vitamin. Please advise.

## 2018-04-26 ENCOUNTER — Other Ambulatory Visit: Payer: Self-pay

## 2018-04-26 MED ORDER — CONCEPT OB 130-92.4-1 MG PO CAPS: 1.0000 | ORAL_CAPSULE | Freq: Every day | ORAL | 11 refills | Status: DC

## 2018-04-26 NOTE — Telephone Encounter (Signed)
Pt informed script sent to [facility].

## 2018-04-26 NOTE — Telephone Encounter (Signed)
Call transferred from front desk-  Pt left message 4/7 with no responses. Pt is trying to get pregnant. Request PNV. Concept erx.

## 2018-04-28 ENCOUNTER — Other Ambulatory Visit: Payer: Self-pay

## 2018-04-28 ENCOUNTER — Telehealth: Payer: Self-pay | Admitting: Obstetrics and Gynecology

## 2018-04-28 MED ORDER — PRENATAL VITAMINS 28-0.8 MG PO TABS: 1.0000 | ORAL_TABLET | Freq: Every day | ORAL | 6 refills | Status: DC

## 2018-04-28 NOTE — Telephone Encounter (Signed)
Patient called stating the concept vitamins are too expensive and requests something cheaper to be sent in.Thanks

## 2018-07-22 ENCOUNTER — Ambulatory Visit: Payer: BLUE CROSS/BLUE SHIELD | Admitting: Obstetrics and Gynecology

## 2018-07-22 ENCOUNTER — Other Ambulatory Visit: Payer: Self-pay

## 2018-07-22 ENCOUNTER — Encounter: Payer: Self-pay | Admitting: Obstetrics and Gynecology

## 2018-07-22 VITALS — BP 109/68 | HR 66 | Ht 63.0 in | Wt 145.1 lb

## 2018-07-22 DIAGNOSIS — N920 Excessive and frequent menstruation with regular cycle: Secondary | ICD-10-CM

## 2018-07-22 MED ORDER — CITRANATAL BLOOM 90-1 MG PO TABS: 1.0000 | ORAL_TABLET | Freq: Every day | ORAL | 11 refills | Status: DC

## 2018-07-22 MED ORDER — CLOMIPHENE CITRATE 50 MG PO TABS: 50.0000 mg | ORAL_TABLET | Freq: Every day | ORAL | 3 refills | Status: DC

## 2018-07-22 NOTE — Patient Instructions (Signed)
Clomiphene tablets Qu es este medicamento? El CLOMIFENO es un medicamento para la fertilidad que se [place] ([place]) Islandsutiliza para aumentar la posibilidad de Burundiquedar embarazada. Se Bots[place]ausa para estimular Hotel manageruna ovulacin (producir un huevo maduro) adecuada durante el ciclo de la [place]. Este medicamento puede ser utilizado para otros usos; si tiene alguna pregunta consulte con su proveedor de atencin mdica o con su farmacutico. MARCAS COMUNES: Clomid, Serophene Qu le debo informar a mi profesional de la salud antes de tomar este medicamento? Necesita saber si usted presenta alguno de los siguientes problemas o situaciones:  enfermedad de la glndula suprarrenal  enfermedad vascular, trastorno de coagulacin sangunea  quiste en los ovarios  endometriosis  enfermedad heptica  carcinoma de ovario  enfermedad de la glndula pituitaria  sangrado vaginal que no ha sido evaluado  una reaccin alrgica o inusual al clomifeno, a otros medicamentos, alimentos, colorantes o conservantes  si est embarazada (no debe usar si est embarazada)  si est amamantando a un beb Cmo debo utilizar este medicamento? Tome este medicamento por va oral con un vaso de agua. Siga las instrucciones de la etiqueta del [place] Jamesmedicamento. Tomar exactamente segn se indica y [address] el nmero exacto de [address]  [address] para los que fue recetado. Tome sus dosis a intervalos regulares. La Harley-Davidsonmayora de las mujeres toman este medicamento durante un perodo de [address] Bishopdas, Biomedical engineerpero la duracin del tratamiento puede ajustarse en algunos casos. Su mdico le Field seismologistindicarn el da en que debe empezar a tomar este medicamento y Engineer, sitele darn las indicaciones para Animatorel seguimiento. No tome su medicamento con una frecuencia mayor que la indicada. Hable con su pediatra para informarse acerca del uso de este medicamento en nios. Puede requerir atencin especial. Sobredosis: Pngase en contacto inmediatamente con un centro toxicolgico o una sala de urgencia si usted cree que haya  tomado demasiado medicamento. ATENCIN: Reynolds AmericanEste medicamento es solo para usted. No comparta este medicamento con nadie. Qu sucede si me olvido de una dosis? Si olvida una dosis, tmela lo antes posible. Si es casi la hora de la prxima dosis, tome slo esa dosis. No tome dosis adicionales o dobles. Qu puede interactuar con este medicamento?  suplementos a base de hierbas o dietticos como cohosh azul, cohosh negro, vitex o DHEA  prasterona Puede ser que esta lista no menciona todas las posibles interacciones. Informe a su profesional de Beazer Homesla salud de Ingram Micro Inctodos los productos a base de hierbas, medicamentos de Lakesideventa libre o suplementos nutritivos que est tomando. Si usted fuma, consume bebidas alcohlicas o si utiliza drogas ilegales, indqueselo tambin a su profesional de Beazer Homesla salud. Algunas sustancias pueden interactuar con su medicamento. A qu debo estar atento al usar PPL Corporationeste medicamento? Asegrese que usted sepa cmo y cundo usar PPL Corporationeste medicamento. Debe saber cundo est ovulando y cundo debe Washington Mutualtener relaciones sexuales a fin de incrementar las posibilidades de quedar Neolaembarazada. Visite a su mdico o a su profesional de la salud para chequear su evolucin peridicamente. Es posible que deba controlar sus niveles de hormonas en la sangre o que le indique alguna prueba de orina domiciliaria para Scientist, physiologicalcontrolar la ovulacin. Trate de no faltar a las citas. En comparacin con otros tratamientos para la fertilidad, este medicamento no aumenta mucho la posibilidad de Warehouse managertener un Medical illustratorembarazo mltiple. Aproximadamente 5 de cada 100 mujeres que toman este medicamento tienen la posibilidad de quedar embarazadas con Mohawk Industriesmellizos. Si piensa que est embarazada deje de tomar este medicamento de inmediato y comunquese con su mdico o con su profesional de Radiographer, therapeuticla salud. Este medicamento no es  para tratamientos a Barrister's clerk. La [employer] Farm de las mujeres que se benefician del uso de este medicamento obtienen resultados dentro de los tres  primeros ciclos (meses). Su mdico o su profesional de Theatre manager a Multimedia programmer. Este medicamento generalmente se utiliza durante no ms de 6 ciclos de [place]. Puede experimentar mareos o somnolencia. No conduzca ni utilice maquinaria ni haga nada que Associate Professor en estado de alerta hasta que sepa cmo le afecta este medicamento. No se siente ni se ponga de pie con rapidez. Esto reduce el riesgo de mareos o [employer]. El consumir bebidas alcohlicas o el fumar tabaco puede disminuir las posibilidades de [place]. Limitar o dejar de consumir alcohol o el uso de tabaco durante su tratamiento para la fertilidad. Qu efectos secundarios puedo tener al [employer] este medicamento? Efectos secundarios que debe informar a su mdico o a Barrister's clerk de la salud tan pronto como sea posible:  Chief of Staff como erupcin cutnea, picazn o urticarias, hinchazn de la cara, labios o lengua  problemas respiratorios  cambios en la visin  retencin de lquidos  nuseas, vmito  hinchazn o dolor plvico  dolor abdominal severo  aumento de peso repentino Efectos secundarios que, por lo general, no requieren atencin mdica (debe informarlos a su mdico o a Barrister's clerk de la salud si persisten o si son molestos):  Geneticist, molecular en las mamas  sofocos  molestia plvica leve  nuseas leves Puede ser que esta lista no menciona todos los posibles efectos secundarios. Comunquese a su mdico por asesoramiento mdico Hum[employer] Inc. Usted puede informar los efectos secundarios a la FDA por telfono al 1-800-FDA-1088. Dnde debo guardar mi medicina? Mantngala fuera del alcance de los nios. Gurdela a FPL Group, entre 15 y 84 grados C (9 y 70 grados F). Protjala del calor, la luz y la humedad. Deseche todo el medicamento que no haya utilizado, despus de la fecha de vencimiento. ATENCIN: Este folleto es un resumen. Puede ser que no  cubra toda la posible informacin. Si usted tiene preguntas acerca de esta medicina, consulte con su mdico, su farmacutico o su profesional de Technical sales engineer.  2020 Elsevier/Gold Standard (2014-02-20 00:00:00) Dieta con alto contenido de hierro Iron-Rich Diet  El hierro es un mineral que ayuda al organismo a producir hemoglobina. La hemoglobina es una protena de los glbulos rojos que transporta el oxgeno a los tejidos del cuerpo. Consumir muy poco hierro Motorola se sienta dbil y [place], y aumentar su riesgo de contraer infecciones. El hierro es un componente natural de muchos alimentos y muchos otros alimentos tienen hierro agregado (alimentos fortificados con hierro). Es posible que deba seguir una dieta con alto contenido de hierro si no tiene suficiente hierro en el cuerpo debido a Actuary. La cantidad de potasio que necesita diariamente depende de su edad, su sexo y las afecciones que pueda [place]. Siga las indicaciones de su mdico o un especialista en alimentacin y nutricin (nutricionista) sobre la cantidad de hierro que debera consumir Armed forces operational officer. Cules son algunos consejos para seguir este plan? Leer las etiquetas de los alimentos  Lea las etiquetas de los alimentos para saber la cantidad de miligramos (mg) de hierro que hay en cada porcin. Al cocinar  Cocine los alimentos en ollas de hierro.  Tome estas medidas para que el cuerpo pueda absorber el hierro de ciertos alimentos con ms facilidad: ? Antes de cocinarlos, remoje los frijoles durante la noche. ? Remoje los cereales integrales durante  la noche y culelos antes de usarlos para cocinar. ? Prepare un fermento con las harinas antes del horneado, por ejemplo, usando levadura en la masa del pan. Planificacin de las comidas  Cuando coma alimentos que contengan hierro, debe comerlos con alimentos ricos en vitamina C. Entre ellos, se incluyen las Springhillnaranjas, los pimientos, los tomates, las papas y Social workerel mango. La  vitamina C ayuda al organismo a Set designerabsorber el hierro. Informacin general  Tome los suplementos de hierro solamente como se lo haya indicado el mdico. La sobredosis de hierro puede ser potencialmente mortal. Si le recetan suplementos de hierro, tmelos con jugo de naranja o un suplemento de vitaminaC.  Cuando coma alimentos fortificados con hierro o tome un suplemento de hierro, tambin debe consumir alimentos que contengan hierro naturalmente, como carne, aves y pescado. Consumir alimentos ricos en hierro naturalmente ayuda al organismo a absorber el hierro que se aade a otros alimentos o que contiene un suplemento.  Ciertos alimentos y bebidas impiden que el cuerpo absorba el hierro adecuadamente. No consuma estos alimentos en la misma comida que aquellos con alto contenido de hierro o con suplementos de Company secretaryhierro. Estos alimentos incluyen: ? Caf, t negro y vino tinto. ? Leche, productos lcteos y alimentos con alto contenido de calcio. ? Porotos y soja. ? Cereales integrales. Qu tipos de alimentos debo consumir? Frutas Ciruelas pasas. Pasas de uva. Consuma frutas con alto contenido de vitamina C, por ejemplo, naranjas, pomelos y fresas, junto con alimentos ricos en hierro. Verduras Espinaca (cocida). Guisantes. Brcoli. Verduras fermentadas. Consuma verduras ricas en vitamina C, como las verduras de Gertyhoja, las papas, los morrones y los tomates, junto con los alimentos ricos en hierro. Cereales Cereales para el desayuno fortificados con hierro. Pan de trigo integral fortificado con hierro. Arroz enriquecido. Granos germinados. Carnes y otras protenas Hgado de res. Ostras. Carne de vaca. Camarones. Pavo. Pollo. Atn. Sardinas. Garbanzos. Frutos secos. Tofu. Semillas de calabaza. Bebidas Jugo de tomate. Jugo de naranja recin exprimido. Jugo de ciruelas. T de hibisco. Batidos instantneos fortificados para el desayuno. Dulces y Statisticianpostres Melaza negra. Alios y condimentos Tahini. Salsa  de soja fermentada. Otros alimentos Germen de trigo. Es posible que los productos mencionados arriba no formen una lista completa de las bebidas o los alimentos recomendados. Comunquese con un nutricionista para obtener ms informacin. Qu alimentos debo evitar? Cereales Cereales integrales. Cereal de salvado. Harina de salvado. Avena. Carnes y [address]otras protenas Soja. Productos elaborados a base de protena de la soja. Frijoles negros. Lentejas. Frijoles mungo. Guisantes secos. Lcteos Leche. Crema. Queso. Yogur. Requesn. Bebidas Caf. T negro. Vino tinto. Dulces y ArvinMeritorpostres [place]. Chocolate. Helados. Otros alimentos [place]. Organo. Grandes cantidades de perejil. Es posible que los productos que se enumeran ms arriba no sean una lista completa de los alimentos y las bebidas que se Theatre stage managerdeben evitar. Comunquese con un nutricionista para obtener ms informacin. Resumen  El hierro es un mineral que ayuda al organismo a producir hemoglobina. La hemoglobina es una protena de los glbulos rojos que transporta el oxgeno a los tejidos del cuerpo.  El hierro es un componente natural de muchos alimentos y muchos otros alimentos tienen hierro agregado (alimentos fortificados con hierro).  Cuando coma alimentos que contengan hierro, debe comerlos con alimentos ricos en vitamina C. La vitamina C ayuda al organismo a Set designerabsorber el hierro.  Ciertos alimentos y bebidas impiden que el cuerpo absorba el hierro [address]adamente, como los cereales integrales y los productos lcteos. Debe evitar consumir estos alimentos en la misma comida que  aquellos con alto contenido de hierro o con suplementos de Company secretaryhierro. Esta informacin no tiene Theme park managercomo fin reemplazar el consejo del mdico. Asegrese de hacerle al mdico cualquier pregunta que tenga. Document Released: 10/15/2005 Document Revised: 03/09/2017 Document Reviewed: 03/09/2017 Elsevier Patient Education  2020 ArvinMeritorElsevier Inc.

## 2018-07-23 LAB — FERRITIN: Ferritin: 35 ng/mL (ref 15–150)

## 2018-07-23 LAB — FSH/LH
FSH: 6.4 m[IU]/mL
LH: 7.5 m[IU]/mL

## 2018-07-23 LAB — CBC
Hematocrit: 42 % (ref 34.0–46.6)
Hemoglobin: 13.7 g/dL (ref 11.1–15.9)
MCH: 29.7 pg (ref 26.6–33.0)
MCHC: 32.6 g/dL (ref 31.5–35.7)
MCV: 91 fL (ref 79–97)
Platelets: 334 10*3/uL (ref 150–450)
RBC: 4.61 x10E6/uL (ref 3.77–5.28)
RDW: 12.8 % (ref 11.7–15.4)
WBC: 6.4 10*3/uL (ref 3.4–10.8)

## 2018-07-23 LAB — VITAMIN D 25 HYDROXY (VIT D DEFICIENCY, FRACTURES): Vit D, 25-Hydroxy: 24.9 ng/mL — ABNORMAL LOW (ref 30.0–100.0)

## 2018-08-17 ENCOUNTER — Telehealth: Payer: Self-pay | Admitting: Obstetrics and Gynecology

## 2018-08-17 NOTE — Telephone Encounter (Signed)
Called pt we discussed her medication 

## 2018-08-17 NOTE — Telephone Encounter (Signed)
The pant is requesting a call back from her nurse today if possible in regards to some questions and concerns in regards to her medication. Please advise.

## 2018-09-02 ENCOUNTER — Other Ambulatory Visit: Payer: Self-pay

## 2018-09-02 DIAGNOSIS — Z20822 Contact with and (suspected) exposure to covid-19: Secondary | ICD-10-CM

## 2018-09-03 LAB — NOVEL CORONAVIRUS, NAA: SARS-CoV-2, NAA: NOT DETECTED

## 2018-10-24 ENCOUNTER — Other Ambulatory Visit: Payer: Self-pay

## 2018-10-24 DIAGNOSIS — Z20822 Contact with and (suspected) exposure to covid-19: Secondary | ICD-10-CM

## 2018-10-25 ENCOUNTER — Encounter: Payer: BC Managed Care – PPO | Admitting: Obstetrics and Gynecology

## 2018-10-25 ENCOUNTER — Other Ambulatory Visit: Payer: BC Managed Care – PPO

## 2018-10-25 LAB — NOVEL CORONAVIRUS, NAA: SARS-CoV-2, NAA: NOT DETECTED

## 2019-02-14 ENCOUNTER — Other Ambulatory Visit: Payer: Self-pay | Admitting: Internal Medicine

## 2019-02-14 DIAGNOSIS — Z1231 Encounter for screening mammogram for malignant neoplasm of breast: Secondary | ICD-10-CM

## 2019-03-16 ENCOUNTER — Ambulatory Visit
Admission: RE | Admit: 2019-03-16 | Discharge: 2019-03-16 | Disposition: A | Discharge status: Home / Self Care | Payer: BC Managed Care – PPO | Source: Ambulatory Visit | Attending: Internal Medicine | Admitting: Internal Medicine

## 2019-03-16 DIAGNOSIS — Z1231 Encounter for screening mammogram for malignant neoplasm of breast: Secondary | ICD-10-CM | POA: Diagnosis present

## 2019-05-19 ENCOUNTER — Ambulatory Visit: Payer: BC Managed Care – PPO | Attending: Internal Medicine

## 2019-05-19 DIAGNOSIS — Z23 Encounter for immunization: Secondary | ICD-10-CM

## 2019-05-19 NOTE — Progress Notes (Signed)
   Covid-19 Vaccination Clinic  Name:  Yesenia Davis    MRN: [id number] DOB: Jan 30, 1975  05/19/2019  Yesenia Davis was observed post Covid-19 immunization for 15 minutes without incident. She was provided with Vaccine Information Sheet and instruction to access the V-Safe system.   Yesenia Davis was instructed to call 911 with any severe reactions post vaccine: Marland Kitchen Difficulty breathing  . Swelling of face and throat  . A fast heartbeat  . A bad rash all over body  . Dizziness and weakness   Immunizations Administered    Name Date Dose VIS Date Route   Pfizer COVID-19 Vaccine 05/19/2019 11:55 AM 0.3 mL 03/08/2018 Intramuscular   Manufacturer: [employer], [employer]   Lot: [id number]   NDC: [address]-1

## 2019-06-13 ENCOUNTER — Ambulatory Visit: Payer: BC Managed Care – PPO | Attending: Internal Medicine

## 2019-06-13 DIAGNOSIS — Z23 Encounter for immunization: Secondary | ICD-10-CM

## 2019-06-13 NOTE — Progress Notes (Signed)
   Covid-19 Vaccination Clinic  Name:  Yesenia Davis    MRN: [id number] DOB: September 22, 1975  06/13/2019  Yesenia Davis was observed post Covid-19 immunization for 15 minutes without incident. She was provided with Vaccine Information Sheet and instruction to access the V-Safe system.   Yesenia Davis was instructed to call 911 with any severe reactions post vaccine: Marland Kitchen Difficulty breathing  . Swelling of face and throat  . A fast heartbeat  . A bad rash all over body  . Dizziness and weakness   Immunizations Administered    Name Date Dose VIS Date Route   Pfizer COVID-19 Vaccine 06/13/2019 11:21 AM 0.3 mL 03/08/2018 Intramuscular   Manufacturer: [employer], [employer]   Lot: [id number]   NDC: [address]-5

## 2019-08-04 ENCOUNTER — Other Ambulatory Visit: Payer: Self-pay

## 2019-08-04 ENCOUNTER — Ambulatory Visit (INDEPENDENT_AMBULATORY_CARE_PROVIDER_SITE_OTHER): Payer: BC Managed Care – PPO | Admitting: Certified Nurse Midwife

## 2019-08-04 ENCOUNTER — Encounter: Payer: Self-pay | Admitting: Certified Nurse Midwife

## 2019-08-04 VITALS — BP 100/63 | HR 67 | Ht 63.0 in | Wt 145.2 lb

## 2019-08-04 DIAGNOSIS — R42 Dizziness and giddiness: Secondary | ICD-10-CM | POA: Diagnosis not present

## 2019-08-04 DIAGNOSIS — N644 Mastodynia: Secondary | ICD-10-CM

## 2019-08-04 DIAGNOSIS — R110 Nausea: Secondary | ICD-10-CM | POA: Diagnosis not present

## 2019-08-04 LAB — POCT URINE PREGNANCY: Preg Test, Ur: NEGATIVE

## 2019-08-04 NOTE — Patient Instructions (Addendum)
Breast Tenderness Breast tenderness is a common problem for women of all ages, but may also occur in men. Breast tenderness may range from mild discomfort to severe pain. In women, the pain usually comes and goes with the menstrual cycle, but it can also be constant. Breast tenderness has many possible causes, including hormone changes, infections, and taking certain medicines. You may have tests, such as a mammogram or an ultrasound, to check for any unusual findings. Having breast tenderness usually does not mean that you have breast cancer. Follow these instructions at home: Managing pain and discomfort   If directed, put ice to the painful area. To do this: ? Put ice in a plastic bag. ? Place a towel between your skin and the bag. ? Leave the ice on for 20 minutes, 2-3 times a day.  Wear a supportive bra, especially during exercise. You may also want to wear a supportive bra while sleeping if your breasts are very tender. Medicines  Take over-the-counter and prescription medicines only as told by your health care provider. If the cause of your pain is infection, you may be prescribed an antibiotic medicine.  If you were prescribed an antibiotic, take it as told by your health care provider. Do not stop taking the antibiotic even if you start to feel better. Eating and drinking  Your health care provider may recommend that you lessen the amount of fat in your diet. You can do this by: ? Limiting fried foods. ? Cooking foods using methods such as baking, boiling, grilling, and broiling.  Decrease the amount of caffeine in your diet. Instead, drink more water and choose caffeine-free drinks. General instructions   Keep a log of the days and times when your breasts are most tender.  Ask your health care provider how to do breast exams at home. This will help you notice if you have an unusual growth or lump.  Keep all follow-up visits as told by your health care provider. This is  important. Contact a health care provider if:  Any part of your breast is hard, red, and hot to the touch. This may be a sign of infection.  You are a woman and: ? Not breastfeeding and you have fluid, especially blood or pus, coming out of your nipples. ? Have a new or painful lump in your breast that remains after your menstrual period ends.  You have a fever.  Your pain does not improve or it gets worse.  Your pain is interfering with your daily activities. Summary  Breast tenderness may range from mild discomfort to severe pain.  Breast tenderness has many possible causes, including hormone changes, infections, and taking certain medicines.  It can be treated with ice, wearing a supportive bra, and medicines.  Make changes to your diet if told to by your health care provider. This information is not intended to replace advice given to you by your health care provider. Make sure you discuss any questions you have with your health care provider. Document Revised: 05/23/2018 Document Reviewed: 05/23/2018 Elsevier Patient Education  [place] a la palpacin de las mamas Breast Tenderness El dolor a la palpacin de las mamas es un problema frecuente en las mujeres de todas las edades, pero tambin puede ocurrir en los hombres. El dolor a la palpacin de las mamas puede oscilar desde molestias leves hasta un dolor intenso. En las mujeres, el dolor generalmente es intermitente y se relaciona con el ciclo menstrual, pero puede  ser constante. Son [employer] las causas posibles del dolor a la palpacin de las [place], incluidos los cambios hormonales, infecciones y el uso de ciertos medicamentos. Puede someterse a pruebas como una mamografa o una ecografa, para Copywriter, advertising inusuales. Por lo general, el dolor a la palpacin de las mamas no significa que usted Primary school teacher de mama. Siga estas instrucciones en su casa: Control del dolor y de las molestias   Si se lo  indican, aplique hielo sobre la zona dolorida. Para hacer esto: ? Ponga el hielo en una bolsa plstica. ? Coloque una [employer] piel y Therapist, nutritional. ? Coloque el hielo durante 22mnutos, 2 a 3veces por da.  Use un sostn de soporte, especialmente al hacer actividad fsica. Quizs tambin desee usar ese sostn para dormir, si siente las mHewlett-Packard Medicamentos  Use los medicamentos de venta libre y los recetados solamente como se lo haya indicado el mdico. Si el dolor es causado por alguna infeccin, posiblemente le receten un antibitico.  Si le recetaron un antibitico, tmelo o selo como se lo haya indicado el mdico. No deje de tomar los antibiticos aunque comience a sSports administrator Comida y bebida  El mdico puede recomendarle disminuir el consumo de grasa en su dieta. Puede hacer lo siguiente: ? Limite el consumo de alimentos fritos. ? A la hora de cocinarlos, opte por hornearlos, hervirlos, grillarlos y asarlos a lAdministrator, arts  Disminuya la cantidad de cafena de su dieta. En cambio, beba ms agua y elija bebidas sin cafena. Instrucciones generales   Lleve un registro de los das y las horas cuando tiene mayor sensibilidad en las mHeber  Pregntele al mdico cmo debe hacerse los exmenes de mamas en su casa. Esto la ayudar a notar si tiene algn crecimiento o bulto fuera de lo normal.  Concurra a todas las visitas de seguimiento como se lo haya indicado el mdico. Esto es importante. Comunquese con un mdico si:  Cualquier zona de la mama est dura, enrojecida y caliente al tacto. Puede ser un signo de infeccin.  Es mArt therapisty: ? No est en etapa de lactancia y lMarshall & Ilsleylquido de los pezones, especialmente sangre o pus. ? Tiene un bulto nuevo o doloroso en la mama que no desaparece despus de la finalizacin del perodo menstrual.  Tiene fiebre.  El dolor no mejora o eMilton  El dAirline pilotimpide realizar las actividades cotidianas. Resumen  El dolor a  la palpacin de las mamas puede oscilar desde molestias leves hasta un dolor intenso.  Son mSouthern Companylas causas posibles del dolor a la palpacin de las m[place]orp incluidos los cambios hormonales, infecciones y el uso de ciertos medicamentos.  Puede tratarse con hielo, el uso un sostn de soporte y mDynegy  Haga cambios en la dieta como se lo haya indicado el mdico. Esta informacin no tiene cMarine scientistel consejo del mdico. Asegrese de hacerle al mdico cualquier pregunta que tenga. Document Revised: 07/06/2018 Document Reviewed: 07/06/2018 Elsevier Patient Education  2Dibollpreventivos en las mujeres de 442a [id number] Old, Female Los cuidados preventivos hacen referencia a las opciones en cuanto a las visitas al mdico y al estilo de vida, las cuales pueden promover la salud y eMusician Esto puede comprender lo siguiente:  Un examen fsico anual. Esto tambin se puede llamar control de bienestar anual.  Visitas regulares al dentista y exmenes oculares.  Vacunas.  Estudios para dHydrographic surveyor  ciertas enfermedades.  Opciones saludables de estilo de vida, como seguir una dieta saludable, hacer ejercicio regularmente, no usar drogas ni productos que contengan nicotina y tabaco, y limitar el consumo de alcohol. Qu puedo esperar para mi visita de cuidado preventivo? Examen fsico El mdico revisar lo siguiente:  IT consultant y [place]. Esto se puede usar para calcular el ndice de masa corporal ([place]), que indica si tiene un peso saludable.  Frecuencia cardaca y presin arterial.  Piel para detectar manchas anormales. Asesoramiento Su mdico puede preguntarle acerca de:  Consumo de tabaco, alcohol y drogas.  Su bienestar emocional.  El bienestar en el hogar y sus relaciones personales.  Su actividad sexual.  Sus hbitos de alimentacin.  Su trabajo y [place] laboral.  Mtodos anticonceptivos.  Su ciclo  menstrual.  Sus antecedentes de Media planner. Qu vacunas necesito?  [place] antigripal  Se recomienda aplicarse esta vacuna todos los [place]. Vacuna contra el ttanos, difteria y tos ferina (Tdap)  Es posible que tenga que aplicarse un refuerzo contra el ttanos y la difteria (DT) cada 10aos. Vacuna contra la varicela  Es posible que tenga que aplicrsela si no recibi esta vacuna. Vacuna contra el herpes zster (culebrilla)  Es posible que la necesite despus de los 2 aos de [place]. Vacuna contra el sarampin, rubola y paperas (SRP)  Es posible que necesite aplicarse al menos una dosis de la vacuna SRP si naci despus de [phone]. Podra tambin necesitar una segunda dosis. Vacuna antineumoccica conjugada (PCV13)  Puede necesitar esta vacuna si tiene determinadas enfermedades y no se vacun anteriormente. Edward [place]olly antineumoccica de polisacridos (PPSV23)  Quizs tenga que aplicarse una o dos dosis si fuma o si tiene determinadas afecciones. Edward Jolly antimeningoccica conjugada (MenACWY)  Puede necesitar esta vacuna si tiene determinadas afecciones. Vacuna contra la hepatitis A  Es posible que necesite esta vacuna si tiene ciertas afecciones o si viaja o trabaja en lugares en los que podra estar expuesta a la hepatitis A. Vacuna contra la hepatitis B  Es posible que necesite esta vacuna si tiene ciertas afecciones o si viaja o trabaja en lugares en los que podra estar expuesta a la hepatitis B. Vacuna antihaemophilus influenzae tipo B (Hib)  Puede necesitar esta vacuna si tiene determinadas afecciones. Vacuna contra el virus del papiloma humano (VPH)  Si el mdico se lo recomienda, Research scientist (physical sciences) tres dosis a lo largo de 6 meses. Puede recibir las vacunas en forma de dosis individuales o en forma de dos o ms vacunas juntas en la misma inyeccin (vacunas combinadas). Hable con su mdico [employer] y beneficios de las vacunas combinadas. Qu pruebas necesito? Anlisis de  [employer] de lpidos y colesterol. Estos se pueden verificar cada 5 aos o, con ms frecuencia, si usted tiene ms de 64 aos de edad.  Anlisis de hepatitisC.  Anlisis de hepatitisB. Pruebas de deteccin  Pruebas de deteccin de cncer de pulmn. Es posible que se le realice esta prueba de deteccin a partir de los 75 aos de edad, si ha fumado durante 30 aos un paquete diario y sigue fumando o dej el hbito en algn momento en los ltimos 15 aos.  Pruebas de Programme researcher, broadcasting/film/video de Surveyor, minerals. Todos los adultos a partir de los 42 aos de edad y [place] 3 aos de edad deben hacerse esta prueba de deteccin. El mdico puede recomendarle las pruebas de deteccin a partir de los 98 aos de edad si corre un mayor riesgo. Le realizarn pruebas cada 1 a 10 aos, segn  los resultados y el tipo de prueba de Programme researcher, broadcasting/film/video.  Pruebas de deteccin de la diabetes. Esto se Set designer un control del azcar en la sangre (glucosa) despus de no haber comido durante un periodo de tiempo (ayuno). Es posible que se le realice esta prueba cada 1 a 3 aos.  Mamografa. Se puede realizar cada 1 o 2 aos. Hable con su mdico sobre cundo debe comenzar a Engineer, manufacturing de [place] regular. Esto depende de si tiene antecedentes familiares de cncer de mama o no.  Pruebas de deteccin de cncer relacionado con las mutaciones del BRCA. Es posible que se las deba realizar si tiene antecedentes de cncer de mama, de ovario, de trompas o peritoneal.  Examen plvico y prueba de Papanicolaou. Esto se puede realizar cada 54aos a Renato Gails de los 21aos de edad. A partir de los 30 aos, esto se puede Optometrist cada 5 aos si usted se realiza una prueba de Papanicolaou en combinacin con una prueba de deteccin del virus del papiloma humano (VPH). Otras pruebas  Anlisis de enfermedades de transmisin sexual (ETS).  Densitometra sea. Esto se realiza para detectar osteoporosis. Se le puede realizar este  examen de deteccin si tiene un riesgo alto de tener osteoporosis. Siga estas instrucciones en su casa: Comida y bebida  Siga una dieta que incluya frutas frescas y verduras, cereales integrales, lcteos descremados y protenas magras.  Tome los suplementos vitamnicos y [employer] se lo haya indicado el mdico.  No beba alcohol si: ? Su mdico le indica no hacerlo. ? Est embarazada, puede estar embarazada o est tratando de quedar embarazada.  Si bebe alcohol: ? Limite la cantidad que consume de 0 a 1 medida por da. ? Est atenta a la cantidad de alcohol que hay en las bebidas que toma. En los [place], una medida equivale a una botella de cerveza de 12oz (334m), un vaso de vino de 5oz (1462m o un vaso de una bebida alcohlica de alta graduacin de 1oz (441m Estilo de vidNavistar International Corporationlas encas a diario.  Mantngase activa. Haga al menos 15m57mos de ejercicio 5o ms das Hilton Hotelso consuma ningn producto que contenga nicotina o tabaco, como cigarrillos, cigarrillos electrnicos y tabaco de mascHigher education careers adviser necesita ayuda para dejar de fumar, consulte al mdico.  Si es sexualmente activa, practique sexo seguro. Use un condn u otra forma de mtodo anticonceptivo (anticonceptivos) a fin de evitEnvironmental health practitionerTS (infecciones de transmisin sexual).  Si el mdico se lo indic, tome una dosis baja de aspirina diariamente a partir de los 50 a30 de edadFranklin Parkndo volver?  Visite al mdico una vez al ao para una visita de control.  Pregntele al mdico con qu frecuencia debe realizarse un control de la vista y los dientes.  Mantenga su esquema de vacunacin al da. Esta informacin no tiene comoMarine scientistconsejo del mdico. Asegrese de hacerle al mdico cualquier pregunta que tenga. Document Revised: 10/14/2017 Document Reviewed: 10/14/2017 Elsevier Patient Education  2020 Elsevier Inc.   Preventive Care [id number] Old,  Female Preventive care refers to visits with your health care provider and lifestyle choices that can promote health and wellness. This includes:  A yearly physical exam. This may also be called an annual well check.  Regular dental visits and eye exams.  Immunizations.  Screening for certain conditions.  Healthy lifestyle choices, such as eating a healthy diet, getting regular exercise, not using drugs or products that contain  nicotine and tobacco, and limiting alcohol use. What can I expect for my preventive care visit? Physical exam Your health care provider will check your:  Height and weight. This may be used to calculate body mass index (BMI), which tells if you are at a healthy weight.  Heart rate and blood pressure.  Skin for abnormal spots. Counseling Your health care provider may ask you questions about your:  Alcohol, tobacco, and drug use.  Emotional well-being.  Home and relationship well-being.  Sexual activity.  Eating habits.  Work and work Statistician.  Method of birth control.  Menstrual cycle.  Pregnancy history. What immunizations do I need?  Influenza (flu) vaccine  This is recommended every year. Tetanus, diphtheria, and pertussis (Tdap) vaccine  You may need a Td booster every 10 years. Varicella (chickenpox) vaccine  You may need this if you have not been vaccinated. Zoster (shingles) vaccine  You may need this after age 40. Measles, mumps, and rubella (MMR) vaccine  You may need at least one dose of MMR if you were born in 1957 or later. You may also need a second dose. Pneumococcal conjugate (PCV13) vaccine  You may need this if you have certain conditions and were not previously vaccinated. Pneumococcal polysaccharide (PPSV23) vaccine  You may need one or two doses if you smoke cigarettes or if you have certain conditions. Meningococcal conjugate (MenACWY) vaccine  You may need this if you have certain conditions. Hepatitis  A vaccine  You may need this if you have certain conditions or if you travel or work in places where you may be exposed to hepatitis A. Hepatitis B vaccine  You may need this if you have certain conditions or if you travel or work in places where you may be exposed to hepatitis B. Haemophilus influenzae type b (Hib) vaccine  You may need this if you have certain conditions. Human papillomavirus (HPV) vaccine  If recommended by your health care provider, you may need three doses over 6 months. You may receive vaccines as individual doses or as more than one vaccine together in one shot (combination vaccines). Talk with your health care provider about the risks and benefits of combination vaccines. What tests do I need? Blood tests  Lipid and cholesterol levels. These may be checked every 5 years, or more frequently if you are over 32 years old.  Hepatitis C test.  Hepatitis B test. Screening  Lung cancer screening. You may have this screening every year starting at age 8 if you have a 30-pack-year history of smoking and currently smoke or have quit within the past 15 years.  Colorectal cancer screening. All adults should have this screening starting at age 72 and continuing until age 85. Your health care provider may recommend screening at age 72 if you are at increased risk. You will have tests every 1-10 years, depending on your results and the type of screening test.  Diabetes screening. This is done by checking your blood sugar (glucose) after you have not eaten for a while (fasting). You may have this done every 1-3 years.  Mammogram. This may be done every 1-2 years. Talk with your health care provider about when you should start having regular mammograms. This may depend on whether you have a family history of breast cancer.  BRCA-related cancer screening. This may be done if you have a family history of breast, ovarian, tubal, or peritoneal cancers.  Pelvic exam and Pap test.  This may be done every 3 years  starting at age 67. Starting at age 79, this may be done every 5 years if you have a Pap test in combination with an HPV test. Other tests  Sexually transmitted disease (STD) testing.  Bone density scan. This is done to screen for osteoporosis. You may have this scan if you are at high risk for osteoporosis. Follow these instructions at home: Eating and drinking  Eat a diet that includes fresh fruits and vegetables, whole grains, lean protein, and low-fat dairy.  Take vitamin and mineral supplements as recommended by your health care provider.  Do not drink alcohol if: ? Your health care provider tells you not to drink. ? You are pregnant, may be pregnant, or are planning to become pregnant.  If you drink alcohol: ? Limit how much you have to 0-1 drink a day. ? Be aware of how much alcohol is in your drink. In the [place] one drink equals one 12 oz bottle of beer (355 mL), one 5 oz glass of wine (148 mL), or one 1 oz glass of hard liquor (44 mL). Lifestyle  Take daily care of your teeth and gums.  Stay active. Exercise for at least 30 minutes on 5 or more days each week.  Do not use any products that contain nicotine or tobacco, such as cigarettes, e-cigarettes, and chewing tobacco. If you need help quitting, ask your health care provider.  If you are sexually active, practice safe sex. Use a condom or other form of birth control (contraception) in order to prevent pregnancy and STIs (sexually transmitted infections).  If told by your health care provider, take low-dose aspirin daily starting at age 33. What's next?  Visit your health care provider once a year for a well check visit.  Ask your health care provider how often you should have your eyes and teeth checked.  Stay up to date on all vaccines. This information is not intended to replace advice given to you by your health care provider. Make sure you discuss any questions you have with your  health care provider. Document Revised: 09/09/2017 Document Reviewed: 09/09/2017 Elsevier Patient Education  2020 [employer].

## 2019-08-04 NOTE — Progress Notes (Signed)
GYN ENCOUNTER NOTE  Subjective:       Yesenia Davis is a 44 y.o. [phone] female is here for gynecologic evaluation of the following issues:  1. Dizziness, history of vertigo 2. Nausea 3. Breast tenderness  Reports symptoms since last week. Trying to conceive. Has not taken home pregnancy test.   Was seen at PCP on Monday for treatment of vertigo.   Denies difficulty breathing or respiratory distress, chest pain, abdominal pain, excessive vaginal bleeding, dysuria, and leg pain or swelling.    Gynecologic History  Patient's last menstrual period was 07/10/2019 (exact date).  Contraception: none  Last Pap: 01/2018. Results were: Neg/Neg  Last mammogram: 03/2019. Results were: normal  Obstetric History  OB History  Gravida Para Term Preterm AB Living  4 3 3   1 3   SAB TAB Ectopic Multiple Live Births  1     0 3    # Outcome Date GA Lbr Len/2nd Weight Sex Delivery Anes PTL Lv  4 Term 10/18/15 [redacted]w[redacted]d / 00:06 6 lb 14.1 oz (3.12 kg) M Vag-Spont None  LIV  3 Term 2005   7 lb 1.8 oz (3.225 kg) M Vag-Spont   LIV  2 SAB 2003          1 Term 1996   7 lb 3.2 oz (3.266 kg) F Vag-Spont   LIV    Past Medical History:  Diagnosis Date  . Acne vulgaris   . Epigastric pain   . Gastritis   . GERD (gastroesophageal reflux disease)   . Gestational diabetes   . Migraines     Past Surgical History:  Procedure Laterality Date  . CHOLECYSTECTOMY  1996  . cyst removed     from neck- b9  . DILATION AND CURETTAGE OF UTERUS      Current Outpatient Medications on File Prior to Visit  Medication Sig Dispense Refill  . acetaminophen (TYLENOL) 500 MG chewable tablet Chew 500 mg by mouth every 6 (six) hours as needed for pain.    . [employer] ACID CONTROLLER 10 MG tablet Take 10 mg by mouth 2 (two) times daily.    2004 docusate calcium (SURFAK) 240 MG capsule Take 240 mg by mouth daily as needed.    . Prenatal-DSS-FeCb-FeGl-FA (CITRANATAL BLOOM) 90-1 MG TABS Take 1 tablet by mouth daily. 30 tablet  11  . VITAMIN D-1000 MAX ST 25 MCG (1000 UT) tablet Take 1,000 Units by mouth daily.     No current facility-administered medications on file prior to visit.    Allergies  Allergen Reactions  . Ciprofloxacin Hcl Rash    Social History   Socioeconomic History  . Marital status: Single    Spouse name: Not on file  . Number of children: Not on file  . Years of education: Not on file  . Highest education level: Not on file  Occupational History  . Not on file  Tobacco Use  . Smoking status: Never Smoker  . Smokeless tobacco: Never Used  Vaping Use  . Vaping Use: Never used  Substance and Sexual Activity  . Alcohol use: No  . Drug use: No  . Sexual activity: Yes    Birth control/protection: None  Other Topics Concern  . Not on file  Social History Narrative  . Not on file   Social Determinants of Health   Financial Resource Strain:   . Difficulty of Paying Living Expenses:   Food Insecurity:   . Worried About Marland Kitchen in the Last  Year:   . Ran Out of Food in the Last Year:   Transportation Needs:   . Freight forwarderLack of Transportation (Medical):   Marland Kitchen. Lack of Transportation (Non-Medical):   Physical Activity:   . Days of Exercise per Week:   . Minutes of Exercise per Session:   Stress:   . Feeling of Stress :   Social Connections:   . Frequency of Communication with Friends and Family:   . Frequency of Social Gatherings with Friends and Family:   . Attends Religious Services:   . Active Member of Clubs or Organizations:   . Attends BankerClub or Organization Meetings:   Marland Kitchen. Marital Status:   Intimate Partner Violence:   . Fear of Current or Ex-Partner:   . Emotionally Abused:   Marland Kitchen. Physically Abused:   . Sexually Abused:     Family History  Problem Relation Age of Onset  . Breast cancer Maternal Aunt        40's  . Diabetes Father   . Diabetes Mother   . Ovarian cancer Neg Hx   . Colon cancer Neg Hx   . Heart disease Neg Hx     The following portions of the  patient's history were reviewed and updated as appropriate: allergies, current medications, past family history, past medical history, past social history, past surgical history and problem list.  Review of Systems  ROS negative except as noted above. Information obtained from patient.   Objective:   BP (!) 100/63   Pulse 67   Ht 5\' 3"  (1.6 m)   Wt 145 lb 4 oz (65.9 kg)   LMP 07/10/2019 (Exact Date)   BMI 25.73 kg/m    CONSTITUTIONAL: Well-developed, well-nourished female in no acute distress.   PHYSICAL EXAM: Not indicated.   Recent Results (from the past 2160 hour(s))  Beta hCG quant (ref lab)     Status: None   Collection Time: 08/04/19  4:19 PM  Result Value Ref Range   hCG Quant <1 mIU/mL    Comment:                      Female (Non-pregnant)    0 -     5                             (Postmenopausal)  0 -     8                      Female (Pregnant)                      Weeks of Gestation                              3                6 -    71                              4               10 -   750                              5  217 -  7138                              6              158 - 31795                              7             [id number]                              8            32065 -[id number]                              9            [id number] -[id number]                             10            46509 -[id number]                             12            27832 -[id number]                             14            13950 - 62530                             15            12039 - 70971                             16             9040 - 56451                             17             [id number] - [id number]                             18             8099 - 58176 Roche E CLIA methodology   CBC     Status: None   Collection Time: 08/04/19  4:19 PM  Result Value Ref Range   WBC 9.0 3.4 - 10.8 x10E3/uL   RBC 4.73 3.77 - 5.28 x10E6/uL   Hemoglobin 13.2 11.1 - 15.9 g/dL    Hematocrit 42.7 06.2 - 46.6 %   MCV 89 79 - 97 fL   MCH 27.9 26.6 - 33.0 pg   MCHC 31.5 31 - 35 g/dL   RDW 37.6 28.3 - 15.1 %   Platelets 327 150 - 450 x10E3/uL  POCT urine pregnancy     Status: None   Collection Time: 08/04/19  4:33 PM  Result Value Ref Range   Preg Test, Ur Negative Negative    Assessment:   1. Dizziness  - CBC  2. Nausea  - Beta hCG quant (ref lab)  3. Breast tenderness  - Beta hCG quant (ref lab)     Plan:   Labs today, see orders. Will contact patient with results.   Reviewed red flag symptoms and when to call.   RTC as needed.   Serafina Royals, CNM [facility], [facility]

## 2019-08-05 LAB — BETA HCG QUANT (REF LAB): hCG Quant: <1 m[IU]/mL

## 2019-08-05 LAB — CBC
Hematocrit: 41.9 % (ref 34.0–46.6)
Hemoglobin: 13.2 g/dL (ref 11.1–15.9)
MCH: 27.9 pg (ref 26.6–33.0)
MCHC: 31.5 g/dL (ref 31.5–35.7)
MCV: 89 fL (ref 79–97)
Platelets: 327 10*3/uL (ref 150–450)
RBC: 4.73 x10E6/uL (ref 3.77–5.28)
RDW: 12.2 % (ref 11.7–15.4)
WBC: 9 10*3/uL (ref 3.4–10.8)

## 2019-08-14 ENCOUNTER — Telehealth: Payer: Self-pay | Admitting: Certified Nurse Midwife

## 2019-08-14 NOTE — Telephone Encounter (Signed)
Can you please contact the patient with her results? I released to her in MyChart, but I guess she doesn't have access. Thanks, Serafina Royals, CNM

## 2019-08-14 NOTE — Telephone Encounter (Signed)
Notified patient of lab results. Patient stated that you and her discussed having an [place] to determine if uterus is plan to do therapy.

## 2019-08-14 NOTE — Telephone Encounter (Signed)
Pt called in and stated that she was seen 7/23 and that she hasn't been called about her blood work. I the the pt I would send a message to the provider. The pt verbally understood. Please advise

## 2019-08-14 NOTE — Telephone Encounter (Signed)
Please schedule ultrasound for infertility as desired. Thanks, Serafina Royals, CNM

## 2019-08-15 NOTE — Telephone Encounter (Signed)
Can you please call the patient to schedule [place].

## 2019-08-15 NOTE — Telephone Encounter (Signed)
Called patient with the help of the language line. Was unable to reach patient with the number on file- her voicemail box has not been set up so I was unable to leave a message for her.

## 2019-08-22 ENCOUNTER — Telehealth: Payer: Self-pay | Admitting: Certified Nurse Midwife

## 2019-08-22 NOTE — Telephone Encounter (Signed)
Patient called in stating that her previous appointment with her provider that she was supposed to refer her to go to therapy for her uterus. Patient stated that her provider wanted her to have an ultrasound before sending her to said therapy. Informed her I would send a message to her provider to get some clarity on the situation and to see what exactly she needed to be scheduled for. Patient verbalized understanding.   Could you please advise?

## 2019-08-25 ENCOUNTER — Other Ambulatory Visit: Payer: Self-pay | Admitting: Certified Nurse Midwife

## 2019-08-25 DIAGNOSIS — R102 Pelvic and perineal pain: Secondary | ICD-10-CM

## 2019-08-25 DIAGNOSIS — N979 Female infertility, unspecified: Secondary | ICD-10-CM

## 2019-08-25 DIAGNOSIS — N920 Excessive and frequent menstruation with regular cycle: Secondary | ICD-10-CM

## 2019-08-25 NOTE — Telephone Encounter (Signed)
Please contact patient to scheduled appointment for ultrasound and results review with me. Thanks, Serafina Royals, CNM

## 2019-08-25 NOTE — Progress Notes (Signed)
Ultrasound orders placed.    Serafina Royals, CNM [facility], [facility] 08/25/19 4:53 PM

## 2019-08-28 NOTE — Telephone Encounter (Signed)
Patient has been scheduled.  Thank you.

## 2019-09-14 ENCOUNTER — Encounter: Payer: Self-pay | Admitting: Certified Nurse Midwife

## 2019-09-14 ENCOUNTER — Ambulatory Visit (INDEPENDENT_AMBULATORY_CARE_PROVIDER_SITE_OTHER): Payer: BC Managed Care – PPO

## 2019-09-14 ENCOUNTER — Ambulatory Visit (INDEPENDENT_AMBULATORY_CARE_PROVIDER_SITE_OTHER): Payer: BC Managed Care – PPO | Admitting: Dermatology

## 2019-09-14 ENCOUNTER — Ambulatory Visit (INDEPENDENT_AMBULATORY_CARE_PROVIDER_SITE_OTHER): Payer: BC Managed Care – PPO | Admitting: Certified Nurse Midwife

## 2019-09-14 ENCOUNTER — Other Ambulatory Visit: Payer: Self-pay

## 2019-09-14 ENCOUNTER — Encounter: Payer: Self-pay | Admitting: Dermatology

## 2019-09-14 VITALS — BP 100/69 | HR 63 | Ht 63.0 in | Wt 146.5 lb

## 2019-09-14 DIAGNOSIS — N979 Female infertility, unspecified: Secondary | ICD-10-CM

## 2019-09-14 DIAGNOSIS — R102 Pelvic and perineal pain: Secondary | ICD-10-CM | POA: Diagnosis not present

## 2019-09-14 DIAGNOSIS — I781 Nevus, non-neoplastic: Secondary | ICD-10-CM

## 2019-09-14 DIAGNOSIS — L853 Xerosis cutis: Secondary | ICD-10-CM

## 2019-09-14 DIAGNOSIS — L739 Follicular disorder, unspecified: Secondary | ICD-10-CM | POA: Diagnosis not present

## 2019-09-14 DIAGNOSIS — N920 Excessive and frequent menstruation with regular cycle: Secondary | ICD-10-CM

## 2019-09-14 DIAGNOSIS — L659 Nonscarring hair loss, unspecified: Secondary | ICD-10-CM

## 2019-09-14 DIAGNOSIS — R21 Rash and other nonspecific skin eruption: Secondary | ICD-10-CM | POA: Diagnosis not present

## 2019-09-14 MED ORDER — TRIAMCINOLONE ACETONIDE 0.1 % EX OINT: 1.0000 "application " | TOPICAL_OINTMENT | Freq: Two times a day (BID) | CUTANEOUS | 0 refills | Status: DC

## 2019-09-14 NOTE — Patient Instructions (Addendum)
Mittelschmerz Mittelschmerz is pain in the belly (abdomen) that affects women. The pain happens between periods. The pain:  Affects one side of the belly. The side may change from month to month.  May be mild or very bad.  May last from minutes to hours. It does not last longer than 1-2 days.  Can happen with a feeling of being sick to your stomach (nausea).  Can happen with a small amount of bleeding from your vagina. Mittelschmerz is caused by the growth and release of an egg from an ovary. It is a natural part of the ovulation cycle. It often happens about 2 weeks after a woman's period ends. Follow these instructions at home: Watch for any changes in your condition. Let your doctor know about them. Take these actions to help with your pain:  Try soaking in a hot bath.  Try a heating pad to relax the muscles in the belly.  Take over-the-counter and prescription medicines only as told by your doctor.  Keep all follow-up visits as told by your doctor. This is important. Contact a doctor if:  The pain is very bad most months.  The pain lasts longer than 24 hours.  Your pain medicine is not helping.  You have a fever.  You feel sick to your stomach, and the feeling does not go away.  You keep throwing up (vomiting).  You miss your period.  You have more than a small amount of blood coming from your vagina between periods. Summary  Mittelschmerz is a condition that affects women. It is pain in the belly that happens between periods.  This condition is caused by the growth and release of an egg from an ovary.  The pain may affect one side of the belly, may be mild or very bad, or may cause you to feel sick to your stomach.  To help with your pain, soak in a hot bath, use a heating pad, or take medicines.  Watch for any changes in your condition. Know when to contact a doctor for help. This information is not intended to replace advice given to you by your health care  provider. Make sure you discuss any questions you have with your health care provider. Document Revised: 07/13/2017 Document Reviewed: 07/13/2017 Elsevier Patient Education  2020 Elsevier Inc.   Ovarian Cyst     An ovarian cyst is a fluid-filled sac that forms on an ovary. The ovaries are small organs that produce eggs in women. Various types of cysts can form on the ovaries. Some may cause symptoms and require treatment. Most ovarian cysts go away on their own, are not cancerous (are benign), and do not cause problems. Common types of ovarian cysts include:  Functional (follicle) cysts. ? Occur during the menstrual cycle, and usually go away with the next menstrual cycle if you do not get pregnant. ? Usually cause no symptoms.  Endometriomas. ? Are cysts that form from the tissue that lines the uterus (endometrium). ? Are sometimes called "chocolate cysts" because they become filled with blood that turns brown. ? Can cause pain in the lower abdomen during intercourse and during your period.  Cystadenoma cysts. ? Develop from cells on the outside surface of the ovary. ? Can get very large and cause lower abdomen pain and pain with intercourse. ? Can cause severe pain if they twist or break open (rupture).  Dermoid cysts. ? Are sometimes found in both ovaries. ? May contain different kinds of body tissue, such as skin,  teeth, hair, or cartilage. ? Usually do not cause symptoms unless they get very big.  Theca lutein cysts. ? Occur when too much of a certain hormone (human chorionic gonadotropin) is produced and overstimulates the ovaries to produce an egg. ? Are most common after having procedures used to assist with the conception of a baby (in vitro fertilization). What are the causes? Ovarian cysts may be caused by:  Ovarian hyperstimulation syndrome. This is a condition that can develop from taking fertility medicines. It causes multiple large ovarian cysts to  form.  Polycystic ovarian syndrome (PCOS). This is a common hormonal disorder that can cause ovarian cysts, as well as problems with your period or fertility. What increases the risk? The following factors may make you more likely to develop ovarian cysts:  Being overweight or obese.  Taking fertility medicines.  Taking certain forms of hormonal birth control.  Smoking. What are the signs or symptoms? Many ovarian cysts do not cause symptoms. If symptoms are present, they may include:  Pelvic pain or pressure.  Pain in the lower abdomen.  Pain during sex.  Abdominal swelling.  Abnormal menstrual periods.  Increasing pain with menstrual periods. How is this diagnosed? These cysts are commonly found during a routine pelvic exam. You may have tests to find out more about the cyst, such as:  Ultrasound.  X-ray of the pelvis.  CT scan.  MRI.  Blood tests. How is this treated? Many ovarian cysts go away on their own without treatment. Your health care provider may want to check your cyst regularly for 2-3 months to see if it changes. If you are in menopause, it is especially important to have your cyst monitored closely because menopausal women have a higher rate of ovarian cancer. When treatment is needed, it may include:  Medicines to help relieve pain.  A procedure to drain the cyst (aspiration).  Surgery to remove the whole cyst.  Hormone treatment or birth control pills. These methods are sometimes used to help dissolve a cyst. Follow these instructions at home:  Take over-the-counter and prescription medicines only as told by your health care provider.  Do not drive or use heavy machinery while taking prescription pain medicine.  Get regular pelvic exams and Pap tests as often as told by your health care provider.  Return to your normal activities as told by your health care provider. Ask your health care provider what activities are safe for you.  Do not  use any products that contain nicotine or tobacco, such as cigarettes and e-cigarettes. If you need help quitting, ask your health care provider.  Keep all follow-up visits as told by your health care provider. This is important. Contact a health care provider if:  Your periods are late, irregular, or painful, or they stop.  You have pelvic pain that does not go away.  You have pressure on your bladder or trouble emptying your bladder completely.  You have pain during sex.  You have any of the following in your abdomen: ? A feeling of fullness. ? Pressure. ? Discomfort. ? Pain that does not go away. ? Swelling.  You feel generally ill.  You become constipated.  You lose your appetite.  You develop severe acne.  You start to have more body hair and facial hair.  You are gaining weight or losing weight without changing your exercise and eating habits.  You think you may be pregnant. Get help right away if:  You have abdominal pain that is  severe or gets worse.  You cannot eat or drink without vomiting.  You suddenly develop a fever.  Your menstrual period is much heavier than usual. This information is not intended to replace advice given to you by your health care provider. Make sure you discuss any questions you have with your health care provider. Document Revised: 03/29/2017 Document Reviewed: 06/02/2015 Elsevier Patient Education  2020 Elsevier Inc.  Ethinyl Estradiol; Norethindrone Acetate; Ferrous fumarate tablets or capsules What is this medicine? ETHINYL ESTRADIOL; NORETHINDRONE ACETATE; FERROUS FUMARATE (ETH in il es tra DYE ole; nor eth IN drone AS e tate; FER [place] FUE ma rate) is an oral contraceptive. The products combine two types of female hormones, an estrogen and a progestin. They are used to prevent ovulation and pregnancy. Some products are also used to treat acne in females. This medicine may be used for other purposes; ask your health care provider  or pharmacist if you have questions. COMMON BRAND NAME(S): Aurovela [address] 1/20, Aurovela Fe, [place] [address], [address] Fe, [place] Fe, [address], [place] Fe 1.5/30, [place]ild[place] Fe 1/20, Hailey 24 Fe, Hailey Fe 1.5/30, Junel Fe 1.5/30, Junel Fe 1/20, Junel Fe 24, [place] Fe, Lo Loestrin Fe, Loestrin 24 Fe, Loestrin FE 1.5/30, Loestrin FE 1/20, Lom[place] 24 Fe, Microgestin 24 Fe, Microgestin Fe 1.5/30, Microgestin Fe 1/20, Tarina 24 Fe, Tarina Fe 1/20, [place], Tilia Fe, Tri-Legest Fe What should I tell my health care provider before I take this medicine? They need to know if you have any of these conditions:  abnormal vaginal bleeding  blood vessel disease  breast, cervical, endometrial, ovarian, liver, or uterine cancer  diabetes  gallbladder disease  heart disease or recent heart attack  high blood pressure  high cholesterol  history of blood clots  kidney disease  liver disease  migraine headaches  smoke tobacco  stroke  systemic lupus erythematosus (SLE)  an unusual or allergic reaction to estrogens, progestins, other medicines, foods, dyes, or preservatives  pregnant or trying to get pregnant  breast-feeding How should I use this medicine? Take this medicine by mouth. To reduce nausea, this medicine may be taken with food. Follow the directions on the prescription label. Take this medicine at the same time each day and in the order directed on the package. Do not take your medicine more often than directed. A patient package insert for the product will be given with each prescription and refill. Read this sheet carefully each time. The sheet may change frequently. Contact your pediatrician regarding the use of this medicine in children. Special care may be needed. This medicine has been used in female children who have started having menstrual periods. Overdosage: If you think you have taken too much of this medicine contact a poison control center or emergency room at  once. NOTE: This medicine is only for you. Do not share this medicine with others. What if I miss a dose? If you miss a dose, refer to the patient information sheet you received with your medicine for direction. If you miss more than one pill, this medicine may not be as effective and you may need to use another form of birth control. What may interact with this medicine? Do not take this medicine with the following medication:  dasabuvir; ombitasvir; paritaprevir; ritonavir  ombitasvir; paritaprevir; ritonavir This medicine may also interact with the following medications:  acetaminophen  antibiotics or medicines for infections, especially rifampin, rifabutin, rifapentine, and griseofulvin, and possibly penicillins or tetracyclines  aprepitant  ascorbic  acid (vitamin C)  atorvastatin  barbiturate medicines, such as phenobarbital  bosentan  carbamazepine  caffeine  clofibrate  cyclosporine  dantrolene  doxercalciferol  felbamate  grapefruit juice  hydrocortisone  medicines for anxiety or sleeping problems, such as diazepam or temazepam  medicines for diabetes, including pioglitazone  mineral oil  modafinil  mycophenolate  nefazodone  oxcarbazepine  phenytoin  prednisolone  ritonavir or other medicines for HIV infection or AIDS  rosuvastatin  selegiline  soy isoflavones supplements  St. John's wort  tamoxifen or raloxifene  theophylline  thyroid hormones  topiramate  warfarin This list may not describe all possible interactions. Give your health care provider a list of all the medicines, herbs, non-prescription drugs, or dietary supplements you use. Also tell them if you smoke, drink alcohol, or use illegal drugs. Some items may interact with your medicine. What should I watch for while using this medicine? Visit your doctor or health care professional for regular checks on your progress. You will need a regular breast and pelvic exam  and Pap smear while on this medicine. Use an additional method of contraception during the first cycle that you take these tablets. If you have any reason to think you are pregnant, stop taking this medicine right away and contact your doctor or health care professional. If you are taking this medicine for hormone related problems, it may take several cycles of use to see improvement in your condition. Smoking increases the risk of getting a blood clot or having a stroke while you are taking birth control pills, especially if you are more than 43 years old. You are strongly advised not to smoke. This medicine can make your body retain fluid, making your fingers, hands, or ankles swell. Your blood pressure can go up. Contact your doctor or health care professional if you feel you are retaining fluid. This medicine can make you more sensitive to the sun. Keep out of the sun. If you cannot avoid being in the sun, wear protective clothing and use sunscreen. Do not use sun lamps or tanning beds/booths. If you wear contact lenses and notice visual changes, or if the lenses begin to feel uncomfortable, consult your eye care specialist. In some women, tenderness, swelling, or minor bleeding of the gums may occur. Notify your dentist if this happens. Brushing and flossing your teeth regularly may help limit this. See your dentist regularly and inform your dentist of the medicines you are taking. If you are going to have elective surgery, you may need to stop taking this medicine before the surgery. Consult your health care professional for advice. This medicine does not protect you against HIV infection (AIDS) or any other sexually transmitted diseases. What side effects may I notice from receiving this medicine? Side effects that you should report to your doctor or health care professional as soon as possible:  allergic reactions like skin rash, itching or hives, swelling of the face, lips, or tongue  breast  tissue changes or discharge  changes in vaginal bleeding during your period or between your periods  changes in vision  chest pain  confusion  coughing up blood  dizziness  feeling faint or lightheaded  headaches or migraines  leg, arm or groin pain  loss of balance or coordination  severe or sudden headaches  stomach pain (severe)  sudden shortness of breath  sudden numbness or weakness of the face, arm or leg  symptoms of vaginal infection like itching, irritation or unusual discharge  tenderness in the  upper abdomen  trouble speaking or understanding  vomiting  yellowing of the eyes or skin Side effects that usually do not require medical attention (report to your doctor or health care professional if they continue or are bothersome):  breakthrough bleeding and spotting that continues beyond the 3 initial cycles of pills  breast tenderness  mood changes, anxiety, depression, frustration, anger, or emotional outbursts  increased sensitivity to sun or ultraviolet light  nausea  skin rash, acne, or brown spots on the skin  weight gain (slight) This list may not describe all possible side effects. Call your doctor for medical advice about side effects. You may report side effects to FDA at 1-800-FDA-1088. Where should I keep my medicine? Keep out of the reach of children. Store at room temperature between 15 and 30 degrees C (59 and 86 degrees F). Throw away any unused medicine after the expiration date. NOTE: This sheet is a summary. It may not cover all possible information. If you have questions about this medicine, talk to your doctor, pharmacist, or health care provider.  2020 Elsevier/Gold Standard (2015-09-09 08:04:41)

## 2019-09-14 NOTE — Patient Instructions (Addendum)
Recommend daily broad spectrum sunscreen SPF 30+ to sun-exposed areas, reapply every 2 hours as needed. Call for new or changing lesions.   Recommend minoxidil 5 % solution daily to scalp

## 2019-09-14 NOTE — Progress Notes (Signed)
GYN ENCOUNTER NOTE  Subjective:       Yesenia Davis is a 44 y.o. [phone] female here for ultrasound and results review.   Patient last seen in office on 08/04/2019; please see previous note for further details.   Denies difficulty breathing or respiratory distress, chest pain, abdominal pain, dysuria, and leg pain or swelling.    Gynecologic History  Patient's last menstrual period was 09/09/2019 (exact date).  Contraception: none  Last Pap: 01/2018. Results were: Neg/Neg  Last mammogram: 03/2019. Results were: normal  Obstetric History  OB History  Gravida Para Term Preterm AB Living  4 3 3   1 3   SAB TAB Ectopic Multiple Live Births  1     0 3    # Outcome Date GA Lbr Len/2nd Weight Sex Delivery Anes PTL Lv  4 Term 10/18/15 [redacted]w[redacted]d / 00:06 6 lb 14.1 oz (3.12 kg) M Vag-Spont None  LIV  3 Term 2005   7 lb 1.8 oz (3.225 kg) M Vag-Spont   LIV  2 SAB 2003          1 Term 1996   7 lb 3.2 oz (3.266 kg) F Vag-Spont   LIV    Past Medical History:  Diagnosis Date  . Acne vulgaris   . Epigastric pain   . Gastritis   . GERD (gastroesophageal reflux disease)   . Gestational diabetes   . Migraines     Past Surgical History:  Procedure Laterality Date  . CHOLECYSTECTOMY  1996  . cyst removed     from neck- b9  . DILATION AND CURETTAGE OF UTERUS      Current Outpatient Medications on File Prior to Visit  Medication Sig Dispense Refill  . acetaminophen (TYLENOL) 500 MG chewable tablet Chew 500 mg by mouth every 6 (six) hours as needed for pain.    . [employer] ACID CONTROLLER 10 MG tablet Take 10 mg by mouth 2 (two) times daily.    2004 docusate calcium (SURFAK) 240 MG capsule Take 240 mg by mouth daily as needed.    . Prenatal-DSS-FeCb-FeGl-FA (CITRANATAL BLOOM) 90-1 MG TABS Take 1 tablet by mouth daily. 30 tablet 11  . VITAMIN D-1000 MAX ST 25 MCG (1000 UT) tablet Take 1,000 Units by mouth daily.     No current facility-administered medications on file prior to visit.     Allergies  Allergen Reactions  . Ciprofloxacin Hcl Rash    Social History   Socioeconomic History  . Marital status: Single    Spouse name: Not on file  . Number of children: Not on file  . Years of education: Not on file  . Highest education level: Not on file  Occupational History  . Not on file  Tobacco Use  . Smoking status: Never Smoker  . Smokeless tobacco: Never Used  Vaping Use  . Vaping Use: Never used  Substance and Sexual Activity  . Alcohol use: No  . Drug use: No  . Sexual activity: Yes    Birth control/protection: None  Other Topics Concern  . Not on file  Social History Narrative  . Not on file   Social Determinants of Health   Financial Resource Strain:   . Difficulty of Paying Living Expenses: Not on file  Food Insecurity:   . Worried About Marland Kitchen in the Last Year: Not on file  . Ran Out of Food in the Last Year: Not on file  Transportation Needs:   . Lack of Transportation (  Medical): Not on file  . Lack of Transportation (Non-Medical): Not on file  Physical Activity:   . Days of Exercise per Week: Not on file  . Minutes of Exercise per Session: Not on file  Stress:   . Feeling of Stress : Not on file  Social Connections:   . Frequency of Communication with Friends and Family: Not on file  . Frequency of Social Gatherings with Friends and Family: Not on file  . Attends Religious Services: Not on file  . Active Member of Clubs or Organizations: Not on file  . Attends Banker Meetings: Not on file  . Marital Status: Not on file  Intimate Partner Violence:   . Fear of Current or Ex-Partner: Not on file  . Emotionally Abused: Not on file  . Physically Abused: Not on file  . Sexually Abused: Not on file    Family History  Problem Relation Age of Onset  . Breast cancer Maternal Aunt        40's  . Diabetes Father   . Diabetes Mother   . Ovarian cancer Neg Hx   . Colon cancer Neg Hx   . Heart disease Neg Hx      The following portions of the patient's history were reviewed and updated as appropriate: allergies, current medications, past family history, past medical history, past social history, past surgical history and problem list.  Review of Systems  ROS negative except as note above. Information obtained from patient.   Objective:   BP 100/69   Pulse 63   Ht 5\' 3"  (1.6 m)   Wt 146 lb 8 oz (66.5 kg)   LMP 09/09/2019 (Exact Date)   BMI 25.95 kg/m   CONSTITUTIONAL: Well-developed, well-nourished female in no acute distress.   PHYSICAL EXAM: Not indicated.   Recent Results (from the past 2160 hour(s))  Beta hCG quant (ref lab)     Status: None   Collection Time: 08/04/19  4:19 PM  Result Value Ref Range   hCG Quant <1 mIU/mL    Comment:                      Female (Non-pregnant)    0 -     5                             (Postmenopausal)  0 -     8                      Female (Pregnant)                      Weeks of Gestation                              3                6 -    71                              4               10 -   750  5              217 -  7138                              6              158 - 31795                              7             [id number] -[id number]                              8            32065 -149571                              9            63803 -[phone]                             10            46509 -[phone]                             12            27832 -[phone]                             14            13950 - 62530                             15            12039 - 70971                             16             9040 - 56451                             17             8175 - [phone]                             18             8099 - 58176 Roche E CLIA methodology   CBC     Status: None   Collection Time: 08/04/19  4:19 PM  Result Value Ref Range   WBC 9.0 3.4 - 10.8 x10E3/uL   RBC 4.73 3.77 - 5.28 x10E6/uL    Hemoglobin 13.2 11.1 - 15.9 g/dL   Hematocrit 82.4 23.5 - 46.6 %   MCV 89 79 - 97 fL   MCH 27.9 26.6 - 33.0 pg   MCHC 31.5 31 - 35 g/dL   RDW 36.1 44.3 - 15.4 %   Platelets 327 150 - 450 x10E3/uL  POCT urine pregnancy  Status: None   Collection Time: 08/04/19  4:33 PM  Result Value Ref Range   Preg Test, Ur Negative Negative   ULTRASOUND REPORT  Location: [facility]  Date of Service: 09/14/2019    TECHNIQUE: Both transabdominal and transvaginal ultrasound examinations of the pelvis were performed. Transabdominal technique was performed for global imaging of the pelvis including uterus, ovaries, adnexal regions, and pelvic cul-de-sac. It was necessary to proceed with endovaginal exam following the transabdominal exam to visualize the endometrium and adnexa. Transabdominal technique was performed for global imaging of the pelvis including uterus, ovaries, adnexal regions, and pelvic cul-de-sac.  Indications:Pelvic Pain   Findings:  The uterus is retroverted and measures 8.5 x 4.1 x 5.7 cm. Echo texture is homogenous without evidence of focal masses.  The Endometrium measures 5 mm.  Right Ovary measures 3.2 x 1.9 x 1.9 cm. It is normal in appearance. Peripheral follicular cysts measuring from 3-8 mm. Left Ovary measures 2.5 x 1.5 x 14 cm. It is normal in appearance.Peripheral follicular cysts measuring from 3-8 mm. Larger simple cyst that is pedunculated of the left ovary measuring 1.5 x 1.1 x 1.1 Survey of the adnexa demonstrates no adnexal masses. There is no free fluid in the cul de sac.  Impression: 1.Bilateral cystic ovaries as described above.  Recommendations: 1.Clinical correlation with the patient's History and Physical Exam.    Assessment:   1. Pelvic pain   2. Infertility, female   Plan:   Results discussed in detail, patient verbalized understanding.   Samples of Lo Loestrin given.   Reviewed red flag symptoms and when to call.   RTC x  4 months for ANNUAL EXAM or sooner if needed.    Serafina RoyalsMichelle Maiah Sinning, CNM [facility], [facility]

## 2019-09-14 NOTE — Progress Notes (Signed)
   New Patient Visit  Subjective  Yesenia Davis is a 44 y.o. female who presents for the following: New Patient (Initial Visit).  Patient presents today as a new patient to re-establish care. Patient is having some issues with itch in her vaginal area and on her legs for over a year. PCP gave prescription but did not help just made it more irritated.   She reports hair loss increased lately as well.   The following portions of the chart were reviewed this encounter and updated as appropriate:  Tobacco  Allergies  Meds  Problems  Med Hx  Surg Hx  Fam Hx      Review of Systems:  No other skin or systemic complaints except as noted in HPI or Assessment and Plan.  Objective  Well appearing patient in no apparent distress; mood and affect are within normal limits.  A focused examination was performed including scalp, ears, face, lips, eyelids, abdomen, bilateral arms, hands, nails, bilateral legs, groin. Relevant physical exam findings are noted in the Assessment and Plan.  Objective  Pubic: Lichenification of vulvar skin  Objective  Right Lower Leg - Anterior: Dry skin  Objective  Mid Back: Follicular-based erythematous papules and pustules.   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 Your physician has selected the right strength medicine for your problem and area affected on the body. Please use your medication only as directed by your  physician to prevent side effects.      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  ILeward Quan, CMA, am acting as scribe for Darden Dates, MD .  Documentation: I have reviewed the above documentation for accuracy and completeness, and I agree with the above.  Darden Dates, MD

## 2019-09-20 LAB — COMPREHENSIVE METABOLIC PANEL
ALT: 27 IU/L (ref 0–32)
AST: 18 IU/L (ref 0–40)
Albumin/Globulin Ratio: 1.6 (ref 1.2–2.2)
Albumin: 4.5 g/dL (ref 3.8–4.8)
Alkaline Phosphatase: 82 IU/L (ref 48–121)
BUN/Creatinine Ratio: 24 — ABNORMAL HIGH (ref 9–23)
BUN: 17 mg/dL (ref 6–24)
Bilirubin Total: 0.2 mg/dL (ref 0.0–1.2)
CO2: 26 mmol/L (ref 20–29)
Calcium: 9.8 mg/dL (ref 8.7–10.2)
Chloride: 101 mmol/L (ref 96–106)
Creatinine, Ser: 0.72 mg/dL (ref 0.57–1.00)
GFR calc Af Amer: 119 mL/min/{1.73_m2} (ref >59)
GFR calc non Af Amer: 103 mL/min/{1.73_m2} (ref >59)
Globulin, Total: 2.8 g/dL (ref 1.5–4.5)
Glucose: 100 mg/dL — ABNORMAL HIGH (ref 65–99)
Potassium: 4 mmol/L (ref 3.5–5.2)
Sodium: 139 mmol/L (ref 134–144)
Total Protein: 7.3 g/dL (ref 6.0–8.5)

## 2019-09-20 LAB — CBC WITH DIFFERENTIAL/PLATELET
Basophils Absolute: 0.1 10*3/uL (ref 0.0–0.2)
Basos: 1 %
EOS (ABSOLUTE): 0.4 10*3/uL (ref 0.0–0.4)
Eos: 5 %
Hematocrit: 39.9 % (ref 34.0–46.6)
Hemoglobin: 13.2 g/dL (ref 11.1–15.9)
Immature Grans (Abs): 0 10*3/uL (ref 0.0–0.1)
Immature Granulocytes: 0 %
Lymphocytes Absolute: 2.5 10*3/uL (ref 0.7–3.1)
Lymphs: 30 %
MCH: 29.3 pg (ref 26.6–33.0)
MCHC: 33.1 g/dL (ref 31.5–35.7)
MCV: 89 fL (ref 79–97)
Monocytes Absolute: 0.7 10*3/uL (ref 0.1–0.9)
Monocytes: 9 %
Neutrophils Absolute: 4.6 10*3/uL (ref 1.4–7.0)
Neutrophils: 55 %
Platelets: 349 10*3/uL (ref 150–450)
RBC: 4.5 x10E6/uL (ref 3.77–5.28)
RDW: 12.2 % (ref 11.7–15.4)
WBC: 8.3 10*3/uL (ref 3.4–10.8)

## 2019-09-20 LAB — VITAMIN D 25 HYDROXY (VIT D DEFICIENCY, FRACTURES): Vit D, 25-Hydroxy: 24.5 ng/mL — ABNORMAL LOW (ref 30.0–100.0)

## 2019-09-20 LAB — TSH: TSH: 1.35 u[IU]/mL (ref 0.450–4.500)

## 2019-09-20 LAB — FERRITIN: Ferritin: 38 ng/mL (ref 15–150)

## 2019-09-20 LAB — CARBON MONOXIDE, BLOOD (PERFORMED AT REF LAB): CARBON MONOXIDE, BLOOD: 2 % (ref 0.0–3.6)

## 2019-09-21 NOTE — Progress Notes (Signed)
Blood counts and chemistry panel are unremarkable. Iron stores are adequate. No carbon monoxide poisoning.  Vitamin D is low --> recommend supplementing with 1000 IU of vitamin D daily and repeating test in 3 months.

## 2019-09-26 ENCOUNTER — Encounter: Payer: Self-pay | Admitting: Dermatology

## 2019-10-03 ENCOUNTER — Other Ambulatory Visit: Payer: Self-pay | Admitting: Certified Nurse Midwife

## 2019-10-03 DIAGNOSIS — R102 Pelvic and perineal pain: Secondary | ICD-10-CM

## 2019-10-03 NOTE — Progress Notes (Signed)
Referral to Pelvic Floor Physical Therapy, see orders.    Yesenia Davis, CNM [facility], [facility] 10/03/19 11:24 PM

## 2019-10-09 ENCOUNTER — Telehealth: Payer: Self-pay

## 2019-10-09 NOTE — Telephone Encounter (Signed)
Pt called in and stated that she is requesting a call back from the nurse. The pt was put on [facility] and she doesn't think it is helping the pt stated that she took the pack and she is bleeding for 9 days. The pt was wanting to be seen today. I let her know her provider is out of the office and that I will send a message to the nurse. The pt verbally understood. Please advise

## 2019-10-10 ENCOUNTER — Telehealth: Payer: Self-pay

## 2019-10-10 NOTE — Telephone Encounter (Signed)
Called pt. Informed her that Yesenia Davis, CNM placed a referral to Pelvic Floor Physical Therapy at [facility] on 10/03/19.Provided her with their number. Informed her to call and schedule an appointment. Informed her it may take weeks to get an appointment per [facility]. Pt verbalized understanding. Pt to correspond via mychart.

## 2019-10-10 NOTE — Telephone Encounter (Signed)
No answer/voicemail not set up.

## 2019-10-16 ENCOUNTER — Ambulatory Visit: Payer: BC Managed Care – PPO | Attending: Certified Nurse Midwife | Admitting: Physical Therapy

## 2019-10-16 ENCOUNTER — Encounter: Payer: Self-pay | Admitting: Physical Therapy

## 2019-10-16 ENCOUNTER — Telehealth: Payer: Self-pay

## 2019-10-16 ENCOUNTER — Other Ambulatory Visit: Payer: Self-pay

## 2019-10-16 ENCOUNTER — Telehealth: Payer: Self-pay | Admitting: Certified Nurse Midwife

## 2019-10-16 DIAGNOSIS — M533 Sacrococcygeal disorders, not elsewhere classified: Secondary | ICD-10-CM | POA: Insufficient documentation

## 2019-10-16 DIAGNOSIS — M6281 Muscle weakness (generalized): Secondary | ICD-10-CM | POA: Diagnosis present

## 2019-10-16 NOTE — Telephone Encounter (Signed)
Telephone call received from patient while at PT assessment.   Reports abdominal pain, dizziness, nausea and longer periods since starting OCP.   Concerned about restarting pill.   Reviewed red flag symptoms and when to call.   Scheduled for appointment on Friday at 10 am.    Serafina Royals, CNM [facility], [facility] 10/16/19 4:55 PM

## 2019-10-16 NOTE — Telephone Encounter (Signed)
Called patient per Serafina Royals, CNM. Pt did not answer. Voicemail box is not set up.

## 2019-10-16 NOTE — Patient Instructions (Signed)
  Clam Shell 45 Degrees  Lying with hips and knees bent 45, one pillow between knees and ankles. Heel together, toes apart like ballerina,  Lift knee with exhale while pressing heels together. Be sure pelvis does not roll backward. Do not arch back. Do 20 times, each leg, 2 times per day.     Complimentary stretch: Figure-4  Cross _ foot over _ thigh, opposite knee straight  3 breaths  * Keep pelvis levelled with tactile cue with hand under back of hips  * Slide the ankle of the supporting foot out to decrease the angle which can help level the pelvis  

## 2019-10-17 NOTE — Therapy (Addendum)
[facility] MAIN [facility] SERVICES [address] [place], [place], [address] Phone: [phone]   Fax:  [phone]  Physical Therapy Evaluation  Patient Details  Name: Yesenia Davis MRN: [id number] Date of Birth: 11/27/1975 Referring Provider (PT): Serafina Royals    Encounter Date: 10/16/2019   PT End of Session - 10/16/19 1635    Visit Number 1    Number of Visits 10    Date for PT Re-Evaluation 12/25/19    PT Start Time 1600    PT Stop Time 1700    PT Time Calculation (min) 60 min           Past Medical History:  Diagnosis Date  . Acne vulgaris   . Epigastric pain   . Gastritis   . GERD (gastroesophageal reflux disease)   . Gestational diabetes   . Migraines     Past Surgical History:  Procedure Laterality Date  . CHOLECYSTECTOMY  1996  . cyst removed     from neck- b9  . DILATION AND CURETTAGE OF UTERUS      There were no vitals filed for this visit.    Subjective Assessment - 10/16/19 1609    Subjective 1) R and L ovary pain started 3 years ago after she had her 3rd child.   Pt is taking medication that decreased the pain but caused her period lasting longer ( 12 days instead of 7 days)  and she got dizzy and nausea. Pt felt her dizziness did not feel like vertigo which she has had in the past. Pt noticed blood clots at the end of her longer periods since taking the new birth control medication.   2) upper abdominal pain with gastritis started 10 years ago.   3) prolapse:   L& D related her 3rd child : pre-diabetes and bleeding with vaginal [place] was neg. Her midwife at the time told her that her uterus was low.  Pt has felt a lowered feeling in the vagina and this area hurts her when she has sex.   4) Constipation: bowel movements occur with laxative, 2-3 x week , without laxative occur 1 x week. Strain with bowel movements. Pain also ocurs in the ovary area when constipated. Daily fluid intake:  10-12 fl oz of water, denied  teas, coffee. Soda once a week. Stool Type 4 Bristol stool type most of the time    5) SUI    6) CLBP since 44 years old. Pt had back injury  after sliding down onto concrete. Pt points to sacrum. Pt is not able to sit on hard chair or sit on the floor for 15 min and not able to get up easily.      Patient Stated Goals get abdominal / ovary pain down, play with son,              [facility] PT Assessment - 10/17/19 1302      Assessment   Medical Diagnosis pelvic pain    Referring Provider (PT) Serafina Royals       Precautions   Precautions None      Restrictions   Weight Bearing Restrictions No      Balance Screen   Has the patient fallen in the past 6 months Yes    How many times? 1    Has the patient had a decrease in activity level because of a fear of falling?  No      Observation/Other Assessments   Observations ankles crossed  Scoliosis R thoracic convex, L iliac crest higher  ( post Tx: less convex curve and levelled iliac crest )        Coordination   Gross Motor Movements are Fluid and Coordinated --   diaphragmatic excursion present     Strength   Overall Strength Comments BLE 4/5 , hip ab 3/5 B                       Objective measurements completed on examination: See above findings.     Pelvic Floor Special Questions - 10/16/19 1706    Diastasis Recti 3 fingers above umbilicus     External Perineal Exam no tenderness, tensions noted            [facility] Adult PT Treatment/Exercise - 10/17/19 1301      Therapeutic Activites    Other Therapeutic Activities facilitated communicate with referring provider re: side effects to her new medications         Manual Therapy   Manual therapy comments MWM/STM at thoracic                       PT Long Term Goals - 10/16/19 1628      PT LONG TERM GOAL #1   Title Pt will be able to sit on hard chair or sit on the floor for > 15 min and be able to get up easily to play with her son     Time 6    Period Weeks    Status New    Target Date 11/27/19      PT LONG TERM GOAL #2   Title Pt will report improved FOTO score for pelvic pain from 50pts by 10 pts change in order to improve QOL    Time 10    Period Weeks    Status New    Target Date 12/25/19      PT LONG TERM GOAL #3   Title Pt will demo increased hip abduction from 3/5 B to > 4/5 in order improve pelvic girdle stability for functional mobility    Time 8    Period Weeks    Status New    Target Date 12/12/19      PT LONG TERM GOAL #4   Title Pt will be IND with deep core coordination and exercises and pelvic floor mm to improve IAP system and to improve prolapse    Time 4    Period Weeks    Status New    Target Date 11/14/19                  Plan - 10/17/19 1252    Clinical Impression Statement  Pt is a 44 yo  who presents with R and L ovary pain, upper abdominal pain, constipation, SUI, and CLBP, and prolapse. These Sx impact her ADLs and family life. Pt's musculoskeletal assessment revealed deviation to her spine, unequal iliac crest height, diastasis recti,  weak hip weakness, poor body mechanics which places strain on the abdominal/pelvic mm.   These are deficits that indicate an ineffective intraabdominal pressure system associated with increased risk for pt's Sx.   Pt was provided education on etiology of Sx with anatomy, physiology explanation with images along with the benefits of customized pelvic PT Tx based on pt's medical conditions and musculoskeletal deficits.  Explained the physiology of deep core mm coordination and roles of pelvic floor function in urination, defecation, sexual function,  and postural control with deep core mm system.   Following Tx today which pt tolerated without complaints, pt demo'd equal alignment of pelvic girdle and provided hip strengthening exercises to promote more pelvic girdle stability. Plan to advance pt to deep core strengthening and address DRA to improve  IAP system to improve low back pain and abdominal region.   Interdiscciplinary update:  Pt's referring provider who prescribed her new birth control medication was called and she spoke directly to pt re: discontinuing the medications because of her side effects (i.e. her period lasting longer 12 days instead of 7 days, dizziness  and nausea).    Pt communicated to provider  her concern of noticing a pulse in both  R upper / lower quadrant. An appt was made for pt to provider this Friday to discuss these concerns.      Examination-Activity Limitations Continence    Clinical Decision Making Moderate    Rehab Potential Good    PT Frequency 1x / week    PT Duration Other (comment)   10   PT Treatment/Interventions Neuromuscular re-education;Stair training;Therapeutic activities;Functional mobility training;Moist Heat;Manual techniques;Patient/family education;Taping;Scar mobilization;Balance training;Energy conservation;Gait training;Therapeutic exercise;Biofeedback;Cryotherapy    Consulted and Agree with Plan of Care Patient           Patient will benefit from skilled therapeutic intervention in order to improve the following deficits and impairments:  Improper body mechanics, Pain, Increased muscle spasms, Decreased mobility, Decreased endurance, Decreased range of motion, Decreased coordination, Decreased balance, Difficulty walking, Decreased safety awareness, Decreased strength, Hypomobility, Hypermobility, Decreased scar mobility  Visit Diagnosis: Sacrococcygeal disorders, not elsewhere classified  Muscle weakness (generalized)     Problem List Patient Active Problem List   Diagnosis Date Noted  . Postpartum depression 11/29/2015  . Gestational diabetes mellitus 10/18/2015    Mariane Masters  ,PT, DPT, E-RYT  10/17/2019, 1:38 PM  [facility] MAIN [facility] SERVICES [address] [place], [place], [address] Phone: [phone]   Fax:   [phone]  Name: Fern Canova MRN: [id number] Date of Birth: Jan 20, 1975

## 2019-10-17 NOTE — Therapy (Deleted)
[place] [facility] MAIN [facility] SERVICES [address] [place], [place], [address] Phone: [phone]   Fax:  [phone]  Physical Therapy Treatment  Patient Details  Name: Yesenia Davis MRN: [id number] Date of Birth: [birth year] Referring Provider (PT): Serafina Royals    Encounter Date: 10/16/2019   PT End of Session - 10/16/19 1635    Visit Number 1    Number of Visits 10    Date for PT Re-Evaluation 12/25/19    PT Start Time 1600    PT Stop Time 1700    PT Time Calculation (min) 60 min           Past Medical History:  Diagnosis Date  . Acne vulgaris   . Epigastric pain   . Gastritis   . GERD (gastroesophageal reflux disease)   . Gestational diabetes   . Migraines     Past Surgical History:  Procedure Laterality Date  . CHOLECYSTECTOMY  1996  . cyst removed     from neck- b9  . DILATION AND CURETTAGE OF UTERUS      There were no vitals filed for this visit.   Subjective Assessment - 10/16/19 1609    Subjective 1) R and L ovary pain started 3 years ago after she had her 3rd child.   Pt is taking medication that decreased the pain but caused her period lasting longer ( 12 days instead of 7 days)  and she got dizzy and nausea. Pt felt her dizziness did not feel like vertigo which she has had in the past. Pt noticed blood clots at the end of her longer periods since taking the new birth control medication. 2) prolapse:   L& D related her 3rd child : pre-diabetes and bleeding with vaginal [place] was neg. Her midwife at the time told her that her uterus was low.  Pt has felt a lowered feeling in the vagina and this area hurts her when she has sex. 2) Constipation: bowel movements occur with laxative, 2-3 x week , without laxative occur 1 x week. Strain with bowel movements. Pain also ocurs in the ovary area when constipated.Daily fluid intake:  10-12 fl oz of water, denied teas, coffee. Soda once a week. Stool Type 4 Bristol stool type most of the  time  4) upper abdominal pain with gastritis started 10 years ago. 5) SUI  6) CLBP since 44 years old. Pt had back injury  after sliding down onto concrete. Pt points to sacrum. Pt is not able to sit on hard chair or sit on the floor for 15 min and not able to get up easily.    Patient Stated Goals get abdominal / ovary pain down, play with son,              [facility] PT Assessment - 10/17/19 1302      Assessment   Medical Diagnosis pelvic pain    Referring Provider (PT) Serafina Royals       Precautions   Precautions None      Restrictions   Weight Bearing Restrictions No      Balance Screen   Has the patient fallen in the past 6 months Yes    How many times? 1    Has the patient had a decrease in activity level because of a fear of falling?  No      Observation/Other Assessments   Observations ankles crossed    Scoliosis R throacic convex, L iliac crest higher  ( post  Tx: less convex curve and levelled iliac crest )        Coordination   Gross Motor Movements are Fluid and Coordinated --   diaphragmatic excursion present     Strength   Overall Strength Comments BLE 4/5 , hip ab 3/5 B                       Pelvic Floor Special Questions - 10/16/19 1706    Diastasis Recti 3 fingers above umbilicus     External Perineal Exam no tenderness, tensions noted             [facility] Adult PT Treatment/Exercise - 10/17/19 1301      Therapeutic Activites    Other Therapeutic Activities facilitated communicate with referring provider re: side effects to her new medications         Manual Therapy   Manual therapy comments MWM/STM at thoracic                       PT Long Term Goals - 10/16/19 1628      PT LONG TERM GOAL #1   Title Pt will be able to sit on hard chair or sit on the floor for > 15 min and be able to get up easily to play with her son    Time 6    Period Weeks    Status New    Target Date 11/27/19      PT LONG TERM GOAL #2   Title  Pt will report improved FOTO score for pelvic pain from 50pts by 10 pts change in order to improve QOL    Time 10    Period Weeks    Status New    Target Date 12/25/19      PT LONG TERM GOAL #3   Title Pt will demo increased hip abduction from 3/5 B to > 4/5 in order improve pelvic girdle stability for functional mobility    Time 8    Period Weeks    Status New    Target Date 12/12/19      PT LONG TERM GOAL #4   Title Pt will be IND with deep core coordination and exercises and pelvic floor mm to improve IAP system and to improve prolapse    Time 4    Period Weeks    Status New    Target Date 11/14/19                 Plan - 10/17/19 1252    Clinical Impression Statement Pt    Examination-Activity Limitations Continence    Clinical Decision Making Moderate    Rehab Potential Good    PT Frequency 1x / week    PT Duration Other (comment)   10   PT Treatment/Interventions Neuromuscular re-education;Stair training;Therapeutic activities;Functional mobility training;Moist Heat;Manual techniques;Patient/family education;Taping;Scar mobilization;Balance training;Energy conservation;Gait training;Therapeutic exercise;Biofeedback;Cryotherapy    Consulted and Agree with Plan of Care Patient           Patient will benefit from skilled therapeutic intervention in order to improve the following deficits and impairments:  Improper body mechanics, Pain, Increased muscle spasms, Decreased mobility, Decreased endurance, Decreased range of motion, Decreased coordination, Decreased balance, Difficulty walking, Decreased safety awareness, Decreased strength, Hypomobility, Hypermobility, Decreased scar mobility  Visit Diagnosis: Sacrococcygeal disorders, not elsewhere classified  Muscle weakness (generalized)     Problem List Patient Active Problem List   Diagnosis Date Noted  . 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 Gestational diabetes mellitus 10/18/2015    Mariane Masters 10/17/2019, 1:38 PM  [place] [facility] MAIN [facility] SERVICES [address] [place], [place], [address] Phone: [phone]   Fax:  [phone]  Name: Yesenia Davis MRN: [id number] Date of Birth: 1975-10-25

## 2019-10-17 NOTE — Addendum Note (Signed)
Addended by: Mariane Masters on: 10/17/2019 03:02 PM   Modules accepted: Orders

## 2019-10-20 ENCOUNTER — Other Ambulatory Visit: Payer: Self-pay

## 2019-10-20 ENCOUNTER — Encounter: Payer: Self-pay | Admitting: Certified Nurse Midwife

## 2019-10-20 ENCOUNTER — Ambulatory Visit (INDEPENDENT_AMBULATORY_CARE_PROVIDER_SITE_OTHER): Payer: BC Managed Care – PPO | Admitting: Certified Nurse Midwife

## 2019-10-20 VITALS — BP 112/69 | HR 62 | Ht 63.0 in | Wt 144.2 lb

## 2019-10-20 DIAGNOSIS — E282 Polycystic ovarian syndrome: Secondary | ICD-10-CM | POA: Diagnosis not present

## 2019-10-20 DIAGNOSIS — N921 Excessive and frequent menstruation with irregular cycle: Secondary | ICD-10-CM

## 2019-10-20 DIAGNOSIS — R102 Pelvic and perineal pain: Secondary | ICD-10-CM | POA: Diagnosis not present

## 2019-10-20 NOTE — Patient Instructions (Signed)
Ethinyl Estradiol; Levonorgestrel; Ferrous bisglycinate oral tablets What is this medicine? ETHINYL ESTRADIOL; LEVONORGESTREL; FERROUS BISGLYCINATE (ETH in il es tra DYE ole; LEE voh nor jes trel; FER [place] bis GLY sin ate) is an oral contraceptive (birth control pill). This product combines two types of female hormones, an estrogen and a progestin. It is used to prevent ovulation and pregnancy. This medicine may be used for other purposes; ask your health care provider or pharmacist if you have questions. COMMON BRAND NAME(S): Balcoltra What should I tell my health care provider before I take this medicine? They need to know if you have any of these conditions:  abnormal vaginal bleeding  blood vessel disease  breast, cervical, endometrial, ovarian, liver, or uterine cancer  diabetes  gallbladder disease  heart disease or recent heart attack  high blood pressure  high cholesterol  history of blood clots  kidney disease  liver disease  migraine headaches  smoke tobacco  stroke  systemic lupus erythematosus (SLE)  an unusual or allergic reaction to estrogens, progestins, other medicines, foods, dyes, or preservatives  pregnant or trying to get pregnant  breast-feeding How should I use this medicine? Take this medicine by mouth. To reduce nausea, this medicine may be taken with food. Follow the directions on the prescription label. Take this medicine at the same time each day and in the order directed on the package. Do not take your medicine more often than directed. A patient package insert for the product will be given with each prescription and refill. Read this sheet carefully each time. The sheet may change frequently. Contact your pediatrician regarding the use of this medicine in children. Special care may be needed. This medicine has been used in female children who have started having menstrual periods. Overdosage: If you think you have taken too much of this  medicine contact a poison control center or emergency room at once. NOTE: This medicine is only for you. Do not share this medicine with others. What if I miss a dose? If you miss a dose, refer to the patient information sheet you received with your medicine for direction. If you miss more than one pill, this medicine may not be as effective and you may need to use another form of birth control. What may interact with this medicine? Do not take this medicine with the following medication:  dasabuvir; ombitasvir; paritaprevir; ritonavir  ombitasvir; paritaprevir; ritonavir This medicine may also interact with the following medications:  acetaminophen  antibiotics or medicines for infections, especially rifampin, rifabutin, rifapentine, and griseofulvin, and possibly penicillins or tetracyclines  aprepitant  ascorbic acid (vitamin C)  atorvastatin  barbiturate medicines, such as phenobarbital  bosentan  caffeine  carbamazepine  clofibrate  cyclosporine  dantrolene  doxercalciferol  felbamate  grapefruit juice  hydrocortisone  medicines for anxiety or sleeping problems, such as diazepam or temazepam  medicines for diabetes, including pioglitazone  mineral oil  modafinil  mycophenolate  nefazodone  oxcarbazepine  phenytoin  prednisolone  ritonavir or other medicines for HIV infection or AIDS  rosuvastatin  selegiline  soy isoflavones supplements  St. John's wort  tamoxifen or raloxifene  theophylline  thyroid hormones  topiramate  warfarin This list may not describe all possible interactions. Give your health care provider a list of all the medicines, herbs, non-prescription drugs, or dietary supplements you use. Also tell them if you smoke, drink alcohol, or use illegal drugs. Some items may interact with your medicine. What should I watch for while using this medicine?  Visit your doctor or health care professional for regular checks on  your progress. You will need a regular breast and pelvic exam and Pap smear while on this medicine. Use an additional method of contraception during the first cycle that you take these tablets. If you have any reason to think you are pregnant, stop taking this medicine right away and contact your doctor or health care professional. If you are taking this medicine for hormone related problems, it may take several cycles of use to see improvement in your condition. Smoking increases the risk of getting a blood clot or having a stroke while you are taking birth control pills, especially if you are more than 43 years old. You are strongly advised not to smoke. This medicine can make your body retain fluid, making your fingers, hands, or ankles swell. Your blood pressure can go up. Contact your doctor or health care professional if you feel you are retaining fluid. This medicine can make you more sensitive to the sun. Keep out of the sun. If you cannot avoid being in the sun, wear protective clothing and use sunscreen. Do not use sun lamps or tanning beds/booths. If you wear contact lenses and notice visual changes, or if the lenses begin to feel uncomfortable, consult your eye care specialist. In some women, tenderness, swelling, or minor bleeding of the gums may occur. Notify your dentist if this happens. Brushing and flossing your teeth regularly may help limit this. See your dentist regularly and inform your dentist of the medicines you are taking. If you are going to have elective surgery, you may need to stop taking this medicine before the surgery. Consult your health care professional for advice. This medicine does not protect you against HIV infection (AIDS) or any other sexually transmitted diseases. What side effects may I notice from receiving this medicine? Side effects that you should report to your doctor or health care professional as soon as possible:  allergic reactions like skin rash,  itching or hives, swelling of the face, lips, or tongue  breast tissue changes or discharge  changes in vaginal bleeding during your period or between your periods  changes in vision  chest pain  confusion  coughing up blood  dizziness  feeling faint or lightheaded  headaches or migraines  leg, arm or groin pain  loss of balance or coordination  severe or sudden headaches  stomach pain (severe)  sudden shortness of breath  sudden numbness or weakness of the face, arm or leg  symptoms of vaginal infection like itching, irritation or unusual discharge  tenderness in the upper abdomen  trouble speaking or understanding  vomiting  yellowing of the eyes or skin Side effects that usually do not require medical attention (report these to your doctor or health care professional if they continue or are bothersome):  breakthrough bleeding and spotting that continues beyond the 3 initial cycles of pills  breast tenderness  mood changes, anxiety, depression, frustration, anger, or emotional outbursts  increased sensitivity to sun or ultraviolet light  nausea  skin rash, acne, or brown spots on the skin  weight gain (slight) This list may not describe all possible side effects. Call your doctor for medical advice about side effects. You may report side effects to FDA at 1-800-FDA-1088. Where should I keep my medicine? Keep out of the reach of children. Store at room temperature between 15 and 30 degrees C (59 and 86 degrees F). Throw away any unused medicine after the expiration date.  NOTE: This sheet is a summary. It may not cover all possible information. If you have questions about this medicine, talk to your doctor, pharmacist, or health care provider.  2020 Elsevier/Gold Standard (2016-02-03 15:12:39)   Polycystic Ovarian Syndrome  Polycystic ovarian syndrome (PCOS) is a common hormonal disorder among women of reproductive age. In most women with PCOS, many  small fluid-filled sacs (cysts) grow on the ovaries, and the cysts are not part of a normal menstrual cycle. PCOS can cause problems with your menstrual periods and make it difficult to get pregnant. It can also cause an increased risk of miscarriage with pregnancy. If it is not treated, PCOS can lead to serious health problems, such as diabetes and heart disease. What are the causes? The cause of PCOS is not known, but it may be the result of a combination of certain factors, such as:  Irregular menstrual cycle.  High levels of certain hormones (androgens).  Problems with the hormone that helps to control blood sugar (insulin resistance).  Certain genes. What increases the risk? This condition is more likely to develop in women who have a family history of PCOS. What are the signs or symptoms? Symptoms of PCOS may include:  Multiple ovarian cysts.  Infrequent periods or no periods.  Periods that are too frequent or too heavy.  Unpredictable periods.  Inability to get pregnant (infertility) because of not ovulating.  Increased growth of hair on the face, chest, stomach, back, thumbs, thighs, or toes.  Acne or oily skin. Acne may develop during adulthood, and it may not respond to treatment.  Pelvic pain.  Weight gain or obesity.  Patches of thickened and dark brown or black skin on the neck, arms, breasts, or thighs (acanthosis nigricans).  Excess hair growth on the face, chest, abdomen, or upper thighs (hirsutism). How is this diagnosed? This condition is diagnosed based on:  Your medical history.  A physical exam, including a pelvic exam. Your health care provider may look for areas of increased hair growth on your skin.  Tests, such as: ? Ultrasound. This may be used to examine the ovaries and the lining of the uterus (endometrium) for cysts. ? Blood tests. These may be used to check levels of sugar (glucose), female hormone (testosterone), and female hormones (estrogen  and progesterone) in your blood. How is this treated? There is no cure for PCOS, but treatment can help to manage symptoms and prevent more health problems from developing. Treatment varies depending on:  Your symptoms.  Whether you want to have a baby or whether you need birth control (contraception). Treatment may include nutrition and lifestyle changes along with:  Progesterone hormone to start a menstrual period.  Birth control pills to help you have regular menstrual periods.  Medicines to make you ovulate, if you want to get pregnant.  Medicine to reduce excessive hair growth.  Surgery, in severe cases. This may involve making small holes in one or both of your ovaries. This decreases the amount of testosterone that your body produces. Follow these instructions at home:  Take over-the-counter and prescription medicines only as told by your health care provider.  Follow a healthy meal plan. This can help you reduce the effects of PCOS. ? Eat a healthy diet that includes lean proteins, complex carbohydrates, fresh fruits and vegetables, low-fat dairy products, and healthy fats. Make sure to eat enough fiber.  If you are overweight, lose weight as told by your health care provider. ? Losing 10% of your body  weight may improve symptoms. ? Your health care provider can determine how much weight loss is best for you and can help you lose weight safely.  Keep all follow-up visits as told by your health care provider. This is important. Contact a health care provider if:  Your symptoms do not get better with medicine.  You develop new symptoms. This information is not intended to replace advice given to you by your health care provider. Make sure you discuss any questions you have with your health care provider. Document Revised: 12/11/2016 Document Reviewed: 06/16/2015 Elsevier Patient Education  2020 Elsevier Inc.   Diet for Polycystic Ovary Syndrome Polycystic ovary  syndrome (PCOS) is a disorder of the chemicals (hormones) that regulate a woman's reproductive system, including monthly periods (menstruation). The condition causes important hormones to be out of balance. PCOS can:  Stop your periods or make them irregular.  Cause cysts to develop on your ovaries.  Make it difficult to get pregnant.  Stop your body from responding to the effects of insulin (insulin resistance). Insulin resistance can lead to obesity and diabetes. Changing what you eat can help you manage PCOS and improve your health. Following a balanced diet can help you lose weight and improve the way that your body uses insulin. What are tips for following this plan?  Follow a balanced diet for meals and snacks. Eat breakfast, lunch, dinner, and one or two snacks every day.  Include protein in each meal and snack.  Choose whole grains instead of products that are made with refined flour.  Eat a variety of foods.  Exercise regularly as told by your health care provider. Aim to do 30 or more minutes of exercise on most days of the week.  If you are overweight or obese: ? Pay attention to how many calories you eat. Cutting down on calories can help you lose weight. ? Work with your health care provider or a diet and nutrition specialist (dietitian) to figure out how many calories you need each day. What foods can I eat?  Fruits Include a variety of colors and types. All fruits are helpful for PCOS. Vegetables Include a variety of colors and types. All vegetables are helpful for PCOS. Grains Whole grains, such as whole wheat. Whole-grain breads, crackers, cereals, and pasta. Unsweetened oatmeal, bulgur, barley, quinoa, and brown rice. Tortillas made from corn or whole-wheat flour. Meats and other proteins Low-fat (lean) proteins, such as fish, chicken, beans, eggs, and tofu. Dairy Low-fat dairy products, such as skim milk, cheese sticks, and yogurt. Beverages Low-fat or  fat-free drinks, such as water, low-fat milk, sugar-free drinks, and small amounts of 100% fruit juice. Seasonings and condiments Ketchup. Mustard. Barbecue sauce. Relish. Low-fat or fat-free mayonnaise. Fats and oils Olive oil or canola oil. Walnuts and almonds. The items listed above may not be a complete list of recommended foods and beverages. Contact a dietitian for more options. What foods are not recommended? Foods that are high in calories or fat. Fried foods. Sweets. Products that are made from refined white flour, including white bread, pastries, white rice, and pasta. The items listed above may not be a complete list of foods and beverages to avoid. Contact a dietitian for more information. Summary  PCOS is a hormonal imbalance that affects a woman's reproductive system.  You can help to manage your PCOS by exercising regularly and eating a healthy, varied diet of vegetables, fruit, whole grains, low-fat (lean) protein, and low-fat dairy products.  Changing what you  eat can improve the way that your body uses insulin, help your hormones reach normal levels, and help you lose weight. This information is not intended to replace advice given to you by your health care provider. Make sure you discuss any questions you have with your health care provider. Document Revised: 04/20/2018 Document Reviewed: 11/02/2016 Elsevier Patient Education  2020 [employer].

## 2019-10-22 NOTE — Progress Notes (Signed)
GYN ENCOUNTER NOTE  Subjective:       Yesenia Davis is a 44 y.o. [phone] female is here for gynecologic evaluation of the following issues:  1. Breakthrough bleeding on OCP  Noticed lightheadedness, irregular bleeding, and "pulses" in her right abdominal area since starting OCP; however, pelvic pain has decreased.   Patient does not have a gallbladder.   Denies difficulty breathing or respiratory distress, chest pain, abdominal pain, excessive vaginal bleeding, dysuria, and leg pain or swelling.   Last seen in office on 09/14/2019; for further details, please see previous note. Today's visit was scheduled after CNM received a call from patient and PT during session last week.    Gynecologic History  Patient's last menstrual period was 09/28/2019 (exact date). Period Cycle (Days): 28 Period Duration (Days): 7 Period Pattern: (!) Irregular Menstrual Flow: Heavy Menstrual Control: Thin pad, Tampon Menstrual Control Change Freq (Hours): 30 minutes - 2 hours Dysmenorrhea: (!) Moderate Dysmenorrhea Symptoms: Cramping, Throbbing, Nausea  Contraception: OCP (estrogen/progesterone)  Last Pap: 01/2018. Results were: Neg/Neg  Last mammogram: 03/2019. Results were: normal  Obstetric History OB History  Gravida Para Term Preterm AB Living  4 3 3   1 3   SAB TAB Ectopic Multiple Live Births  1     0 3    # Outcome Date GA Lbr Len/2nd Weight Sex Delivery Anes PTL Lv  4 Term 10/18/15 [redacted]w[redacted]d / 00:06 6 lb 14.1 oz (3.12 kg) M Vag-Spont None  LIV  3 Term 2005   7 lb 1.8 oz (3.225 kg) M Vag-Spont   LIV  2 SAB 2003          1 Term 1996   7 lb 3.2 oz (3.266 kg) F Vag-Spont   LIV    Past Medical History:  Diagnosis Date  . Acne vulgaris   . Epigastric pain   . Gastritis   . GERD (gastroesophageal reflux disease)   . Gestational diabetes   . Migraines     Past Surgical History:  Procedure Laterality Date  . CHOLECYSTECTOMY  1996  . cyst removed     from neck- b9  . DILATION AND  CURETTAGE OF UTERUS      Current Outpatient Medications on File Prior to Visit  Medication Sig Dispense Refill  . acetaminophen (TYLENOL) 500 MG chewable tablet Chew 500 mg by mouth every 6 (six) hours as needed for pain.    . [employer] ACID CONTROLLER 10 MG tablet Take 10 mg by mouth 2 (two) times daily.    2004 docusate calcium (SURFAK) 240 MG capsule Take 240 mg by mouth daily as needed.    . Prenatal-DSS-FeCb-FeGl-FA (CITRANATAL BLOOM) 90-1 MG TABS Take 1 tablet by mouth daily. 30 tablet 11  . triamcinolone ointment (KENALOG) 0.1 % Apply 1 application topically 2 (two) times daily. Apply to affected area as needed for itch. 80 g 0  . VITAMIN D-1000 MAX ST 25 MCG (1000 UT) tablet Take 1,000 Units by mouth daily.     No current facility-administered medications on file prior to visit.    Allergies  Allergen Reactions  . Ciprofloxacin Hcl Rash    Social History   Socioeconomic History  . Marital status: Single    Spouse name: Not on file  . Number of children: Not on file  . Years of education: Not on file  . Highest education level: Not on file  Occupational History  . Not on file  Tobacco Use  . Smoking status: Never Smoker  .  Smokeless tobacco: Never Used  Vaping Use  . Vaping Use: Never used  Substance and Sexual Activity  . Alcohol use: No  . Drug use: No  . Sexual activity: Yes    Birth control/protection: None  Other Topics Concern  . Not on file  Social History Narrative  . Not on file   Social Determinants of Health   Financial Resource Strain:   . Difficulty of Paying Living Expenses: Not on file  Food Insecurity:   . Worried About Programme researcher, broadcasting/film/video in the Last Year: Not on file  . Ran Out of Food in the Last Year: Not on file  Transportation Needs:   . Lack of Transportation (Medical): Not on file  . Lack of Transportation (Non-Medical): Not on file  Physical Activity:   . Days of Exercise per Week: Not on file  . Minutes of Exercise per Session: Not on  file  Stress:   . Feeling of Stress : Not on file  Social Connections:   . Frequency of Communication with Friends and Family: Not on file  . Frequency of Social Gatherings with Friends and Family: Not on file  . Attends Religious Services: Not on file  . Active Member of Clubs or Organizations: Not on file  . Attends Banker Meetings: Not on file  . Marital Status: Not on file  Intimate Partner Violence:   . Fear of Current or Ex-Partner: Not on file  . Emotionally Abused: Not on file  . Physically Abused: Not on file  . Sexually Abused: Not on file    Family History  Problem Relation Age of Onset  . Breast cancer Maternal Aunt        40's  . Diabetes Father   . Diabetes Mother   . Ovarian cancer Neg Hx   . Colon cancer Neg Hx   . Heart disease Neg Hx     The following portions of the patient's history were reviewed and updated as appropriate: allergies, current medications, past family history, past medical history, past social history, past surgical history and problem list.  Review of Systems  ROS negative except as noted above. Information obtained from patient.   Objective:   BP 112/69   Pulse 62   Ht 5\' 3"  (1.6 m)   Wt 144 lb 3.2 oz (65.4 kg)   LMP 09/28/2019 (Exact Date)   BMI 25.54 kg/m    CONSTITUTIONAL: Well-developed, well-nourished female in no acute distress.   ABDOMEN: Soft, non distended; Non tender.  No Organomegaly.  MUSCULOSKELETAL: Normal range of motion. No tenderness.  No cyanosis, clubbing, or edema.  Assessment:   1. Pelvic pain  - 09/30/2019 PELVIS (TRANSABDOMINAL ONLY); Future - US PELVIS TRANSVAGINAL NON-OB (TV ONLY); Future  2. Breakthrough bleeding on OCPs  - US PELVIS (TRANSABDOMINAL ONLY); Future - US PELVIS TRANSVAGINAL NON-OB (TV ONLY); Future  3. PCOS (polycystic ovarian syndrome)  - US PELVIS (TRANSABDOMINAL ONLY); Future - US PELVIS TRANSVAGINAL NON-OB (TV ONLY); Future     Plan:   Discussed bleeding  management options.   Sample of Balcoltra given.   Reviewed red flag symptoms and when to call.   RTC x 4-6 weeks for repeat ultrasound and follow up or sooner if needed.    [place], CNM [facility], [facility]

## 2019-10-23 ENCOUNTER — Ambulatory Visit: Payer: BC Managed Care – PPO | Admitting: Physical Therapy

## 2019-10-30 ENCOUNTER — Ambulatory Visit: Payer: BC Managed Care – PPO | Admitting: Physical Therapy

## 2019-11-02 ENCOUNTER — Ambulatory Visit (INDEPENDENT_AMBULATORY_CARE_PROVIDER_SITE_OTHER): Payer: BC Managed Care – PPO | Admitting: Dermatology

## 2019-11-02 ENCOUNTER — Other Ambulatory Visit: Payer: Self-pay

## 2019-11-02 DIAGNOSIS — L659 Nonscarring hair loss, unspecified: Secondary | ICD-10-CM

## 2019-11-02 DIAGNOSIS — L280 Lichen simplex chronicus: Secondary | ICD-10-CM | POA: Diagnosis not present

## 2019-11-02 NOTE — Patient Instructions (Addendum)
May use TMC 0.1% ointment up to 2-3 days.  If not resolved please call for recheck.   Topical steroids (such as triamcinolone, fluocinolone, fluocinonide, mometasone, clobetasol, halobetasol, betamethasone, hydrocortisone) can cause thinning and lightening of the skin if they are used for too long in the same area. Your physician has selected the right strength medicine for your problem and area affected on the body. Please use your medication only as directed by your physician to prevent side effects.

## 2019-11-02 NOTE — Progress Notes (Signed)
   Follow-Up Visit   Subjective  Yesenia Davis is a 44 y.o. female who presents for the following: Follow-up.  Patient here today for follow up for rash at vaginal area. She is using TMC 0.1% ointment and patient advises it has improved. She was also seen for hair loss which is the same. Labs were done and showed low Vitamin D which she has started taking as a supplement.   The following portions of the chart were reviewed this encounter and updated as appropriate:  Tobacco  Allergies  Meds  Problems  Med Hx  Surg Hx  Fam Hx      Review of Systems:  No other skin or systemic complaints except as noted in HPI or Assessment and Plan.  Objective  Well appearing patient in no apparent distress; mood and affect are within normal limits.  A focused examination was performed including scalp, groin. Relevant physical exam findings are noted in the Assessment and Plan.  Objective  Pubic: Resolved  Objective  Scalp: Diffuse thinning   Assessment & Plan  Lichen simplex chronicus Pubic  Favor vulvar LSC, resolved s/p TMC   May use TMC 0.1% ointment up to 2-3 days. If not resolved please call for recheck.   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  Anise Salvo, RMA, am acting as scribe for Darden Dates, MD .  Documentation: I have reviewed the above documentation for accuracy and completeness, and I agree with the above.  Darden Dates, MD

## 2019-11-04 ENCOUNTER — Encounter: Payer: Self-pay | Admitting: Dermatology

## 2019-11-06 ENCOUNTER — Ambulatory Visit: Payer: BC Managed Care – PPO | Admitting: Physical Therapy

## 2019-11-13 ENCOUNTER — Ambulatory Visit: Payer: BC Managed Care – PPO | Admitting: Physical Therapy

## 2019-11-20 ENCOUNTER — Ambulatory Visit: Payer: BC Managed Care – PPO | Admitting: Physical Therapy

## 2019-11-21 ENCOUNTER — Other Ambulatory Visit: Payer: Self-pay

## 2019-11-21 ENCOUNTER — Ambulatory Visit: Payer: BC Managed Care – PPO | Attending: Certified Nurse Midwife | Admitting: Physical Therapy

## 2019-11-21 DIAGNOSIS — M6281 Muscle weakness (generalized): Secondary | ICD-10-CM | POA: Insufficient documentation

## 2019-11-21 DIAGNOSIS — M533 Sacrococcygeal disorders, not elsewhere classified: Secondary | ICD-10-CM | POA: Diagnosis not present

## 2019-11-21 NOTE — Patient Instructions (Addendum)
  Open book ( see sheet)  To improve midback mobilty   Clam Shell 45 Degrees To strengthen hips  Lying with hips and knees bent 45, one pillow between knees and ankles. Heel together, toes apart like ballerina,  Lift knee with exhale while pressing heels together. Be sure pelvis does not roll backward. Do not arch back. Do 20 times, each leg, 2 times per day.     Complimentary stretch: Figure-4   3 breaths  * Keep pelvis levelled with tactile cue with hand under back of hips  * Slide the ankle of the supporting foot out to decrease the angle which can help level the pelvis   Cross thigh over thigh, exhale to hug the thighs in with arms pulling back of thigh, shoulders/ head is relaxed down , Use towel behind thigh is needed.  10 reps   ___  Multifidis twist  To strengthen back Band is on doorknob: stand further away from door (facing perpendicular)   Twisting trunk without moving the hips and knees Hold band at the level of ribcage, elbows bent,shoulder blades roll back and down like squeezing a pencil under armpit    Exhale twist,.10-15 deg away from door without moving your hips/ knees. Continue to maintain equal weight through legs. Keep knee unlocked.  10 x 2    ___    kitchen counter stretches to lengthen back   Hands on kitchen counter,   Palms shoulder width apart  Minisquat postion Trunk is parallel to floor  A) Pull buttocks back to lengthen spine, knees bent  3 breaths   B) Bring R hand to the L, and stretch the R side trunk  3 breaths   Brings hands to center again Do the same to the L side stretch by placing L hand on top of R   D) Modified thread the needle R hand on L thigh, L  thigh pushing out slightly as the R hands pull in,  elbow bent and pulls to theR,  Look under L armpit   Do the same to other side  ____

## 2019-11-21 NOTE — Therapy (Signed)
[place] [facility] MAIN [facility] SERVICES [address] [place], [place], [address] Phone: [phone]   Fax:  [phone]  Physical Therapy Treatment  Patient Details  Name: Yesenia Davis MRN: [id number] Date of Birth: Mar 21, 1975 Referring Provider (PT): Serafina Royals    Encounter Date: 11/21/2019   PT End of Session - 11/21/19 1719    Visit Number 2    Number of Visits 10    Date for PT Re-Evaluation 12/25/19    PT Start Time 1709    PT Stop Time 1806    PT Time Calculation (min) 57 min           Past Medical History:  Diagnosis Date  . Acne vulgaris   . Epigastric pain   . Gastritis   . GERD (gastroesophageal reflux disease)   . Gestational diabetes   . Migraines     Past Surgical History:  Procedure Laterality Date  . CHOLECYSTECTOMY  1996  . cyst removed     from neck- b9  . DILATION AND CURETTAGE OF UTERUS      There were no vitals filed for this visit.   Subjective Assessment - 11/21/19 1710    Subjective Pt reports R side pain comes and goes more frequently but no as sharp. Pt will undergo a [place] 11/23/19. Pt did not have her period last month and it started 11/13/19. The period was not heavy and for a short time period.    Patient Stated Goals get abdominal / ovary pain down, play with son,              [facility] PT Assessment - 11/21/19 1712      Observation/Other Assessments   Observations poor dissassociation between trunk/ pelvis       Strength   Overall Strength Comments hip abd L 3+/5, R 4/5       Palpation   Spinal mobility increased tensions at thoracic     SI assessment  levelled iliac crests B     Palpation comment B SIJ mobility restored                       Pelvic Floor Special Questions - 11/21/19 1714    Diastasis Recti resolved              [facility] Adult PT Treatment/Exercise - 11/21/19 1718      Neuro Re-ed    Neuro Re-ed Details  cued for new HEP       Exercises   Exercises  --   see pt instructions     Manual Therapy   Manual therapy comments MWM/STM at thoracic                         PT Long Term Goals - 10/16/19 1628      PT LONG TERM GOAL #1   Title Pt will be able to sit on hard chair or sit on the floor for > 15 min and be able to get up easily to play with her son    Time 6    Period Weeks    Status New    Target Date 11/27/19      PT LONG TERM GOAL #2   Title Pt will report improved FOTO score for pelvic pain from 50pts by 10 pts change in order to improve QOL    Time 10    Period Weeks    Status New  Target Date 12/25/19      PT LONG TERM GOAL #3   Title Pt will demo increased hip abduction from 3/5 B to > 4/5 in order improve pelvic girdle stability for functional mobility    Time 8    Period Weeks    Status New    Target Date 12/12/19      PT LONG TERM GOAL #4   Title Pt will be IND with deep core coordination and exercises and pelvic floor mm to improve IAP system and to improve prolapse    Time 4    Period Weeks    Status New    Target Date 11/14/19      PT LONG TERM GOAL #5   Title Pt will demo decreased abdominal separation from 3 fingers width to < 2 fingers width in order to improve IAP system for motility and mobility    Time 6    Period Weeks    Status New    Target Date 11/28/19      Additional Long Term Goals   Additional Long Term Goals Yes      PT LONG TERM GOAL #6   Title Pt will report increased bowel movement frequency from 2-3x week to daily or every other day with no more straining in order to minimize prolapse and improve GI health and pelvic health    Time 10    Period Weeks    Status New    Target Date 12/26/19                 Plan - 11/21/19 1801    Clinical Impression Statement Pt showed good carry over from first session with equal pelvic girdle alignment but requried manual Tx to minimize thoracic hypomobility. Pt also required excessive cues for posterior tilt of pelvic  and anticipate pt will improve after she gains  paraspinal mm flexibility and thoracic mobility improvements. Added multifidis strengtehning which pt required cues for improved dissassociation between trunk and pelvis. Pt was IND with thoracic mobilty HEP. Pt continues to benefit from skilled PT    Examination-Activity Limitations Continence    Rehab Potential Good    PT Frequency 1x / week    PT Duration Other (comment)   10   PT Treatment/Interventions Neuromuscular re-education;Stair training;Therapeutic activities;Functional mobility training;Moist Heat;Manual techniques;Patient/family education;Taping;Scar mobilization;Balance training;Energy conservation;Gait training;Therapeutic exercise;Biofeedback;Cryotherapy    Consulted and Agree with Plan of Care Patient           Patient will benefit from skilled therapeutic intervention in order to improve the following deficits and impairments:  Improper body mechanics, Pain, Increased muscle spasms, Decreased mobility, Decreased endurance, Decreased range of motion, Decreased coordination, Decreased balance, Difficulty walking, Decreased safety awareness, Decreased strength, Hypomobility, Hypermobility, Decreased scar mobility  Visit Diagnosis: Sacrococcygeal disorders, not elsewhere classified  Muscle weakness (generalized)     Problem List Patient Active Problem List   Diagnosis Date Noted  . Postpartum depression 11/29/2015  . Gestational diabetes mellitus 10/18/2015    Mariane Masters ,PT, DPT, E-RYT  11/21/2019, 6:04 PM  [place] [facility] MAIN [facility] SERVICES [address] [place], [place], [address] Phone: [phone]   Fax:  [phone]  Name: Shanyia Stines MRN: [id number] Date of Birth: 1975-01-19

## 2019-11-23 ENCOUNTER — Other Ambulatory Visit: Payer: Self-pay

## 2019-11-23 ENCOUNTER — Encounter: Payer: Self-pay | Admitting: Certified Nurse Midwife

## 2019-11-23 ENCOUNTER — Ambulatory Visit (INDEPENDENT_AMBULATORY_CARE_PROVIDER_SITE_OTHER): Payer: BC Managed Care – PPO | Admitting: Certified Nurse Midwife

## 2019-11-23 ENCOUNTER — Ambulatory Visit (INDEPENDENT_AMBULATORY_CARE_PROVIDER_SITE_OTHER): Payer: BC Managed Care – PPO

## 2019-11-23 DIAGNOSIS — N921 Excessive and frequent menstruation with irregular cycle: Secondary | ICD-10-CM | POA: Diagnosis not present

## 2019-11-23 DIAGNOSIS — E282 Polycystic ovarian syndrome: Secondary | ICD-10-CM

## 2019-11-23 DIAGNOSIS — R102 Pelvic and perineal pain: Secondary | ICD-10-CM | POA: Diagnosis not present

## 2019-11-23 NOTE — Progress Notes (Signed)
GYN ENCOUNTER NOTE  Subjective:       Yesenia Davis is a 44 y.o. [phone] female here for follow up regarding pelvic pain and PCOS; for further details, see previous note.   Started OCP on 11/19/2019. Reports two episodes of pelvic pain during menses relieved by OTC Motrin.   Denies difficulty breathing or respiratory distress, chest pain, dysuria, and leg pain or swelling.    Gynecologic History  Patient's last menstrual period was 11/13/2019 (exact date).   Contraception: OCP (estrogen/progesterone)   Last Pap: 01/2018. Results were: Neg/Neg  Last mammogram: 03/2019. Results were: normal  Obstetric History  OB History  Gravida Para Term Preterm AB Living  4 3 3   1 3   SAB TAB Ectopic Multiple Live Births  1     0 3    # Outcome Date GA Lbr Len/2nd Weight Sex Delivery Anes PTL Lv  4 Term 10/18/15 [redacted]w[redacted]d / 00:06 6 lb 14.1 oz (3.12 kg) M Vag-Spont None  LIV  3 Term 2005   7 lb 1.8 oz (3.225 kg) M Vag-Spont   LIV  2 SAB 2003          1 Term 1996   7 lb 3.2 oz (3.266 kg) F Vag-Spont   LIV    Past Medical History:  Diagnosis Date  . Acne vulgaris   . Epigastric pain   . Gastritis   . GERD (gastroesophageal reflux disease)   . Gestational diabetes   . Migraines     Past Surgical History:  Procedure Laterality Date  . CHOLECYSTECTOMY  1996  . cyst removed     from neck- b9  . DILATION AND CURETTAGE OF UTERUS      Current Outpatient Medications on File Prior to Visit  Medication Sig Dispense Refill  . acetaminophen (TYLENOL) 500 MG chewable tablet Chew 500 mg by mouth every 6 (six) hours as needed for pain.    . CVS ACID CONTROLLER 10 MG tablet Take 10 mg by mouth 2 (two) times daily.    2004 docusate calcium (SURFAK) 240 MG capsule Take 240 mg by mouth daily as needed.    . Prenatal-DSS-FeCb-FeGl-FA (CITRANATAL BLOOM) 90-1 MG TABS Take 1 tablet by mouth daily. 30 tablet 11  . triamcinolone ointment (KENALOG) 0.1 % Apply 1 application topically 2 (two) times daily.  Apply to affected area as needed for itch. 80 g 0  . VITAMIN D-1000 MAX ST 25 MCG (1000 UT) tablet Take 1,000 Units by mouth daily.     No current facility-administered medications on file prior to visit.    Allergies  Allergen Reactions  . Ciprofloxacin Hcl Rash    Social History   Socioeconomic History  . Marital status: Single    Spouse name: Not on file  . Number of children: Not on file  . Years of education: Not on file  . Highest education level: Not on file  Occupational History  . Not on file  Tobacco Use  . Smoking status: Never Smoker  . Smokeless tobacco: Never Used  Vaping Use  . Vaping Use: Never used  Substance and Sexual Activity  . Alcohol use: No  . Drug use: No  . Sexual activity: Yes    Birth control/protection: Pill  Other Topics Concern  . Not on file  Social History Narrative  . Not on file   Social Determinants of Health   Financial Resource Strain:   . Difficulty of Paying Living Expenses: Not on file  Food Insecurity:   .  Worried About Programme researcher, broadcasting/film/video in the Last Year: Not on file  . Ran Out of Food in the Last Year: Not on file  Transportation Needs:   . Lack of Transportation (Medical): Not on file  . Lack of Transportation (Non-Medical): Not on file  Physical Activity:   . Days of Exercise per Week: Not on file  . Minutes of Exercise per Session: Not on file  Stress:   . Feeling of Stress : Not on file  Social Connections:   . Frequency of Communication with Friends and Family: Not on file  . Frequency of Social Gatherings with Friends and Family: Not on file  . Attends Religious Services: Not on file  . Active Member of Clubs or Organizations: Not on file  . Attends Banker Meetings: Not on file  . Marital Status: Not on file  Intimate Partner Violence:   . Fear of Current or Ex-Partner: Not on file  . Emotionally Abused: Not on file  . Physically Abused: Not on file  . Sexually Abused: Not on file     Family History  Problem Relation Age of Onset  . Breast cancer Maternal Aunt        40's  . Diabetes Father   . Diabetes Mother   . Ovarian cancer Neg Hx   . Colon cancer Neg Hx   . Heart disease Neg Hx     The following portions of the patient's history were reviewed and updated as appropriate: allergies, current medications, past family history, past medical history, past social history, past surgical history and problem list.  Review of Systems  ROS negative except as noted above. Information obtained from patient.   Objective:   BP 96/66   Pulse 63   Ht 5\' 3"  (1.6 m)   Wt 146 lb 12.8 oz (66.6 kg)   BMI 26.00 kg/m    CONSTITUTIONAL: Well-developed, well-nourished female in no acute distress.   PHYSICAL EXAM: Not indicated.   ULTRASOUND REPORT  Location: [facility]  Date of Service: 11/23/2019   Indications : F/U  Findings:  The uterus is retroverted and measures 7.3 x 4.0 x 5.3 cm. Echo texture is homogenous without evidence of focal masses.  The Endometrium measures 8 mm.  Right Ovary measures 3.8 x 2.2 x 2.1 cm. It is normal in appearance. Peripheral follicular cysts measuring from 3-8 mm. Left Ovary measures 2.3 x 1.9 x 1.8 cm. It is normal in appearance.Peripheral follicular cysts measuring from 3-8 mm. Larger simple cyst that is pedunculated of the left ovary measuring 1.5 x 1.1 x 1.1  Survey of the adnexa demonstrates no adnexal masses. There is no free fluid in the cul de sac.  Impression: 1. Bilateral cystic ovaries as described above.  Recommendations: 1.Clinical correlation with the patient's History and Physical Exam.   Assessment:   1. Pelvic pain  - 13/11/2019 PELVIS (TRANSABDOMINAL ONLY)  2. Breakthrough bleeding on OCPs  - US PELVIS (TRANSABDOMINAL ONLY)  3. PCOS (polycystic ovarian syndrome)  - US PELVIS (TRANSABDOMINAL ONLY)   Plan:   Ultrasound findings discussed with patient, verbalized understanding.   Education  regarding PCOS and management options. Patient wishes to continue Balcoltra at this time. Additional samples provided.  Reviewed red flag symptoms and when to call.   RTC x 3 months for TELEVISIT follow up or sooner if needed.    [place], CNM [facility], [facility] 11/23/19 4:29 PM

## 2019-11-23 NOTE — Patient Instructions (Addendum)
Ethinyl Estradiol; Levonorgestrel; Ferrous bisglycinate oral tablets What is this medicine? ETHINYL ESTRADIOL; LEVONORGESTREL; FERROUS BISGLYCINATE (ETH in il es tra DYE ole; LEE voh nor jes trel; FER [place] bis GLY sin ate) is an oral contraceptive (birth control pill). This product combines two types of female hormones, an estrogen and a progestin. It is used to prevent ovulation and pregnancy. This medicine may be used for other purposes; ask your health care provider or pharmacist if you have questions. COMMON BRAND NAME(S): Balcoltra What should I tell my health care provider before I take this medicine? They need to know if you have any of these conditions:  abnormal vaginal bleeding  blood vessel disease  breast, cervical, endometrial, ovarian, liver, or uterine cancer  diabetes  gallbladder disease  heart disease or recent heart attack  high blood pressure  high cholesterol  history of blood clots  kidney disease  liver disease  migraine headaches  smoke tobacco  stroke  systemic lupus erythematosus (SLE)  an unusual or allergic reaction to estrogens, progestins, other medicines, foods, dyes, or preservatives  pregnant or trying to get pregnant  breast-feeding How should I use this medicine? Take this medicine by mouth. To reduce nausea, this medicine may be taken with food. Follow the directions on the prescription label. Take this medicine at the same time each day and in the order directed on the package. Do not take your medicine more often than directed. A patient package insert for the product will be given with each prescription and refill. Read this sheet carefully each time. The sheet may change frequently. Contact your pediatrician regarding the use of this medicine in children. Special care may be needed. This medicine has been used in female children who have started having menstrual periods. Overdosage: If you think you have taken too much of this  medicine contact a poison control center or emergency room at once. NOTE: This medicine is only for you. Do not share this medicine with others. What if I miss a dose? If you miss a dose, refer to the patient information sheet you received with your medicine for direction. If you miss more than one pill, this medicine may not be as effective and you may need to use another form of birth control. What may interact with this medicine? Do not take this medicine with the following medication:  dasabuvir; ombitasvir; paritaprevir; ritonavir  ombitasvir; paritaprevir; ritonavir This medicine may also interact with the following medications:  acetaminophen  antibiotics or medicines for infections, especially rifampin, rifabutin, rifapentine, and griseofulvin, and possibly penicillins or tetracyclines  aprepitant  ascorbic acid (vitamin C)  atorvastatin  barbiturate medicines, such as phenobarbital  bosentan  caffeine  carbamazepine  clofibrate  cyclosporine  dantrolene  doxercalciferol  felbamate  grapefruit juice  hydrocortisone  medicines for anxiety or sleeping problems, such as diazepam or temazepam  medicines for diabetes, including pioglitazone  mineral oil  modafinil  mycophenolate  nefazodone  oxcarbazepine  phenytoin  prednisolone  ritonavir or other medicines for HIV infection or AIDS  rosuvastatin  selegiline  soy isoflavones supplements  St. John's wort  tamoxifen or raloxifene  theophylline  thyroid hormones  topiramate  warfarin This list may not describe all possible interactions. Give your health care provider a list of all the medicines, herbs, non-prescription drugs, or dietary supplements you use. Also tell them if you smoke, drink alcohol, or use illegal drugs. Some items may interact with your medicine. What should I watch for while using this medicine?  Visit your doctor or health care professional for regular checks on  your progress. You will need a regular breast and pelvic exam and Pap smear while on this medicine. Use an additional method of contraception during the first cycle that you take these tablets. If you have any reason to think you are pregnant, stop taking this medicine right away and contact your doctor or health care professional. If you are taking this medicine for hormone related problems, it may take several cycles of use to see improvement in your condition. Smoking increases the risk of getting a blood clot or having a stroke while you are taking birth control pills, especially if you are more than 43 years old. You are strongly advised not to smoke. This medicine can make your body retain fluid, making your fingers, hands, or ankles swell. Your blood pressure can go up. Contact your doctor or health care professional if you feel you are retaining fluid. This medicine can make you more sensitive to the sun. Keep out of the sun. If you cannot avoid being in the sun, wear protective clothing and use sunscreen. Do not use sun lamps or tanning beds/booths. If you wear contact lenses and notice visual changes, or if the lenses begin to feel uncomfortable, consult your eye care specialist. In some women, tenderness, swelling, or minor bleeding of the gums may occur. Notify your dentist if this happens. Brushing and flossing your teeth regularly may help limit this. See your dentist regularly and inform your dentist of the medicines you are taking. If you are going to have elective surgery, you may need to stop taking this medicine before the surgery. Consult your health care professional for advice. This medicine does not protect you against HIV infection (AIDS) or any other sexually transmitted diseases. What side effects may I notice from receiving this medicine? Side effects that you should report to your doctor or health care professional as soon as possible:  allergic reactions like skin rash,  itching or hives, swelling of the face, lips, or tongue  breast tissue changes or discharge  changes in vaginal bleeding during your period or between your periods  changes in vision  chest pain  confusion  coughing up blood  dizziness  feeling faint or lightheaded  headaches or migraines  leg, arm or groin pain  loss of balance or coordination  severe or sudden headaches  stomach pain (severe)  sudden shortness of breath  sudden numbness or weakness of the face, arm or leg  symptoms of vaginal infection like itching, irritation or unusual discharge  tenderness in the upper abdomen  trouble speaking or understanding  vomiting  yellowing of the eyes or skin Side effects that usually do not require medical attention (report these to your doctor or health care professional if they continue or are bothersome):  breakthrough bleeding and spotting that continues beyond the 3 initial cycles of pills  breast tenderness  mood changes, anxiety, depression, frustration, anger, or emotional outbursts  increased sensitivity to sun or ultraviolet light  nausea  skin rash, acne, or brown spots on the skin  weight gain (slight) This list may not describe all possible side effects. Call your doctor for medical advice about side effects. You may report side effects to FDA at 1-800-FDA-1088. Where should I keep my medicine? Keep out of the reach of children. Store at room temperature between 15 and 30 degrees C (59 and 86 degrees F). Throw away any unused medicine after the expiration date.  NOTE: This sheet is a summary. It may not cover all possible information. If you have questions about this medicine, talk to your doctor, pharmacist, or health care provider.  2020 Elsevier/Gold Standard (2016-02-03 15:12:39)   Polycystic Ovarian Syndrome  Polycystic ovarian syndrome (PCOS) is a common hormonal disorder among women of reproductive age. In most women with PCOS, many  small fluid-filled sacs (cysts) grow on the ovaries, and the cysts are not part of a normal menstrual cycle. PCOS can cause problems with your menstrual periods and make it difficult to get pregnant. It can also cause an increased risk of miscarriage with pregnancy. If it is not treated, PCOS can lead to serious health problems, such as diabetes and heart disease. What are the causes? The cause of PCOS is not known, but it may be the result of a combination of certain factors, such as:  Irregular menstrual cycle.  High levels of certain hormones (androgens).  Problems with the hormone that helps to control blood sugar (insulin resistance).  Certain genes. What increases the risk? This condition is more likely to develop in women who have a family history of PCOS. What are the signs or symptoms? Symptoms of PCOS may include:  Multiple ovarian cysts.  Infrequent periods or no periods.  Periods that are too frequent or too heavy.  Unpredictable periods.  Inability to get pregnant (infertility) because of not ovulating.  Increased growth of hair on the face, chest, stomach, back, thumbs, thighs, or toes.  Acne or oily skin. Acne may develop during adulthood, and it may not respond to treatment.  Pelvic pain.  Weight gain or obesity.  Patches of thickened and dark brown or black skin on the neck, arms, breasts, or thighs (acanthosis nigricans).  Excess hair growth on the face, chest, abdomen, or upper thighs (hirsutism). How is this diagnosed? This condition is diagnosed based on:  Your medical history.  A physical exam, including a pelvic exam. Your health care provider may look for areas of increased hair growth on your skin.  Tests, such as: ? Ultrasound. This may be used to examine the ovaries and the lining of the uterus (endometrium) for cysts. ? Blood tests. These may be used to check levels of sugar (glucose), female hormone (testosterone), and female hormones (estrogen  and progesterone) in your blood. How is this treated? There is no cure for PCOS, but treatment can help to manage symptoms and prevent more health problems from developing. Treatment varies depending on:  Your symptoms.  Whether you want to have a baby or whether you need birth control (contraception). Treatment may include nutrition and lifestyle changes along with:  Progesterone hormone to start a menstrual period.  Birth control pills to help you have regular menstrual periods.  Medicines to make you ovulate, if you want to get pregnant.  Medicine to reduce excessive hair growth.  Surgery, in severe cases. This may involve making small holes in one or both of your ovaries. This decreases the amount of testosterone that your body produces. Follow these instructions at home:  Take over-the-counter and prescription medicines only as told by your health care provider.  Follow a healthy meal plan. This can help you reduce the effects of PCOS. ? Eat a healthy diet that includes lean proteins, complex carbohydrates, fresh fruits and vegetables, low-fat dairy products, and healthy fats. Make sure to eat enough fiber.  If you are overweight, lose weight as told by your health care provider. ? Losing 10% of your body  weight may improve symptoms. ? Your health care provider can determine how much weight loss is best for you and can help you lose weight safely.  Keep all follow-up visits as told by your health care provider. This is important. Contact a health care provider if:  Your symptoms do not get better with medicine.  You develop new symptoms. This information is not intended to replace advice given to you by your health care provider. Make sure you discuss any questions you have with your health care provider. Document Revised: 12/11/2016 Document Reviewed: 06/16/2015 Elsevier Patient Education  2020 Elsevier Inc.   Diet for Polycystic Ovary Syndrome Polycystic ovary  syndrome (PCOS) is a disorder of the chemicals (hormones) that regulate a woman's reproductive system, including monthly periods (menstruation). The condition causes important hormones to be out of balance. PCOS can:  Stop your periods or make them irregular.  Cause cysts to develop on your ovaries.  Make it difficult to get pregnant.  Stop your body from responding to the effects of insulin (insulin resistance). Insulin resistance can lead to obesity and diabetes. Changing what you eat can help you manage PCOS and improve your health. Following a balanced diet can help you lose weight and improve the way that your body uses insulin. What are tips for following this plan?  Follow a balanced diet for meals and snacks. Eat breakfast, lunch, dinner, and one or two snacks every day.  Include protein in each meal and snack.  Choose whole grains instead of products that are made with refined flour.  Eat a variety of foods.  Exercise regularly as told by your health care provider. Aim to do 30 or more minutes of exercise on most days of the week.  If you are overweight or obese: ? Pay attention to how many calories you eat. Cutting down on calories can help you lose weight. ? Work with your health care provider or a diet and nutrition specialist (dietitian) to figure out how many calories you need each day. What foods can I eat?  Fruits Include a variety of colors and types. All fruits are helpful for PCOS. Vegetables Include a variety of colors and types. All vegetables are helpful for PCOS. Grains Whole grains, such as whole wheat. Whole-grain breads, crackers, cereals, and pasta. Unsweetened oatmeal, bulgur, barley, quinoa, and brown rice. Tortillas made from corn or whole-wheat flour. Meats and other proteins Low-fat (lean) proteins, such as fish, chicken, beans, eggs, and tofu. Dairy Low-fat dairy products, such as skim milk, cheese sticks, and yogurt. Beverages Low-fat or  fat-free drinks, such as water, low-fat milk, sugar-free drinks, and small amounts of 100% fruit juice. Seasonings and condiments Ketchup. Mustard. Barbecue sauce. Relish. Low-fat or fat-free mayonnaise. Fats and oils Olive oil or canola oil. Walnuts and almonds. The items listed above may not be a complete list of recommended foods and beverages. Contact a dietitian for more options. What foods are not recommended? Foods that are high in calories or fat. Fried foods. Sweets. Products that are made from refined white flour, including white bread, pastries, white rice, and pasta. The items listed above may not be a complete list of foods and beverages to avoid. Contact a dietitian for more information. Summary  PCOS is a hormonal imbalance that affects a woman's reproductive system.  You can help to manage your PCOS by exercising regularly and eating a healthy, varied diet of vegetables, fruit, whole grains, low-fat (lean) protein, and low-fat dairy products.  Changing what you  eat can improve the way that your body uses insulin, help your hormones reach normal levels, and help you lose weight. This information is not intended to replace advice given to you by your health care provider. Make sure you discuss any questions you have with your health care provider. Document Revised: 04/20/2018 Document Reviewed: 11/02/2016 Elsevier Patient Education  2020 Elsevier Inc.  

## 2019-11-27 ENCOUNTER — Other Ambulatory Visit: Payer: Self-pay

## 2019-11-27 ENCOUNTER — Ambulatory Visit: Payer: BC Managed Care – PPO | Admitting: Physical Therapy

## 2019-11-27 DIAGNOSIS — M533 Sacrococcygeal disorders, not elsewhere classified: Secondary | ICD-10-CM

## 2019-11-27 DIAGNOSIS — M6281 Muscle weakness (generalized): Secondary | ICD-10-CM

## 2019-11-27 NOTE — Therapy (Addendum)
[facility] MAIN [facility] SERVICES [address] [place], [place], [address] Phone: [phone]   Fax:  [phone]  Physical Therapy Treatment  Patient Details  Name: Yesenia Davis MRN: [id number] Date of Birth: 1975/09/12 Referring Provider (PT): Serafina Royals    Encounter Date: 11/27/2019    Past Medical History:  Diagnosis Date  . Acne vulgaris   . Epigastric pain   . Gastritis   . GERD (gastroesophageal reflux disease)   . Gestational diabetes   . Migraines     Past Surgical History:  Procedure Laterality Date  . CHOLECYSTECTOMY  1996  . cyst removed     from neck- b9  . DILATION AND CURETTAGE OF UTERUS      There were no vitals filed for this visit.   Subjective Assessment - 12/01/19 1955    Subjective Pt saw her gynecologist and the US showed a bigger cyst on her L side which explains her L sided pain. Pt has not felt  R sided pelvic pain for 1 week since last session.    Patient Stated Goals get abdominal / ovary pain down, play with son,              [facility] PT Assessment - 12/01/19 1956      Palpation   SI assessment  B iliac crest levelled    T/L junction with tightness along paraspinal mm                          [facility] Adult PT Treatment/Exercise - 12/01/19 1955      Therapeutic Activites    Other Therapeutic Activities educated about not straining pelvic floor by avoiding sit ups and practicing log rolling in/out bed, reviewed last week's HEP        Neuro Re-ed    Neuro Re-ed Details  cued for deep core level 1 and 2       Manual Therapy   Manual therapy comments MWM/STM at T/L junction R to minimize mm tightness                        PT Long Term Goals - 11/27/19 1955      PT LONG TERM GOAL #1   Title Pt will be able to sit on hard chair or sit on the floor for > 15 min and be able to get up easily to play with her son    Time 6    Period Weeks    Status New       PT LONG TERM GOAL #2   Title Pt will report improved FOTO score for pelvic pain from 50pts by 10 pts change in order to improve QOL    Time 10    Period Weeks    Status New      PT LONG TERM GOAL #3   Title Pt will demo increased hip abduction from 3/5 B to > 4/5 in order improve pelvic girdle stability for functional mobility    Time 8    Period Weeks    Status New      PT LONG TERM GOAL #4   Title Pt will be IND with deep core coordination and exercises and pelvic floor mm to improve IAP system and to improve prolapse    Time 4    Period Weeks    Status New      PT LONG TERM GOAL #5  Title Pt will demo decreased abdominal separation from 3 fingers width to < 2 fingers width in order to improve IAP system for motility and mobility    Time 6    Period Weeks    Status New      PT LONG TERM GOAL #6   Title Pt will report increased bowel movement frequency from 2-3x week to daily or every other day with no more straining in order to minimize prolapse and improve GI health and pelvic health    Time 10    Period Weeks    Status New                 Plan - 11/27/19 1707    Clinical Impression Statement Pt demo'd good carry over with levelled pelvic girdle but continued to require more manual Tx to minimize tightness at T/L junction which will in turn promote better diaphragmatic/ pelvic floor and IAP function. Pt demo'd proper technique for deep core coordination following cues. Anticipate this will help improve DRA and IAp system for pelvic floor related dysfunctions. Plan to assess pelvic floor at upcoming session. Pt continues to benefit from skilled PT     Examination-Activity Limitations Continence    Rehab Potential Good    PT Frequency 1x / week    PT Duration Other (comment)   10   PT Treatment/Interventions Neuromuscular re-education;Stair training;Therapeutic activities;Functional mobility training;Moist Heat;Manual techniques;Patient/family education;Taping;Scar  mobilization;Balance training;Energy conservation;Gait training;Therapeutic exercise;Biofeedback;Cryotherapy    Consulted and Agree with Plan of Care Patient           Patient will benefit from skilled therapeutic intervention in order to improve the following deficits and impairments:  Improper body mechanics, Pain, Increased muscle spasms, Decreased mobility, Decreased endurance, Decreased range of motion, Decreased coordination, Decreased balance, Difficulty walking, Decreased safety awareness, Decreased strength, Hypomobility, Hypermobility, Decreased scar mobility  Visit Diagnosis: Sacrococcygeal disorders, not elsewhere classified  Muscle weakness (generalized)     Problem List Patient Active Problem List   Diagnosis Date Noted  . Postpartum depression 11/29/2015  . Gestational diabetes mellitus 10/18/2015    Mariane Masters ,PT, DPT, E-RYT  12/04/2019, 9:56 AM  [facility] MAIN [facility] SERVICES [address] [place], [place], [address] Phone: [phone]   Fax:  [phone]  Name: Yesenia Davis MRN: [id number] Date of Birth: [birth year]

## 2019-11-28 NOTE — Patient Instructions (Addendum)
Deep core level 1 and 2  

## 2019-12-04 ENCOUNTER — Ambulatory Visit: Payer: BC Managed Care – PPO | Admitting: Physical Therapy

## 2019-12-11 ENCOUNTER — Ambulatory Visit: Payer: BC Managed Care – PPO | Admitting: Physical Therapy

## 2019-12-18 ENCOUNTER — Ambulatory Visit: Payer: BC Managed Care – PPO | Attending: Certified Nurse Midwife | Admitting: Physical Therapy

## 2019-12-18 ENCOUNTER — Other Ambulatory Visit: Payer: Self-pay

## 2019-12-18 DIAGNOSIS — M6281 Muscle weakness (generalized): Secondary | ICD-10-CM | POA: Diagnosis present

## 2019-12-18 DIAGNOSIS — M533 Sacrococcygeal disorders, not elsewhere classified: Secondary | ICD-10-CM | POA: Insufficient documentation

## 2019-12-18 NOTE — Patient Instructions (Addendum)
Clam shells on L leg only ( R sidelying)  This week  With red band  . Pillow between knees   20 reps   And do the Figure 4 stretch after clams   ___   Deep core level 1 and 2 with pillow under hips   Perform quick pelvic floor squeeze after exhale on Deep core level 1 ( breathing) No squeezes with Deep core level 2     ___ Remember quiet breath , no loud sounds  And feel    Practice proper pelvic floor coordination  Inhale: expand pelvic floor muscles Exhale" "j" scoop, allow pelvic floor to close, lift first before belly sinks   ( not "draw abdominal muscle to spine" or strain with abdominal muscles")   __

## 2019-12-18 NOTE — Therapy (Addendum)
[facility] MAIN [facility] SERVICES [address] [place], [place], [address] Phone: [phone]   Fax:  [phone]  Physical Therapy Treatment  Patient Details  Name: Yesenia Davis MRN: [id number] Date of Birth: 09-14-1975 Referring Provider (PT): Serafina Royals    Encounter Date: 12/18/2019   PT End of Session - 12/18/19 1618    Visit Number 4    Number of Visits 10    Date for PT Re-Evaluation 12/25/19    PT Start Time 1604    PT Stop Time 1645    PT Time Calculation (min) 41 min           Past Medical History:  Diagnosis Date  . Acne vulgaris   . Epigastric pain   . Gastritis   . GERD (gastroesophageal reflux disease)   . Gestational diabetes   . Migraines     Past Surgical History:  Procedure Laterality Date  . CHOLECYSTECTOMY  1996  . cyst removed     from neck- b9  . DILATION AND CURETTAGE OF UTERUS      There were no vitals filed for this visit.   Subjective Assessment - 12/18/19 1609    Subjective Pt reported she had cramping / LBP with the coming of her period which lasted for 3 days this month. The LBP was sharp on Saturday. The abdominal pain is not as sharp.    Patient Stated Goals get abdominal / ovary pain down, play with son,              [facility] PT Assessment - 12/18/19 1612      Strength   Overall Strength Comments hip abd L 3+/5, R 4/5                       Pelvic Floor Special Questions - 12/18/19 1650    Prolapse Anterior Wall   stand: limited upward motion w/squeeze cue,within introitus   Pelvic Floor Internal Exam pt contented verbally without contrainidcations     Exam Type Vaginal    Palpation tightness at L urethra compressae/ puborectalis L        Strength weak squeeze, no lift   required pillow under hips for 3/5                          PT Long Term Goals - 12/18/19 1619      PT LONG TERM GOAL #1   Title Pt will be able to sit on hard chair or sit on the  floor for > 15 min and be able to get up easily to play with her son    Time 6    Period Weeks    Status On-going      PT LONG TERM GOAL #2   Title Pt will report improved FOTO score for pelvic pain from 50pts by 10 pts change in order to improve QOL    Time 10    Period Weeks    Status On-going      PT LONG TERM GOAL #3   Title Pt will demo increased hip abduction from 3/5 B to > 4/5 in order improve pelvic girdle stability for functional mobility    Time 8    Period Weeks    Status Partially Met      PT LONG TERM GOAL #4   Title Pt will be IND with deep core coordination and exercises and pelvic floor mm  to improve IAP system and to improve prolapse    Time 4    Period Weeks    Status On-going      PT LONG TERM GOAL #5   Title Pt will demo decreased abdominal separation from 3 fingers width to < 2 fingers width in order to improve IAP system for motility and mobility    Time 6    Period Weeks    Status Achieved      PT LONG TERM GOAL #6   Title Pt will report increased bowel movement frequency from 2-3x week to daily or every other day with no more straining in order to minimize prolapse and improve GI health and pelvic health    Time 10    Period Weeks    Status Achieved                 Plan - 12/18/19 1618    Clinical Impression Statement Pt showed good carry over with less spinal mm tensions and better posture. Pt showed lowered bladder position in standing and limited upward movement of pelvic floor mm. Pt also showed tight L urethra compressae/ puborectalis which decreased post Tx. Pt demo'd improved upward motion of pelvic floor with quick contraction with hips elevated on pillow. Progressed to pelvic floor quick contractions. Pt continues to benefit from skilled PT   Session was abbreviated due to pt reporting her children were left in the car ( 58 and 77 year old) as the COVID-policy at hospital does not allow for children to attend pt's appt with her.       Examination-Activity Limitations Continence    Rehab Potential Good    PT Frequency 1x / week    PT Duration Other (comment)   10   PT Treatment/Interventions Neuromuscular re-education;Stair training;Therapeutic activities;Functional mobility training;Moist Heat;Manual techniques;Patient/family education;Taping;Scar mobilization;Balance training;Energy conservation;Gait training;Therapeutic exercise;Biofeedback;Cryotherapy    Consulted and Agree with Plan of Care Patient           Patient will benefit from skilled therapeutic intervention in order to improve the following deficits and impairments:  Improper body mechanics, Pain, Increased muscle spasms, Decreased mobility, Decreased endurance, Decreased range of motion, Decreased coordination, Decreased balance, Difficulty walking, Decreased safety awareness, Decreased strength, Hypomobility, Hypermobility, Decreased scar mobility  Visit Diagnosis: Sacrococcygeal disorders, not elsewhere classified  Muscle weakness (generalized)     Problem List Patient Active Problem List   Diagnosis Date Noted  . Postpartum depression 11/29/2015  . Gestational diabetes mellitus 10/18/2015    Jerl Mina ,PT, DPT, E-RYT  12/18/2019, 4:59 PM  [place] MAIN [facility] SERVICES [address] [place], [place], [address] Phone: [phone]   Fax:  [phone]  Name: Yesenia Davis MRN: [id number] Date of Birth: 1975/03/28

## 2019-12-25 ENCOUNTER — Ambulatory Visit: Payer: BC Managed Care – PPO | Admitting: Physical Therapy

## 2019-12-25 ENCOUNTER — Other Ambulatory Visit: Payer: Self-pay

## 2019-12-25 DIAGNOSIS — M533 Sacrococcygeal disorders, not elsewhere classified: Secondary | ICD-10-CM | POA: Diagnosis not present

## 2019-12-25 DIAGNOSIS — M6281 Muscle weakness (generalized): Secondary | ICD-10-CM

## 2019-12-25 NOTE — Patient Instructions (Addendum)
°  Youtube  [facility] [place] [facility]  __  Hold off on twists ( open book, and multifidis twist with the band ) until you are start 2nd trimester   Seated version of stretching the back instead of standing   Keep up with clams and deep core level 1 and 2 throughout pregnancy and after  - perform seated version propped by hips on chair  with green band at thigh, make sure to not let knee come inward after pulling out - perform deep core level 1 and 2 seated with hands propped by hips on chair   During 2nd-3rd  trimester, avoid sleeping or lying on your back so do deep core level 1 and 2 in reclined position with pillows propped behind or seated    ___    Body mechanics training during pregnancy and after with baby :  Lifting: Squat or ski-track standing (one foot forward, shift weight into back leg as you lift)  Stand at an angle to keep baby to your center    Breastfeeding: Extra pillow under armpit in addition to boppy pillow Carseat in a chair 45 deg to your feeding chair to place her in after feeding so you can rise out of the chair without weight   Stair-climbing:  45 deg turned to rail, step to   Changing table:  Standing with ski track stance 45 deg to table instead of forward bending.   Bending down into stroller: Standing with ski track stance 45 deg to table instead of forward bending.    Stand without locking knees , equal weight across both legs    Lowering down to floor with baby but lowering forearm and baby to a couch/ ottoman , then  Both knees down together to floor  ___

## 2019-12-26 NOTE — Therapy (Signed)
[place] MAIN [facility] SERVICES [address] [place], [place], [address] Phone: [phone]   Fax:  [phone]  Physical Therapy Treatment / Discharge Summary   Patient Details  Name: Yesenia Davis MRN: [id number] Date of Birth: 13-May-1975 Referring Provider (PT): Dani Gobble    Encounter Date: 12/25/2019   PT End of Session - 12/25/19 1608    Visit Number 5    Number of Visits 10    Date for PT Re-Evaluation 12/25/19    PT Start Time 1600    PT Stop Time 1655    PT Time Calculation (min) 55 min           Past Medical History:  Diagnosis Date  . Acne vulgaris   . Epigastric pain   . Gastritis   . GERD (gastroesophageal reflux disease)   . Gestational diabetes   . Migraines     Past Surgical History:  Procedure Laterality Date  . CHOLECYSTECTOMY  1996  . cyst removed     from neck- b9  . DILATION AND CURETTAGE OF UTERUS      There were no vitals filed for this visit.   Subjective Assessment - 12/25/19 1603    Subjective Pt took a pregnancy test and found it to he positive andhas an appt with Alesia Banda referring provider this Thurs    Patient Stated Goals get abdominal / ovary pain down, play with son,              [facility] PT Assessment - 12/26/19 1359      Strength   Overall Strength Comments hip abd 3+/5      Palpation   Spinal mobility decreased mm tensions    SI assessment  equal pelvic girlde height                         [facility] Adult PT Treatment/Exercise - 12/26/19 1400      Therapeutic Activites    Other Therapeutic Activities education on pregnancy / post partum body mechanics   d/c education     Neuro Re-ed    Neuro Re-ed Details  cued for seated versions to deep core and clams with bands                       PT Long Term Goals - 12/25/19 1621      PT LONG TERM GOAL #1   Title Pt will be able to sit on hard chair or sit on the floor for > 15 min and  be able to get up easily to play with her son    Time 6    Period Weeks    Status Deferred      PT LONG TERM GOAL #2   Title Pt will report improved FOTO score for pelvic pain from 50pts by 10 pts change in order to improve QOL  (12/13: 29 pt change )    Time 10    Period Weeks    Status Achieved      PT LONG TERM GOAL #3   Title Pt will demo increased hip abduction from 3/5 B to > 4/5 in order improve pelvic girdle stability for functional mobility    Time 8    Period Weeks    Status Partially Met      PT LONG TERM GOAL #4   Title Pt will be IND with deep core coordination and exercises and pelvic  floor mm to improve IAP system and to improve prolapse    Time 4    Period Weeks    Status Achieved      PT LONG TERM GOAL #5   Title Pt will demo decreased abdominal separation from 3 fingers width to < 2 fingers width in order to improve IAP system for motility and mobility    Time 6    Period Weeks    Status Achieved      PT LONG TERM GOAL #6   Title Pt will report increased bowel movement frequency from 2-3x week to daily or every other day with no more straining in order to minimize prolapse and improve GI health and pelvic health    Time 10    Period Weeks    Status Achieved                 Plan - 12/25/19 1608    Clinical Impression Statement Pt has achieved 4 out of 5 goals. Pt has improved bowel movements occurring daily or every other day, less abdominal separation, more deep core strength and coordination, less back mm tensions, and pelvic girdle/ spinal alignment.  Pt's FOTO score improved by 29 pts which indicates improved function.  Pt is working to improve hip abduction strength which will help with pelvic girdle stability. Pt will f/u with OB/GYN with a recent positive pregnancy test. Pt was educated on body mechanics to follow throughout pregnancy to minimize injuries and demo'd IND with training.   Pt is ready for d/c at this time.    Examination-Activity  Limitations Continence    Rehab Potential Good    PT Frequency 1x / week    PT Duration Other (comment)   10   PT Treatment/Interventions Neuromuscular re-education;Stair training;Therapeutic activities;Functional mobility training;Moist Heat;Manual techniques;Patient/family education;Taping;Scar mobilization;Balance training;Energy conservation;Gait training;Therapeutic exercise;Biofeedback;Cryotherapy    Consulted and Agree with Plan of Care Patient           Patient will benefit from skilled therapeutic intervention in order to improve the following deficits and impairments:  Improper body mechanics,Pain,Increased muscle spasms,Decreased mobility,Decreased endurance,Decreased range of motion,Decreased coordination,Decreased balance,Difficulty walking,Decreased safety awareness,Decreased strength,Hypomobility,Hypermobility,Decreased scar mobility  Visit Diagnosis: Muscle weakness (generalized)  Sacrococcygeal disorders, not elsewhere classified     Problem List Patient Active Problem List   Diagnosis Date Noted  . Postpartum depression 11/29/2015  . Gestational diabetes mellitus 10/18/2015    Jerl Mina ,PT, DPT, E-RYT  12/26/2019, 4:07 PM  [place] MAIN [facility] SERVICES [address] [place], [place], [address] Phone: [phone]   Fax:  [phone]  Name: Yesenia Davis MRN: [id number] Date of Birth: 27-Feb-1975

## 2019-12-28 ENCOUNTER — Ambulatory Visit (INDEPENDENT_AMBULATORY_CARE_PROVIDER_SITE_OTHER): Payer: BC Managed Care – PPO | Admitting: Certified Nurse Midwife

## 2019-12-28 ENCOUNTER — Encounter: Payer: Self-pay | Admitting: Certified Nurse Midwife

## 2019-12-28 ENCOUNTER — Other Ambulatory Visit: Payer: Self-pay

## 2019-12-28 VITALS — BP 104/73 | HR 72 | Ht 63.0 in | Wt 146.6 lb

## 2019-12-28 DIAGNOSIS — Z3201 Encounter for pregnancy test, result positive: Secondary | ICD-10-CM | POA: Diagnosis not present

## 2019-12-28 DIAGNOSIS — N926 Irregular menstruation, unspecified: Secondary | ICD-10-CM

## 2019-12-28 LAB — POCT URINE PREGNANCY: Preg Test, Ur: POSITIVE — AB

## 2019-12-28 NOTE — Patient Instructions (Signed)
Healthy Weight Gain During Pregnancy, Adult A certain amount of weight gain during pregnancy is normal and healthy. How much weight you should gain depends on your overall health and a measurement called BMI (body mass index). BMI is an estimate of your body fat based on your height and weight. You can use an online calculator to figure out your BMI, or you can ask your health care provider to calculate it for you at your next visit. Your recommended pregnancy weight gain is based on your pre-pregnancy BMI. General guidelines for a healthy total weight gain during pregnancy are listed below. If your BMI at or before the start of your pregnancy is:  Less than 18.5 (underweight), you should gain 28-40 lb (13-18 kg).  18.5-24.9 (normal weight), you should gain 25-35 lb (11-16 kg).  25-29.9 (overweight), you should gain 15-25 lb (7-11 kg).  30 or higher (obese), you should gain 11-20 lb (5-9 kg). These ranges vary depending on your individual health. If you are carrying more than one baby (multiples), it may be safe to gain more weight than these recommendations. If you gain less weight than recommended, that may be safe as long as your baby is growing and developing normally. How can unhealthy weight gain affect me and my baby? Gaining too much weight during pregnancy can lead to pregnancy complications, such as:  A temporary form of diabetes that develops during pregnancy (gestational diabetes).  High blood pressure during pregnancy and protein in your urine (preeclampsia).  High blood pressure during pregnancy without protein in your urine (gestational hypertension).  Your baby having a high weight at birth, which may: ? Raise your risk of having a more difficult delivery or a surgical delivery (cesarean delivery, or C-section). ? Raise your child's risk of developing obesity during childhood. Not gaining enough weight can be life-threatening for your baby, and it may raise your baby's chances  of:  Being born early (preterm).  Growing more slowly than normal during pregnancy (growth restriction).  Having a low weight at birth. What actions can I take to gain a healthy amount of weight during pregnancy? General instructions  Keep track of your weight gain during pregnancy.  Take over-the-counter and prescription medicines only as told by your health care provider. Take all prenatal supplements as directed.  Keep all health care visits during pregnancy (prenatal visits). These visits are a good time to discuss your weight gain. Your health care provider will weigh you at each visit to make sure you are gaining a healthy amount of weight. Nutrition   Eat a balanced, nutrient-rich diet. Eat plenty of: ? Fruits and vegetables, such as berries and broccoli. ? Whole grains, such as millet, barley, whole-wheat breads and cereals, and oatmeal. ? Low-fat dairy products or non-dairy products such as almond milk or rice milk. ? Protein foods, such as lean meat, chicken, eggs, and legumes (such as peas, beans, soybeans, and lentils).  Avoid foods that are fried or have a lot of fat, salt (sodium), or sugar.  Drink enough fluid to keep your urine pale yellow.  Choose healthy snack and drink options when you are at work or on the go: ? Drink water. Avoid soda, sports drinks, and juices that have added sugar. ? Avoid drinks with caffeine, such as coffee and energy drinks. ? Eat snacks that are high in protein, such as nuts, protein bars, and low-fat yogurt. ? Carry convenient snacks in your purse that do not need refrigeration, such as a pack of   trail mix, an apple, or a granola bar.  If you need help improving your diet, work with a health care provider or a diet and nutrition specialist (dietitian). Activity   Exercise regularly, as told by your health care provider. ? If you were active before becoming pregnant, you may be able to continue your regular fitness activities. ? If  you were not active before pregnancy, you may gradually build up to exercising for 30 or more minutes on most days of the week. This may include walking, swimming, or yoga.  Ask your health care provider what activities are safe for you. Talk with your health care provider about whether you may need to be excused from certain school or work activities. Where to find more information Learn more about managing your weight gain during pregnancy from:  American Pregnancy Association: www.americanpregnancy.org  [place] Department of Agriculture pregnancy weight gain calculator: Former[employer] Summary  Too much weight gain during pregnancy can lead to complications for you and your baby.  Find out your pre-pregnancy BMI to determine how much weight gain is healthy for you.  Eat nutritious foods and stay active.  Keep all of your prenatal visits as told by your health care provider. This information is not intended to replace advice given to you by your health care provider. Make sure you discuss any questions you have with your health care provider. Document Revised: 09/21/2018 Document Reviewed: 09/18/2016 Elsevier Patient Education  [place].   Exercise During Pregnancy Exercise is an important part of being healthy for people of all ages. Exercise improves the function of your heart and lungs and helps you maintain strength, flexibility, and a healthy body weight. Exercise also boosts energy levels and elevates mood. Most women should exercise regularly during pregnancy. In rare cases, women with certain medical conditions or complications may be asked to limit or avoid exercise during pregnancy. How does this affect me? Along with maintaining general strength and flexibility, exercising during pregnancy can help:  Keep strength in muscles that are used during labor and childbirth.  Decrease low back pain.  Reduce symptoms of depression.  Control weight gain during  pregnancy.  Reduce the risk of needing insulin if you develop diabetes during pregnancy.  Decrease the risk of cesarean delivery.  Speed up your recovery after giving birth. How does this affect my baby? Exercise can help you have a healthy pregnancy. Exercise does not cause premature birth. It will not cause your baby to weigh less at birth. What exercises can I do? Many exercises are safe for you to do during pregnancy. Do a variety of exercises that safely increase your heart and breathing rates and help you build and maintain muscle strength. Do exercises exactly as told by your health care provider. You may do these exercises:  Walking or hiking.  Swimming.  Water aerobics.  Riding a stationary bike.  Strength training.  Modified yoga or Pilates. Tell your instructor that you are pregnant. Avoid overstretching, and avoid lying on your back for long periods of time.  Running or jogging. Only choose this type of exercise if you: ? Ran or jogged regularly before your pregnancy. ? Can run or jog and still talk in complete sentences. What exercises should I avoid? Depending on your level of fitness and whether you exercised regularly before your pregnancy, you may be told to limit high-intensity exercise. You can tell that you are exercising at a high intensity if you are breathing much harder and faster and  cannot hold a conversation while exercising. You must avoid:  Contact sports.  Activities that put you at risk for falling on or being hit in the belly, such as downhill skiing, water skiing, surfing, rock climbing, cycling, gymnastics, and horseback riding.  Scuba diving.  Skydiving.  Yoga or Pilates in a room that is heated to high temperatures.  Jogging or running, unless you ran or jogged regularly before your pregnancy. While jogging or running, you should always be able to talk in full sentences. Do not run or jog so fast that you are unable to have a  conversation.  Do not exercise at more than 6,000 feet above sea level (high elevation) if you are not used to exercising at high elevation. How do I exercise in a safe way?   Avoid overheating. Do not exercise in very high temperatures.  Wear loose-fitting, breathable clothes.  Avoid dehydration. Drink enough water before, during, and after exercise to keep your urine pale yellow.  Avoid overstretching. Because of hormone changes during pregnancy, it is easy to overstretch muscles, tendons, and ligaments during pregnancy.  Start slowly and ask your health care provider to recommend the types of exercise that are safe for you.  Do not exercise to lose weight. Follow these instructions at home:  Exercise on most days or all days of the week. Try to exercise for 30 minutes a day, 5 days a week, unless your health care provider tells you not to.  If you actively exercised before your pregnancy and you are healthy, your health care provider may tell you to continue to do moderate to high-intensity exercise.  If you are just starting to exercise or did not exercise much before your pregnancy, your health care provider may tell you to do low to moderate-intensity exercise. Questions to ask your health care provider  Is exercise safe for me?  What are signs that I should stop exercising?  Does my health condition mean that I should not exercise during pregnancy?  When should I avoid exercising during pregnancy? Stop exercising and contact a health care provider if: You have any unusual symptoms, such as:  Mild contractions of the uterus or cramps in the abdomen.  Dizziness that does not go away when you rest. Stop exercising and get help right away if: You have any unusual symptoms, such as:  Sudden, severe pain in your low back or your belly.  Mild contractions of the uterus or cramps in the abdomen that do not improve with rest and drinking fluids.  Chest pain.  Bleeding or  fluid leaking from your vagina.  Shortness of breath. These symptoms may represent a serious problem that is an emergency. Do not wait to see if the symptoms will go away. Get medical help right away. Call your local emergency services (911 in the [place] Do not drive yourself to the hospital. Summary  Most women should exercise regularly throughout pregnancy. In rare cases, women with certain medical conditions or complications may be asked to limit or avoid exercise during pregnancy.  Do not exercise to lose weight during pregnancy.  Your health care provider will tell you what level of physical activity is right for you.  Stop exercising and contact a health care provider if you have mild contractions of the uterus or cramps in the abdomen. Get help right away if these contractions or cramps do not improve with rest and drinking fluids.  Stop exercising and get help right away if you have sudden, severe  pain in your low back or belly, chest pain, shortness of breath, or bleeding or leaking of fluid from your vagina. This information is not intended to replace advice given to you by your health care provider. Make sure you discuss any questions you have with your health care provider. Document Revised: 04/21/2018 Document Reviewed: 02/02/2018 Elsevier Patient Education  2020 Elsevier Inc.   Common Medications Safe in Pregnancy  Acne:      Constipation:  Benzoyl Peroxide     Colace  Clindamycin      Dulcolax Suppository  Topica Erythromycin     Fibercon  Salicylic Acid      Metamucil         Miralax AVOID:        Senakot   Accutane    Cough:  Retin-A       Cough Drops  Tetracycline      Phenergan w/ Codeine if Rx  Minocycline      Robitussin (Plain & DM)  Antibiotics:     Crabs/Lice:  Ceclor       RID  Cephalosporins    AVOID:  E-Mycins      Kwell  Keflex  Macrobid/Macrodantin   Diarrhea:  Penicillin      Kao-Pectate  Zithromax      Imodium AD         PUSH  FLUIDS AVOID:       Cipro     Fever:  Tetracycline      Tylenol (Regular or Extra  Minocycline       Strength)  Levaquin      Extra Strength-Do not          Exceed 8 tabs/24 hrs Caffeine:        <200mg/day (equiv. To 1 cup of coffee or  approx. 3 12 oz sodas)         Gas: Cold/Hayfever:       Gas-X  Benadryl      Mylicon  Claritin       Phazyme  **Claritin-D        Chlor-Trimeton    Headaches:  Dimetapp      ASA-Free Excedrin  Drixoral-Non-Drowsy     Cold Compress  Mucinex (Guaifenasin)     Tylenol (Regular or Extra  Sudafed/Sudafed-12 Hour     Strength)  **Sudafed PE Pseudoephedrine   Tylenol Cold & Sinus     Vicks Vapor Rub  Zyrtec  **AVOID if Problems With Blood Pressure         Heartburn: Avoid lying down for at least 1 hour after meals  Aciphex      Maalox     Rash:  Milk of Magnesia     Benadryl    Mylanta       1% Hydrocortisone Cream  Pepcid  Pepcid Complete   Sleep Aids:  Prevacid      Ambien   Prilosec       Benadryl  Rolaids       Chamomile Tea  Tums (Limit 4/day)     Unisom         Tylenol PM         Warm milk-add vanilla or  Hemorrhoids:       Sugar for taste  Anusol/Anusol H.C.  (RX: Analapram 2.5%)  Sugar Substitutes:  Hydrocortisone OTC     Ok in moderation  Preparation H      Tucks        Vaseline lotion applied to tissue with wiping      Herpes:     Throat:  Acyclovir      Oragel  Famvir  Valtrex     Vaccines:         Flu Shot Leg Cramps:       *Gardasil  Benadryl      Hepatitis A         Hepatitis B Nasal Spray:       Pneumovax  Saline Nasal Spray     Polio Booster         Tetanus Nausea:       Tuberculosis test or PPD  Vitamin B6 25 mg TID   AVOID:    Dramamine      *Gardasil  Emetrol       Live Poliovirus  Ginger Root 250 mg QID    MMR (measles, mumps &  High Complex Carbs @ Bedtime    rebella)  Sea Bands-Accupressure    Varicella (Chickenpox)  Unisom 1/2 tab TID     *No known complications           If received  before Pain:         Known pregnancy;   Darvocet       Resume series after  Lortab        Delivery  Percocet    Yeast:   Tramadol      Femstat  Tylenol 3      Gyne-lotrimin  Ultram       Monistat  Vicodin           MISC:         All Sunscreens           Hair Coloring/highlights          Insect Repellant's          (Including DEET)         Mystic Tans   First Trimester of Pregnancy  The first trimester of pregnancy is from week 1 until the end of week 13 (months 1 through 3). During this time, your baby will begin to develop inside you. At 6-8 weeks, the eyes and face are formed, and the heartbeat can be seen on ultrasound. At the end of 12 weeks, all the baby's organs are formed. Prenatal care is all the medical care you receive before the birth of your baby. Make sure you get good prenatal care and follow all of your doctor's instructions. Follow these instructions at home: Medicines  Take over-the-counter and prescription medicines only as told by your doctor. Some medicines are safe and some medicines are not safe during pregnancy.  Take a prenatal vitamin that contains at least 600 micrograms (mcg) of folic acid.  If you have trouble pooping (constipation), take medicine that will make your stool soft (stool softener) if your doctor approves. Eating and drinking   Eat regular, healthy meals.  Your doctor will tell you the amount of weight gain that is right for you.  Avoid raw meat and uncooked cheese.  If you feel sick to your stomach (nauseous) or throw up (vomit): ? Eat 4 or 5 small meals a day instead of 3 large meals. ? Try eating a few soda crackers. ? Drink liquids between meals instead of during meals.  To prevent constipation: ? Eat foods that are high in fiber, like fresh fruits and vegetables, whole grains, and beans. ? Drink enough fluids to keep your pee (urine) clear or pale yellow. Activity  Exercise only as told by your doctor. Stop exercising  if you  have cramps or pain in your lower belly (abdomen) or low back.  Do not exercise if it is too hot, too humid, or if you are in a place of great height (high altitude).  Try to avoid standing for long periods of time. Move your legs often if you must stand in one place for a long time.  Avoid heavy lifting.  Wear low-heeled shoes. Sit and stand up straight.  You can have sex unless your doctor tells you not to. Relieving pain and discomfort  Wear a good support bra if your breasts are sore.  Take warm water baths (sitz baths) to soothe pain or discomfort caused by hemorrhoids. Use hemorrhoid cream if your doctor says it is okay.  Rest with your legs raised if you have leg cramps or low back pain.  If you have puffy, bulging veins (varicose veins) in your legs: ? Wear support hose or compression stockings as told by your doctor. ? Raise (elevate) your feet for 15 minutes, 3-4 times a day. ? Limit salt in your food. Prenatal care  Schedule your prenatal visits by the twelfth week of pregnancy.  Write down your questions. Take them to your prenatal visits.  Keep all your prenatal visits as told by your doctor. This is important. Safety  Wear your seat belt at all times when driving.  Make a list of emergency phone numbers. The list should include numbers for family, friends, the hospital, and police and fire departments. General instructions  Ask your doctor for a referral to a local prenatal class. Begin classes no later than at the start of month 6 of your pregnancy.  Ask for help if you need counseling or if you need help with nutrition. Your doctor can give you advice or tell you where to go for help.  Do not use hot tubs, steam rooms, or saunas.  Do not douche or use tampons or scented sanitary pads.  Do not cross your legs for long periods of time.  Avoid all herbs and alcohol. Avoid drugs that are not approved by your doctor.  Do not use any tobacco products,  including cigarettes, chewing tobacco, and electronic cigarettes. If you need help quitting, ask your doctor. You may get counseling or other support to help you quit.  Avoid cat litter boxes and soil used by cats. These carry germs that can cause birth defects in the baby and can cause a loss of your baby (miscarriage) or stillbirth.  Visit your dentist. At home, brush your teeth with a soft toothbrush. Be gentle when you floss. Contact a doctor if:  You are dizzy.  You have mild cramps or pressure in your lower belly.  You have a nagging pain in your belly area.  You continue to feel sick to your stomach, you throw up, or you have watery poop (diarrhea).  You have a bad smelling fluid coming from your vagina.  You have pain when you pee (urinate).  You have increased puffiness (swelling) in your face, hands, legs, or ankles. Get help right away if:  You have a fever.  You are leaking fluid from your vagina.  You have spotting or bleeding from your vagina.  You have very bad belly cramping or pain.  You gain or lose weight rapidly.  You throw up blood. It may look like coffee grounds.  You are around people who have German measles, fifth disease, or chickenpox.  You have a very bad headache.  You   have shortness of breath.  You have any kind of trauma, such as from a fall or a car accident. Summary  The first trimester of pregnancy is from week 1 until the end of week 13 (months 1 through 3).  To take care of yourself and your unborn baby, you will need to eat healthy meals, take medicines only if your doctor tells you to do so, and do activities that are safe for you and your baby.  Keep all follow-up visits as told by your doctor. This is important as your doctor will have to ensure that your baby is healthy and growing well. This information is not intended to replace advice given to you by your health care provider. Make sure you discuss any questions you have with  your health care provider. Document Revised: 04/21/2018 Document Reviewed: 01/07/2016 Elsevier Patient Education  2020 Elsevier Inc.  

## 2019-12-28 NOTE — Progress Notes (Signed)
GYN ENCOUNTER NOTE  Subjective:       Yesenia Davis is a 44 y.o. [phone] female here for pregnancy confirmation.   Reports positive home pregnancy test.   Endorses intermittent abdominal cramping, fatigue, headaches, nausea without vomiting and lightheadedness.   History significant for vertigo and gestational diabetes in last pregnancy.   Denies difficulty breathing or respiratory distress, chest pain, vaginal bleeding, dysuria, and leg pain or swelling.    Gynecologic History  Patient's last menstrual period was 11/13/2019.   Gestational age: 57 weeks 3 days  Estimated date of birth: 08/19/2020  Contraception: none.   Last Pap: 01/2018. Results were: Neg/Neg  Last mammogram: 03/2019. Results were: BI-RADS 1  Obstetric History  OB History  Gravida Para Term Preterm AB Living  4 3 3   1 3   SAB IAB Ectopic Multiple Live Births  1     0 3    # Outcome Date GA Lbr Len/2nd Weight Sex Delivery Anes PTL Lv  4 Term 10/18/15 [redacted]w[redacted]d / 00:06 6 lb 14.1 oz (3.12 kg) M Vag-Spont None  LIV  3 Term 2005   7 lb 1.8 oz (3.225 kg) M Vag-Spont   LIV  2 SAB 2003          1 Term 1996   7 lb 3.2 oz (3.266 kg) F Vag-Spont   LIV    Past Medical History:  Diagnosis Date  . Acne vulgaris   . Epigastric pain   . Gastritis   . GERD (gastroesophageal reflux disease)   . Gestational diabetes   . Migraines     Past Surgical History:  Procedure Laterality Date  . CHOLECYSTECTOMY  1996  . cyst removed     from neck- b9  . DILATION AND CURETTAGE OF UTERUS      Current Outpatient Medications on File Prior to Visit  Medication Sig Dispense Refill  . Prenatal-DSS-FeCb-FeGl-FA (CITRANATAL BLOOM) 90-1 MG TABS Take 1 tablet by mouth daily. 30 tablet 11  . VITAMIN D-1000 MAX ST 25 MCG (1000 UT) tablet Take 1,000 Units by mouth daily.     No current facility-administered medications on file prior to visit.    Allergies  Allergen Reactions  . Ciprofloxacin Hcl Rash    Social History    Socioeconomic History  . Marital status: Single    Spouse name: Not on file  . Number of children: Not on file  . Years of education: Not on file  . Highest education level: Not on file  Occupational History  . Not on file  Tobacco Use  . Smoking status: Never Smoker  . Smokeless tobacco: Never Used  Vaping Use  . Vaping Use: Never used  Substance and Sexual Activity  . Alcohol use: No  . Drug use: No  . Sexual activity: Yes  Other Topics Concern  . Not on file  Social History Narrative  . Not on file   Social Determinants of Health   Financial Resource Strain: Not on file  Food Insecurity: Not on file  Transportation Needs: Not on file  Physical Activity: Not on file  Stress: Not on file  Social Connections: Not on file  Intimate Partner Violence: Not on file    Family History  Problem Relation Age of Onset  . Breast cancer Maternal Aunt        40's  . Diabetes Father   . Diabetes Mother   . Ovarian cancer Neg Hx   . Colon cancer Neg Hx   .  Heart disease Neg Hx     The following portions of the patient's history were reviewed and updated as appropriate: allergies, current medications, past family history, past medical history, past social history, past surgical history and problem list.  Review of Systems  ROS negative except as noted above. Information obtained from patient.   Objective:   BP 104/73   Pulse 72   Ht 5\' 3"  (1.6 m)   Wt 146 lb 9.6 oz (66.5 kg)   LMP 11/13/2019   BMI 25.97 kg/m    CONSTITUTIONAL: Well-developed, well-nourished female in no acute distress.   PHYSICAL EXAM: Not indicated.   Recent Results (from the past 2160 hour(s))  POCT urine pregnancy     Status: Abnormal   Collection Time: 12/28/19 10:06 AM  Result Value Ref Range   Preg Test, Ur Positive (A) Negative    Assessment:   1. Missed menses  - POCT urine pregnancy - 12/30/19 OB LESS THAN 14 WEEKS WITH OB TRANSVAGINAL; Future  2. Positive pregnancy test  - US OB  LESS THAN 14 WEEKS WITH OB TRANSVAGINAL; Future   Plan:   First trimester education, see AVS.   Reviewed red flag symptoms and when to call.   RTC x 2-3 wks for dating/viability & intake.   RTC x 5-6 wks for NOB PE or sooner if needed.    [place], CNM [facility], [facility] 12/28/19 1:29 PM

## 2019-12-28 NOTE — Progress Notes (Signed)
Pt present for pregnancy confirmation. UPT; Positive

## 2020-01-09 ENCOUNTER — Encounter: Payer: BC Managed Care – PPO | Admitting: Physical Therapy

## 2020-01-13 NOTE — L&D Delivery Note (Signed)
       Delivery Note   Yesenia Davis is a 45 y.o. [id number] at [redacted]w[redacted]d Estimated Date of Delivery: 08/23/20  PRE-OPERATIVE DIAGNOSIS:  1) [redacted]w[redacted]d pregnancy. Gestational diabetic controled with oral medications   POST-OPERATIVE DIAGNOSIS:  1) [redacted]w[redacted]d pregnancy s/p Vaginal, Spontaneous , GDM on medication   Delivery Type: Vaginal, Spontaneous    Delivery Anesthesia:  none  Labor Complications:  none    ESTIMATED BLOOD LOSS: 250 ml    FINDINGS:   1) female infant, Apgar scores of   8 at 1 minute and   9 at 5 minutes and birth weight is pending. Infant remains skin to skin.   2) Nuchal cord: none  SPECIMENS:   PLACENTA:   Appearance: Intact , 3 vessel cord   Removal: Spontaneous      Disposition:  per protocol  DISPOSITION:  Infant to left in stable condition in the delivery room, with L&D personnel and mother,  NARRATIVE SUMMARY: Labor course:  [name] is a [id number] at [redacted]w[redacted]d who presented for induction of labor for GDM controled with glyburide. She progressed well in labor with pitocin.  She received no anesthesia and proceeded to complete dilation. She evidenced good maternal expulsive effort during the second stage. She went on to deliver a viable female infant Yesenia Davis". The placenta delivered without problems and was noted to be complete. A perineal and vaginal examination was performed. Lacerations: Periurethral , bilateral abrasions. Hemostatic no repair needed. The patient tolerated this well. She plains for BTL. Dr. Logan Bores notified.   Doreene Burke, CNM  08/20/2020 2:49 PM

## 2020-01-15 ENCOUNTER — Encounter: Payer: BC Managed Care – PPO | Admitting: Physical Therapy

## 2020-01-16 ENCOUNTER — Encounter: Payer: BC Managed Care – PPO | Admitting: Physical Therapy

## 2020-01-17 ENCOUNTER — Telehealth: Payer: Self-pay

## 2020-01-17 ENCOUNTER — Other Ambulatory Visit: Payer: BC Managed Care – PPO

## 2020-01-17 NOTE — Telephone Encounter (Signed)
See last message

## 2020-01-17 NOTE — Telephone Encounter (Signed)
mychart message sent to patient

## 2020-01-17 NOTE — Telephone Encounter (Signed)
Pt called in and stated that she hasnt been feeling well. The pt said that she had a fever and that she took a at home test and it came back positive. The pt said that she has a cough and that she has some stomach pain and she thinks its from coughing. The pt stated that she has been vaccinated. I told her that we will cancel her appointment for today and a nurse will be in touch with her. Please advise    Unsure of who to send too due to [facility] not having nurse.

## 2020-01-18 ENCOUNTER — Other Ambulatory Visit: Payer: BC Managed Care – PPO

## 2020-02-01 ENCOUNTER — Encounter: Payer: BC Managed Care – PPO | Admitting: Physical Therapy

## 2020-02-08 ENCOUNTER — Ambulatory Visit (INDEPENDENT_AMBULATORY_CARE_PROVIDER_SITE_OTHER): Payer: BC Managed Care – PPO | Admitting: Certified Nurse Midwife

## 2020-02-08 ENCOUNTER — Encounter: Payer: Self-pay | Admitting: Certified Nurse Midwife

## 2020-02-08 ENCOUNTER — Other Ambulatory Visit: Payer: BC Managed Care – PPO

## 2020-02-08 ENCOUNTER — Encounter: Payer: BC Managed Care – PPO | Admitting: Physical Therapy

## 2020-02-08 ENCOUNTER — Other Ambulatory Visit: Payer: Self-pay

## 2020-02-08 VITALS — BP 108/60 | HR 72 | Wt 148.0 lb

## 2020-02-08 DIAGNOSIS — O09521 Supervision of elderly multigravida, first trimester: Secondary | ICD-10-CM

## 2020-02-08 DIAGNOSIS — Z8616 Personal history of COVID-19: Secondary | ICD-10-CM

## 2020-02-08 DIAGNOSIS — Z8632 Personal history of gestational diabetes: Secondary | ICD-10-CM

## 2020-02-08 DIAGNOSIS — Z3A12 12 weeks gestation of pregnancy: Secondary | ICD-10-CM

## 2020-02-08 DIAGNOSIS — Z1379 Encounter for other screening for genetic and chromosomal anomalies: Secondary | ICD-10-CM

## 2020-02-08 DIAGNOSIS — O0991 Supervision of high risk pregnancy, unspecified, first trimester: Secondary | ICD-10-CM

## 2020-02-08 LAB — OB RESULTS CONSOLE VARICELLA ZOSTER ANTIBODY, IGG: Varicella: IMMUNE

## 2020-02-08 LAB — OB RESULTS CONSOLE GC/CHLAMYDIA: Gonorrhea: NEGATIVE

## 2020-02-08 MED ORDER — ASPIRIN EC 81 MG PO TBEC: 81.0000 mg | DELAYED_RELEASE_TABLET | Freq: Every day | ORAL | 2 refills | Status: DC

## 2020-02-08 NOTE — Progress Notes (Signed)
I have seen, interviewed, and examined the patient in conjunction with the [employer] and affirm the diagnosis and management plan.   Gunnar Bulla, CNM [facility], [facility] 02/08/20 11:15 AM

## 2020-02-08 NOTE — Progress Notes (Signed)
NEW OB HISTORY AND PHYSICAL  SUBJECTIVE:       Yesenia Davis is a 45 y.o. [phone] female, Patient's last menstrual period was 11/13/2019., Estimated Date of Delivery: 08/19/20, [redacted]w[redacted]d, presents today for establishment of Prenatal Care.  Endorses nausea without vomiting and occasional back pain.    Denies breathing difficulty, respiratory distress, chest pain, abdominal pain, vaginal bleeding, and leg swelling or pain.   Gynecologic History  Patient's last menstrual period was 11/13/2019.   Contraception: none  Last Pap: 02/09/2018. Results were: Neg/Neg  Obstetric History  OB History  Gravida Para Term Preterm AB Living  5 3 3   1 3   SAB IAB Ectopic Multiple Live Births  1     0 3    # Outcome Date GA Lbr Len/2nd Weight Sex Delivery Anes PTL Lv  5 Current           4 Term 10/18/15 104w6d / 00:06 3120 g M Vag-Spont None  LIV  3 Term 2005   3225 g M Vag-Spont   LIV  2 SAB 2003          1 Term 1996   3266 g F Vag-Spont   LIV    Past Medical History:  Diagnosis Date  . Acne vulgaris   . Epigastric pain   . Gastritis   . GERD (gastroesophageal reflux disease)   . Gestational diabetes   . Migraines     Past Surgical History:  Procedure Laterality Date  . CHOLECYSTECTOMY  1996  . cyst removed     from neck- b9  . DILATION AND CURETTAGE OF UTERUS      Current Outpatient Medications on File Prior to Visit  Medication Sig Dispense Refill  . Prenatal-DSS-FeCb-FeGl-FA (CITRANATAL BLOOM) 90-1 MG TABS Take 1 tablet by mouth daily. 30 tablet 11  . VITAMIN D-1000 MAX ST 25 MCG (1000 UT) tablet Take 1,000 Units by mouth daily.     No current facility-administered medications on file prior to visit.    Allergies  Allergen Reactions  . Ciprofloxacin Hcl Rash    Social History   Socioeconomic History  . Marital status: Single    Spouse name: Not on file  . Number of children: Not on file  . Years of education: Not on file  . Highest education level: Not on file   Occupational History  . Not on file  Tobacco Use  . Smoking status: Never Smoker  . Smokeless tobacco: Never Used  Vaping Use  . Vaping Use: Never used  Substance and Sexual Activity  . Alcohol use: No  . Drug use: No  . Sexual activity: Yes    Birth control/protection: None  Other Topics Concern  . Not on file  Social History Narrative  . Not on file   Social Determinants of Health   Financial Resource Strain: Not on file  Food Insecurity: Not on file  Transportation Needs: Not on file  Physical Activity: Not on file  Stress: Not on file  Social Connections: Not on file  Intimate Partner Violence: Not on file    Family History  Problem Relation Age of Onset  . Breast cancer Maternal Aunt        40's  . Diabetes Father   . Diabetes Mother   . Ovarian cancer Neg Hx   . Colon cancer Neg Hx   . Heart disease Neg Hx     The following portions of the patient's history were reviewed and updated as appropriate: allergies,  current medications, past OB history, past medical history, past surgical history, past family history, past social history, and problem list.  Review Of Systems:  ROS- Negative other than what was reported above. Information obtained verbally from patient.  OBJECTIVE:  BP 108/60   Pulse 72   Wt 148 lb (67.1 kg)   LMP 11/13/2019   BMI 26.22 kg/m   Initial Physical Exam (New OB)  GENERAL APPEARANCE: alert, well appearing, in no apparent distress, oriented to person, place and time  HEAD: normocephalic, atraumatic  BREASTS: no masses noted, no significant tenderness, no palpable axillary nodes, no skin changes, patient declined exam  LUNGS: clear to auscultation, no wheezes, rales or rhonchi, symmetric air entry  HEART: regular rate and rhythm, no murmurs  ABDOMEN: soft, nontender, nondistended, no abnormal masses, no epigastric pain and fundus soft, nontender 12 weeks size  EXTREMITIES: no redness or tenderness in the calves or thighs,  no edema  SKIN: normal coloration and turgor, no rashes  NEUROLOGIC: alert, oriented, normal speech, no focal findings or movement disorder noted  PELVIC EXAM: Patient declined exam  ASSESSMENT:  Multigravida of advanced maternal age  Supervision of high risk pregnancy  12 weeks of pregnancy  History of Gestational Diabetes  History of COVID  Genetic screening  PLAN:  Prenatal care  Intake labs and genetic labs today, See orders  Anticipatory guidance regarding course of prenatal care.  Reviewed red flag symptoms and when to call.  RTC as previously scheduled or sooner if needed.   Juliann Pares, Student-MidWife [employer] 02/08/20 10:39 AM

## 2020-02-08 NOTE — Patient Instructions (Signed)
Round Ligament Pain  The round ligament is a cord of muscle and tissue that helps support the uterus. It can become a source of pain during pregnancy if it becomes stretched or twisted as the baby grows. The pain usually begins in the second trimester (13-28 weeks) of pregnancy, and it can come and go until the baby is delivered. It is not a serious problem, and it does not cause harm to the baby. Round ligament pain is usually a short, sharp, and pinching pain, but it can also be a dull, lingering, and aching pain. The pain is felt in the lower side of the abdomen or in the groin. It usually starts deep in the groin and moves up to the outside of the hip area. The pain may occur when you:  Suddenly change position, such as quickly going from a sitting to standing position.  Roll over in bed.  Cough or sneeze.  Do physical activity. Follow these instructions at home:  Watch your condition for any changes.  When the pain starts, relax. Then try any of these methods to help with the pain: ? Sitting down. ? Flexing your knees up to your abdomen. ? Lying on your side with one pillow under your abdomen and another pillow between your legs. ? Sitting in a warm bath for 15-20 minutes or until the pain goes away.  Take over-the-counter and prescription medicines only as told by your health care provider.  Move slowly when you sit down or stand up.  Avoid long walks if they cause pain.  Stop or reduce your physical activities if they cause pain.  Keep all follow-up visits as told by your health care provider. This is important.   Contact a health care provider if:  Your pain does not go away with treatment.  You feel pain in your back that you did not have before.  Your medicine is not helping. Get help right away if:  You have a fever or chills.  You develop uterine contractions.  You have vaginal bleeding.  You have nausea or vomiting.  You have diarrhea.  You have pain  when you urinate. Summary  Round ligament pain is felt in the lower abdomen or groin. It is usually a short, sharp, and pinching pain. It can also be a dull, lingering, and aching pain.  This pain usually begins in the second trimester (13-28 weeks). It occurs because the uterus is stretching with the growing baby, and it is not harmful to the baby.  You may notice the pain when you suddenly change position, when you cough or sneeze, or during physical activity.  Relaxing, flexing your knees to your abdomen, lying on one side, or taking a warm bath may help to get rid of the pain.  Get help from your health care provider if the pain does not go away or if you have vaginal bleeding, nausea, vomiting, diarrhea, or painful urination. This information is not intended to replace advice given to you by your health care provider. Make sure you discuss any questions you have with your health care provider. Document Revised: 06/16/2017 Document Reviewed: 06/16/2017 Elsevier Patient Education  2021 Elsevier Inc.     Back Pain in Pregnancy Back pain during pregnancy is common. Back pain may be caused by several factors that are related to changes during your pregnancy. Follow these instructions at home: Managing pain, stiffness, and swelling  If directed, for sudden (acute) back pain, put ice on the painful area. ?  Put ice in a plastic bag. ? Place a towel between your skin and the bag. ? Leave the ice on for 20 minutes, 2-3 times per day.  If directed, apply heat to the affected area before you exercise. Use the heat source that your health care provider recommends, such as a moist heat pack or a heating pad. ? Place a towel between your skin and the heat source. ? Leave the heat on for 20-30 minutes. ? Remove the heat if your skin turns bright red. This is especially important if you are unable to feel pain, heat, or cold. You may have a greater risk of getting burned.  If directed, massage  the affected area.      Activity  Exercise as told by your health care provider. Gentle exercise is the best way to prevent or manage back pain.  Listen to your body when lifting. If lifting hurts, ask for help or bend your knees. This uses your leg muscles instead of your back muscles.  Squat down when picking up something from the floor. Do not bend over.  Only use bed rest for short periods as told by your health care provider. Bed rest should only be used for the most severe episodes of back pain. Standing, sitting, and lying down  Do not stand in one place for long periods of time.  Use good posture when sitting. Make sure your head rests over your shoulders and is not hanging forward. Use a pillow on your lower back if necessary.  Try sleeping on your side, preferably the left side, with a pregnancy support pillow or 1-2 regular pillows between your legs. ? If you have back pain after a night's rest, your bed may be too soft. ? A firm mattress may provide more support for your back during pregnancy. General instructions  Do not wear high heels.  Eat a healthy diet. Try to gain weight within your health care provider's recommendations.  Use a maternity girdle, elastic sling, or back brace as told by your health care provider.  Take over-the-counter and prescription medicines only as told by your health care provider.  Work with a physical therapist or massage therapist to find ways to manage back pain. Acupuncture or massage therapy may be helpful.  Keep all follow-up visits as told by your health care provider. This is important. Contact a health care provider if:  Your back pain interferes with your daily activities.  You have increasing pain in other parts of your body. Get help right away if:  You develop numbness, tingling, weakness, or problems with the use of your arms or legs.  You develop severe back pain that is not controlled with medicine.  You have a  change in bowel or bladder control.  You develop shortness of breath, dizziness, or you faint.  You develop nausea, vomiting, or sweating.  You have back pain that is a rhythmic, cramping pain similar to labor pains. Labor pain is usually 1-2 minutes apart, lasts for about 1 minute, and involves a bearing down feeling or pressure in your pelvis.  You have back pain and your water breaks or you have vaginal bleeding.  You have back pain or numbness that travels down your leg.  Your back pain developed after you fell.  You develop pain on one side of your back.  You see blood in your urine.  You develop skin blisters in the area of your back pain. Summary  Back pain may be caused  by several factors that are related to changes during your pregnancy.  Follow instructions as told by your health care provider for managing pain, stiffness, and swelling.  Exercise as told by your health care provider. Gentle exercise is the best way to prevent or manage back pain.  Take over-the-counter and prescription medicines only as told by your health care provider.  Keep all follow-up visits as told by your health care provider. This is important. This information is not intended to replace advice given to you by your health care provider. Make sure you discuss any questions you have with your health care provider. Document Revised: 04/19/2018 Document Reviewed: 06/16/2017 Elsevier Patient Education  [place].     Common Medications Safe in Pregnancy  Acne:      Constipation:  Benzoyl Peroxide     Colace  Clindamycin      Dulcolax Suppository  Topica Erythromycin     Fibercon  Salicylic Acid      Metamucil         Miralax AVOID:        Senakot   Accutane    Cough:  Retin-A       Cough Drops  Tetracycline      Phenergan w/ Codeine if Rx  Minocycline      Robitussin (Plain &  DM)  Antibiotics:     Crabs/Lice:  Ceclor       RID  Cephalosporins    AVOID:  E-Mycins      Kwell  Keflex  Macrobid/Macrodantin   Diarrhea:  Penicillin      Kao-Pectate  Zithromax      Imodium AD         PUSH FLUIDS AVOID:       Cipro     Fever:  Tetracycline      Tylenol (Regular or Extra  Minocycline       Strength)  Levaquin      Extra Strength-Do not          Exceed 8 tabs/24 hrs Caffeine:        <256m/day (equiv. To 1 cup of coffee or  approx. 3 12 oz sodas)         Gas: Cold/Hayfever:       Gas-X  Benadryl      Mylicon  Claritin       Phazyme  **Claritin-D        Chlor-Trimeton    Headaches:  Dimetapp      ASA-Free Excedrin  Drixoral-Non-Drowsy     Cold Compress  Mucinex (Guaifenasin)     Tylenol (Regular or Extra  Sudafed/Sudafed-12 Hour     Strength)  **Sudafed PE Pseudoephedrine   Tylenol Cold & Sinus     Vicks Vapor Rub  Zyrtec  **AVOID if Problems With Blood Pressure         Heartburn: Avoid lying down for at least 1 hour after meals  Aciphex      Maalox     Rash:  Milk of Magnesia     Benadryl    Mylanta       1% Hydrocortisone Cream  Pepcid  Pepcid Complete   Sleep Aids:  Prevacid      Ambien   Prilosec       Benadryl  Rolaids       Chamomile Tea  Tums (Limit 4/day)     Unisom         Tylenol PM         Warm  milk-add vanilla or  Hemorrhoids:       Sugar for taste  Anusol/Anusol H.C.  (RX: Analapram 2.5%)  Sugar Substitutes:  Hydrocortisone OTC     Ok in moderation  Preparation H      Tucks        Vaseline lotion applied to tissue with wiping    Herpes:     Throat:  Acyclovir      Oragel  Famvir  Valtrex     Vaccines:         Flu Shot Leg Cramps:       *Gardasil  Benadryl      Hepatitis A         Hepatitis B Nasal Spray:       Pneumovax  Saline Nasal Spray     Polio Booster         Tetanus Nausea:       Tuberculosis test or PPD  Vitamin B6 25 mg TID   AVOID:    Dramamine      *Gardasil  Emetrol       Live Poliovirus  Ginger  Root 250 mg QID    MMR (measles, mumps &  High Complex Carbs @ Bedtime    rebella)  Sea Bands-Accupressure    Varicella (Chickenpox)  Unisom 1/2 tab TID     *No known complications           If received before Pain:         Known pregnancy;   Darvocet       Resume series after  Lortab        Delivery  Percocet    Yeast:   Tramadol      Femstat  Tylenol 3      Gyne-lotrimin  Ultram       Monistat  Vicodin           MISC:         All Sunscreens           Hair Coloring/highlights          Insect Repellant's          (Including DEET)         Mystic Tans     Second Trimester of Pregnancy  The second trimester of pregnancy is from week 13 through week 27. This is also called months 4 through 6 of pregnancy. This is often the time when you feel your best. During the second trimester:  Morning sickness is less or has stopped.  You may have more energy.  You may feel hungry more often. At this time, your unborn baby (fetus) is growing very fast. At the end of the sixth month, the unborn baby may be up to 12 inches long and weigh about 1 pounds. You will likely start to feel the baby move between 16 and 20 weeks of pregnancy. Body changes during your second trimester Your body continues to go through many changes during this time. The changes vary and generally return to normal after the baby is born. Physical changes  You will gain more weight.  You may start to get stretch marks on your hips, belly (abdomen), and breasts.  Your breasts will grow and may hurt.  Dark spots or blotches may develop on your face.  A dark line from your belly button to the pubic area (linea nigra) may appear.  You may have changes in your hair. Health changes  You may have headaches.  You may have  heartburn.  You may have trouble pooping (constipation).  You may have hemorrhoids or swollen, bulging veins (varicose veins).  Your gums may bleed.  You may pee (urinate) more often.  You  may have back pain. Follow these instructions at home: Medicines  Take over-the-counter and prescription medicines only as told by your doctor. Some medicines are not safe during pregnancy.  Take a prenatal vitamin that contains at least 600 micrograms (mcg) of folic acid. Eating and drinking  Eat healthy meals that include: ? Fresh fruits and vegetables. ? Whole grains. ? Good sources of protein, such as meat, eggs, or tofu. ? Low-fat dairy products.  Avoid raw meat and unpasteurized juice, milk, and cheese.  You may need to take these actions to prevent or treat trouble pooping: ? Drink enough fluids to keep your pee (urine) pale yellow. ? Eat foods that are high in fiber. These include beans, whole grains, and fresh fruits and vegetables. ? Limit foods that are high in fat and sugar. These include fried or sweet foods. Activity  Exercise only as told by your doctor. Most people can do their usual exercise during pregnancy. Try to exercise for 30 minutes at least 5 days a week.  Stop exercising if you have pain or cramps in your belly or lower back.  Do not exercise if it is too hot or too humid, or if you are in a place of great height (high altitude).  Avoid heavy lifting.  If you choose to, you may have sex unless your doctor tells you not to. Relieving pain and discomfort  Wear a good support bra if your breasts are sore.  Take warm water baths (sitz baths) to soothe pain or discomfort caused by hemorrhoids. Use hemorrhoid cream if your doctor approves.  Rest with your legs raised (elevated) if you have leg cramps or low back pain.  If you develop bulging veins in your legs: ? Wear support hose as told by your doctor. ? Raise your feet for 15 minutes, 3-4 times a day. ? Limit salt in your food. Safety  Wear your seat belt at all times when you are in a car.  Talk with your doctor if someone is hurting you or yelling at you a lot. Lifestyle  Do not use hot  tubs, steam rooms, or saunas.  Do not douche. Do not use tampons or scented sanitary pads.  Avoid cat litter boxes and soil used by cats. These carry germs that can harm your baby and can cause a loss of your baby by miscarriage or stillbirth.  Do not use herbal medicines, illegal drugs, or medicines that are not approved by your doctor. Do not drink alcohol.  Do not smoke or use any products that contain nicotine or tobacco. If you need help quitting, ask your doctor. General instructions  Keep all follow-up visits. This is important.  Ask your doctor about local prenatal classes.  Ask your doctor about the right foods to eat or for help finding a counselor. Where to find more information  American Pregnancy Association: americanpregnancy.org  [employer]: www.acog.org  Office on [employer]: KeywordPortfolios.com.br Contact a doctor if:  You have a headache that does not go away when you take medicine.  You have changes in how you see, or you see spots in front of your eyes.  You have mild cramps, pressure, or pain in your lower belly.  You continue to feel like you may vomit (nauseous), you vomit,  or you have watery poop (diarrhea).  You have bad-smelling fluid coming from your vagina.  You have pain when you pee or your pee smells bad.  You have very bad swelling of your face, hands, ankles, feet, or legs.  You have a fever. Get help right away if:  You are leaking fluid from your vagina.  You have spotting or bleeding from your vagina.  You have very bad belly cramping or pain.  You have trouble breathing.  You have chest pain.  You faint.  You have not felt your baby move for the time period told by your doctor.  You have new or increased pain, swelling, or redness in an arm or leg. Summary  The second trimester of pregnancy is from week 13 through week 27 (months 4 through 6).  Eat healthy  meals.  Exercise as told by your doctor. Most people can do their usual exercise during pregnancy.  Do not use herbal medicines, illegal drugs, or medicines that are not approved by your doctor. Do not drink alcohol.  Call your doctor if you get sick or if you notice anything unusual about your pregnancy. This information is not intended to replace advice given to you by your health care provider. Make sure you discuss any questions you have with your health care provider. Document Revised: 06/07/2019 Document Reviewed: 04/13/2019 Elsevier Patient Education  2021 [employer].

## 2020-02-09 LAB — URINALYSIS, ROUTINE W REFLEX MICROSCOPIC
Bilirubin, UA: NEGATIVE
Glucose, UA: NEGATIVE
Ketones, UA: NEGATIVE
Leukocytes,UA: NEGATIVE
Nitrite, UA: NEGATIVE
Protein,UA: NEGATIVE
RBC, UA: NEGATIVE
Specific Gravity, UA: 1.019 (ref 1.005–1.030)
Urobilinogen, Ur: 0.2 mg/dL (ref 0.2–1.0)
pH, UA: 6 (ref 5.0–7.5)

## 2020-02-10 LAB — MONITOR DRUG PROFILE 14(MW)
Amphetamine Scrn, Ur: NEGATIVE ng/mL
BARBITURATE SCREEN URINE: NEGATIVE ng/mL
BENZODIAZEPINE SCREEN, URINE: NEGATIVE ng/mL
Buprenorphine, Urine: NEGATIVE ng/mL
CANNABINOIDS UR QL SCN: NEGATIVE ng/mL
Cocaine (Metab) Scrn, Ur: NEGATIVE ng/mL
Creatinine(Crt), U: 76 mg/dL (ref 20.0–300.0)
Fentanyl, Urine: NEGATIVE pg/mL
Meperidine Screen, Urine: NEGATIVE ng/mL
Methadone Screen, Urine: NEGATIVE ng/mL
OXYCODONE+OXYMORPHONE UR QL SCN: NEGATIVE ng/mL
Opiate Scrn, Ur: NEGATIVE ng/mL
Ph of Urine: 6.2 (ref 4.5–8.9)
Phencyclidine Qn, Ur: NEGATIVE ng/mL
Propoxyphene Scrn, Ur: NEGATIVE ng/mL
SPECIFIC GRAVITY: 1.019
Tramadol Screen, Urine: NEGATIVE ng/mL

## 2020-02-10 LAB — GC/CHLAMYDIA PROBE AMP
Chlamydia trachomatis, NAA: NEGATIVE
Neisseria Gonorrhoeae by PCR: NEGATIVE

## 2020-02-11 LAB — URINE CULTURE

## 2020-02-13 ENCOUNTER — Telehealth: Payer: Self-pay

## 2020-02-13 LAB — CBC WITH DIFFERENTIAL
Basophils Absolute: 0 10*3/uL (ref 0.0–0.2)
Basos: 0 %
EOS (ABSOLUTE): 0.3 10*3/uL (ref 0.0–0.4)
Eos: 3 %
Hematocrit: 38.6 % (ref 34.0–46.6)
Hemoglobin: 12.9 g/dL (ref 11.1–15.9)
Immature Grans (Abs): 0 10*3/uL (ref 0.0–0.1)
Immature Granulocytes: 0 %
Lymphocytes Absolute: 1.9 10*3/uL (ref 0.7–3.1)
Lymphs: 19 %
MCH: 29.2 pg (ref 26.6–33.0)
MCHC: 33.4 g/dL (ref 31.5–35.7)
MCV: 87 fL (ref 79–97)
Monocytes Absolute: 0.7 10*3/uL (ref 0.1–0.9)
Monocytes: 7 %
Neutrophils Absolute: 6.7 10*3/uL (ref 1.4–7.0)
Neutrophils: 71 %
RBC: 4.42 x10E6/uL (ref 3.77–5.28)
RDW: 12.7 % (ref 11.7–15.4)
WBC: 9.6 10*3/uL (ref 3.4–10.8)

## 2020-02-13 LAB — RPR, QUANT+TP ABS (REFLEX)
Rapid Plasma Reagin, Quant: 1:1 {titer} — ABNORMAL HIGH
T Pallidum Abs: NONREACTIVE

## 2020-02-13 LAB — HCV INTERPRETATION

## 2020-02-13 LAB — RPR: RPR Ser Ql: REACTIVE — AB

## 2020-02-13 LAB — VIRAL HEPATITIS HBV, HCV
HCV Ab: <0.1 s/co ratio (ref 0.0–0.9)
Hep B Core Total Ab: NEGATIVE
Hep B Surface Ab, Qual: NONREACTIVE
Hepatitis B Surface Ag: NEGATIVE

## 2020-02-13 LAB — HIV ANTIBODY (ROUTINE TESTING W REFLEX): HIV Screen 4th Generation wRfx: NONREACTIVE

## 2020-02-13 LAB — HEMOGLOBIN A1C
Est. average glucose Bld gHb Est-mCnc: 120 mg/dL
Hgb A1c MFr Bld: 5.8 % — ABNORMAL HIGH (ref 4.8–5.6)

## 2020-02-13 LAB — ANTIBODY SCREEN: Antibody Screen: NEGATIVE

## 2020-02-13 LAB — ABO AND RH: Rh Factor: POSITIVE

## 2020-02-13 LAB — RUBELLA SCREEN: Rubella Antibodies, IGG: 7.04 index (ref >0.99)

## 2020-02-13 LAB — VARICELLA ZOSTER ANTIBODY, IGG: Varicella zoster IgG: 344 index (ref >165)

## 2020-02-13 NOTE — Telephone Encounter (Signed)
Pt called in and stated that she got test results back and is requesting a call. The pt stated that she is concerned at her labs. please advise

## 2020-02-13 NOTE — Telephone Encounter (Signed)
Called pt and informed her that we had to move her ultrasound appt to 2/8, the pt stated that it was fine ot move her appt

## 2020-02-14 ENCOUNTER — Other Ambulatory Visit: Payer: BC Managed Care – PPO

## 2020-02-15 ENCOUNTER — Telehealth: Payer: Self-pay

## 2020-02-15 ENCOUNTER — Encounter: Payer: Self-pay | Admitting: Certified Nurse Midwife

## 2020-02-15 DIAGNOSIS — A530 Latent syphilis, unspecified as early or late: Secondary | ICD-10-CM | POA: Insufficient documentation

## 2020-02-15 NOTE — Telephone Encounter (Signed)
mychart message sent to patient

## 2020-02-15 NOTE — Telephone Encounter (Signed)
Please contact patient. MyChart sent regarding labs results. 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 We will repeat testing again at 28 weeks. She may call or message with further questions. Thanks, JML

## 2020-02-15 NOTE — Telephone Encounter (Signed)
Negative T pallidum, does NOT have syphilis. Thanks, JML

## 2020-02-20 ENCOUNTER — Other Ambulatory Visit: Payer: Self-pay

## 2020-02-20 ENCOUNTER — Ambulatory Visit: Payer: BC Managed Care – PPO

## 2020-02-20 DIAGNOSIS — Z3201 Encounter for pregnancy test, result positive: Secondary | ICD-10-CM | POA: Diagnosis not present

## 2020-02-20 DIAGNOSIS — N926 Irregular menstruation, unspecified: Secondary | ICD-10-CM

## 2020-02-23 ENCOUNTER — Telehealth: Payer: Self-pay

## 2020-02-23 NOTE — Telephone Encounter (Signed)
mychart message sent to patient

## 2020-02-29 ENCOUNTER — Other Ambulatory Visit: Payer: Self-pay

## 2020-02-29 ENCOUNTER — Ambulatory Visit (INDEPENDENT_AMBULATORY_CARE_PROVIDER_SITE_OTHER): Payer: BC Managed Care – PPO | Admitting: Certified Nurse Midwife

## 2020-02-29 ENCOUNTER — Telehealth: Payer: Self-pay

## 2020-02-29 ENCOUNTER — Other Ambulatory Visit: Payer: Self-pay | Admitting: Certified Nurse Midwife

## 2020-02-29 ENCOUNTER — Encounter: Payer: Self-pay | Admitting: Certified Nurse Midwife

## 2020-02-29 VITALS — BP 101/70 | HR 87 | Wt 142.8 lb

## 2020-02-29 DIAGNOSIS — Z3482 Encounter for supervision of other normal pregnancy, second trimester: Secondary | ICD-10-CM

## 2020-02-29 DIAGNOSIS — Z3A15 15 weeks gestation of pregnancy: Secondary | ICD-10-CM

## 2020-02-29 DIAGNOSIS — Z8632 Personal history of gestational diabetes: Secondary | ICD-10-CM

## 2020-02-29 DIAGNOSIS — R519 Headache, unspecified: Secondary | ICD-10-CM

## 2020-02-29 DIAGNOSIS — Z131 Encounter for screening for diabetes mellitus: Secondary | ICD-10-CM

## 2020-02-29 DIAGNOSIS — O26892 Other specified pregnancy related conditions, second trimester: Secondary | ICD-10-CM

## 2020-02-29 DIAGNOSIS — Z3689 Encounter for other specified antenatal screening: Secondary | ICD-10-CM

## 2020-02-29 LAB — POCT URINALYSIS DIPSTICK OB
Appearance: ABNORMAL
Bilirubin, UA: NEGATIVE
Blood, UA: POSITIVE
Glucose, UA: NEGATIVE
Ketones, UA: NEGATIVE
Leukocytes, UA: NEGATIVE
Nitrite, UA: NEGATIVE
Odor: NORMAL
Spec Grav, UA: 1.02 (ref 1.010–1.025)
Urobilinogen, UA: 0.2 E.U./dL
pH, UA: 6.5 (ref 5.0–8.0)

## 2020-02-29 MED ORDER — ACCU-CHEK SMARTVIEW VI STRP: ORAL_STRIP | 12 refills | Status: DC

## 2020-02-29 MED ORDER — DOCUSATE SODIUM 100 MG PO CAPS: 100.0000 mg | ORAL_CAPSULE | Freq: Two times a day (BID) | ORAL | 2 refills | Status: DC | PRN

## 2020-02-29 MED ORDER — ACCU-CHEK NANO SMARTVIEW W/DEVICE KIT: 1.0000 | PACK | 0 refills | Status: DC

## 2020-02-29 MED ORDER — ACCU-CHEK GUIDE W/DEVICE KIT: PACK | 0 refills | Status: DC

## 2020-02-29 MED ORDER — POLYETHYLENE GLYCOL 3350 17 GM/SCOOP PO POWD: 1.0000 | Freq: Once | ORAL | 0 refills | Status: DC

## 2020-02-29 NOTE — Patient Instructions (Signed)

## 2020-02-29 NOTE — Progress Notes (Signed)
ROB-She has been constipated x 3 days. She thinks her blood sugar is elevated causing her to have headaches.

## 2020-02-29 NOTE — Telephone Encounter (Deleted)
The original result note has been redacted due to error. °

## 2020-02-29 NOTE — Progress Notes (Signed)
ROB-Reports constipation for the last three (3) days and headaches. Questions elevated blood sugar. Rx Glucose meter and strips, Miralax and Colace; see orders. Anticipatory guidance regarding course of prenatal care. Reviewed red flag symptoms and when to call. RTC x 4 weeks for glucose testing, ANATOMY SCAN and ROB or sooner if needed.

## 2020-03-05 ENCOUNTER — Other Ambulatory Visit: Payer: Self-pay

## 2020-03-05 MED ORDER — ACCU-CHEK SOFTCLIX LANCETS MISC: 12 refills | Status: DC

## 2020-03-22 NOTE — Addendum Note (Signed)
Addended by: Marchelle Folks on: 03/22/2020 04:21 PM   Modules accepted: Level of Service, SmartSet

## 2020-03-22 NOTE — Telephone Encounter (Signed)
This encounter was created in error - please disregard.

## 2020-03-27 ENCOUNTER — Encounter: Payer: Self-pay | Admitting: Certified Nurse Midwife

## 2020-03-27 ENCOUNTER — Ambulatory Visit
Admission: RE | Admit: 2020-03-27 | Discharge: 2020-03-27 | Disposition: A | Discharge status: Home / Self Care | Payer: BC Managed Care – PPO | Source: Ambulatory Visit | Attending: Certified Nurse Midwife | Admitting: Certified Nurse Midwife

## 2020-03-27 ENCOUNTER — Ambulatory Visit (INDEPENDENT_AMBULATORY_CARE_PROVIDER_SITE_OTHER): Payer: BC Managed Care – PPO | Admitting: Certified Nurse Midwife

## 2020-03-27 ENCOUNTER — Other Ambulatory Visit: Payer: BC Managed Care – PPO

## 2020-03-27 ENCOUNTER — Other Ambulatory Visit: Payer: Self-pay

## 2020-03-27 VITALS — BP 101/60 | HR 76 | Wt 151.0 lb

## 2020-03-27 DIAGNOSIS — Z3689 Encounter for other specified antenatal screening: Secondary | ICD-10-CM | POA: Diagnosis present

## 2020-03-27 DIAGNOSIS — Z3482 Encounter for supervision of other normal pregnancy, second trimester: Secondary | ICD-10-CM

## 2020-03-27 DIAGNOSIS — Z3A15 15 weeks gestation of pregnancy: Secondary | ICD-10-CM | POA: Insufficient documentation

## 2020-03-27 DIAGNOSIS — Z8632 Personal history of gestational diabetes: Secondary | ICD-10-CM

## 2020-03-27 DIAGNOSIS — Z131 Encounter for screening for diabetes mellitus: Secondary | ICD-10-CM

## 2020-03-27 DIAGNOSIS — Z3A19 19 weeks gestation of pregnancy: Secondary | ICD-10-CM

## 2020-03-27 LAB — POCT URINALYSIS DIPSTICK OB
Bilirubin, UA: NEGATIVE
Blood, UA: NEGATIVE
Glucose, UA: NEGATIVE
Ketones, UA: NEGATIVE
Leukocytes, UA: NEGATIVE
Nitrite, UA: NEGATIVE
Odor: NEGATIVE
POC,PROTEIN,UA: NEGATIVE
Spec Grav, UA: <=1.005 — AB (ref 1.010–1.025)
Urobilinogen, UA: 0.2 E.U./dL
pH, UA: 6 (ref 5.0–8.0)

## 2020-03-27 NOTE — Progress Notes (Signed)
ROB- When sleeping she feels cramps in her lower abd. Wants a doula.

## 2020-03-27 NOTE — Patient Instructions (Signed)

## 2020-03-27 NOTE — Progress Notes (Signed)
ROB doing well. Early 1 hr glucose and anatomy scan today. Will follow up with results. Discussed round ligament pain and self help measures. She verbalizes understanding. Pt requested information on Doula, 2 pamphlets given. Encouragement given for doula use. Follow up 4 wks with Marcelino Duster for ROB.   Doreene Burke, CNM

## 2020-03-28 LAB — GLUCOSE, 1 HOUR GESTATIONAL: Gestational Diabetes Screen: 209 mg/dL — ABNORMAL HIGH (ref 65–139)

## 2020-03-29 ENCOUNTER — Telehealth: Payer: Self-pay | Admitting: Certified Nurse Midwife

## 2020-03-29 ENCOUNTER — Encounter: Payer: Self-pay | Admitting: Certified Nurse Midwife

## 2020-03-29 ENCOUNTER — Other Ambulatory Visit: Payer: Self-pay | Admitting: Certified Nurse Midwife

## 2020-03-29 DIAGNOSIS — O358XX0 Maternal care for other (suspected) fetal abnormality and damage, not applicable or unspecified: Secondary | ICD-10-CM | POA: Insufficient documentation

## 2020-03-29 DIAGNOSIS — Z3A19 19 weeks gestation of pregnancy: Secondary | ICD-10-CM

## 2020-03-29 DIAGNOSIS — O35EXX0 Maternal care for other (suspected) fetal abnormality and damage, fetal genitourinary anomalies, not applicable or unspecified: Secondary | ICD-10-CM | POA: Insufficient documentation

## 2020-03-29 DIAGNOSIS — O24419 Gestational diabetes mellitus in pregnancy, unspecified control: Secondary | ICD-10-CM

## 2020-03-29 NOTE — Progress Notes (Signed)
Referral to Lifestyles, see chart.    Serafina Royals, CNM [facility], [facility] 03/29/20 12:09 PM

## 2020-03-29 NOTE — Telephone Encounter (Signed)
New Message:  Pt called and is wanting her glucose results

## 2020-04-01 ENCOUNTER — Other Ambulatory Visit: Payer: Self-pay

## 2020-04-01 MED ORDER — CITRANATAL BLOOM 90-1 MG PO TABS: 1.0000 | ORAL_TABLET | Freq: Every day | ORAL | 11 refills | Status: DC

## 2020-04-01 NOTE — Telephone Encounter (Signed)
Pt aware u/s will be repeated at 28 weeks to assess baby's kidney.   Glucose monitor, test strips and lancets were erxed.   PNV -erx per pt request.

## 2020-04-02 ENCOUNTER — Other Ambulatory Visit: Payer: Self-pay

## 2020-04-02 MED ORDER — PRENATE PIXIE 10-0.6-0.4-200 MG PO CAPS: 200.0000 mg | ORAL_CAPSULE | Freq: Every day | ORAL | 11 refills | Status: DC

## 2020-04-05 ENCOUNTER — Telehealth: Payer: Self-pay | Admitting: Certified Nurse Midwife

## 2020-04-05 NOTE — Telephone Encounter (Signed)
New message    Pt c/o medication issue:  1. Name of Medication: Prenat-FeAsp-Meth-FA-DHA w/o A (PRENATE PIXIE) 10-0.6-0.4-200 MG CAPS  2. How are you currently taking this medication (dosage and times per day)? Once a day   3. Are you having a reaction (difficulty breathing--STAT)? No   4. What is your medication issue? The cost is $ 300.00 insurance did not cover it , looking for something cheaper  5. [facility] [employer] [facility].

## 2020-04-05 NOTE — Telephone Encounter (Signed)
Pt aware per her insurance the cheapest PNV is 70.00 for 60 tablets.   Pt will purchase OTC PNV.

## 2020-04-08 ENCOUNTER — Encounter: Payer: BC Managed Care – PPO | Attending: Certified Nurse Midwife | Admitting: *Deleted

## 2020-04-08 ENCOUNTER — Other Ambulatory Visit: Payer: Self-pay

## 2020-04-08 ENCOUNTER — Encounter: Payer: Self-pay | Admitting: *Deleted

## 2020-04-08 VITALS — BP 108/60 | Ht 64.0 in | Wt 151.9 lb

## 2020-04-08 DIAGNOSIS — Z3A19 19 weeks gestation of pregnancy: Secondary | ICD-10-CM | POA: Insufficient documentation

## 2020-04-08 DIAGNOSIS — O24419 Gestational diabetes mellitus in pregnancy, unspecified control: Secondary | ICD-10-CM | POA: Insufficient documentation

## 2020-04-08 DIAGNOSIS — O24410 Gestational diabetes mellitus in pregnancy, diet controlled: Secondary | ICD-10-CM

## 2020-04-08 NOTE — Progress Notes (Signed)
Diabetes Self-Management Education  Visit Type: First/Initial  Appt. Start Time: 1320 Appt. End Time: 1445  04/08/2020  [name], identified by name and date of birth, is a 45 y.o. female with a diagnosis of Diabetes: Gestational Diabetes.   ASSESSMENT  Blood pressure 108/60, height 5\' 4"  (1.626 m), weight 151 lb 14.4 oz (68.9 kg), last menstrual period 11/13/2019, estimated date of delivery 08/23/2020 Body mass index is 26.07 kg/m.   Diabetes Self-Management Education - 04/08/20 1618      Visit Information   Visit Type First/Initial      Initial Visit   Diabetes Type Gestational Diabetes    Are you currently following a meal plan? Yes    What type of meal plan do you follow? "just eating less and stop some foods and sodas"    Are you taking your medications as prescribed? Yes    Date Diagnosed this month for gestational - also had this 5 years ago; pt reports 10 years she has been told about pre-diabetes      Health Coping   How would you rate your overall health? Good      Psychosocial Assessment   Patient Belief/Attitude about Diabetes Other (comment)   "little stress"   Self-care barriers None    Self-management support Doctor's office;Family    Patient Concerns Nutrition/Meal planning;Glycemic Control;Monitoring;Weight Control;Healthy Lifestyle    Special Needs None    Preferred Learning Style Auditory;Hands on    Learning Readiness Ready    How often do you need to have someone help you when you read instructions, pamphlets, or other written materials from your doctor or pharmacy? 1 - Never    What is the last grade level you completed in school? 11th      Pre-Education Assessment   Patient understands the diabetes disease and treatment process. Needs Review    Patient understands incorporating nutritional management into lifestyle. Needs Instruction    Patient undertands incorporating physical activity into lifestyle. Needs Instruction    Patient  understands using medications safely. Needs Instruction    Patient understands monitoring blood glucose, interpreting and using results Needs Review    Patient understands prevention, detection, and treatment of acute complications. Needs Instruction    Patient understands prevention, detection, and treatment of chronic complications. Needs Instruction    Patient understands how to develop strategies to address psychosocial issues. Needs Instruction    Patient understands how to develop strategies to promote health/change behavior. Needs Instruction      Complications   Last HgB A1C per patient/outside source 5.8 %   02/08/2020   How often do you check your blood sugar? 1-2 times/day    Fasting Blood glucose range (mg/dL) 02/10/2020   2 FBG's 16-109 mg/dL on 604 and 86 mg/dL this morning   Postprandial Blood glucose range (mg/dL) 5/40   readings after breakfast only - 123 mg/dL today after having fruit "shake" and tortillas; 118 mg/dL yesterday and 98-119;147-829 and 562 mg/dL last week   Have you had a dilated eye exam in the past 12 months? No    Have you had a dental exam in the past 12 months? Yes    Are you checking your feet? Yes    How many days per week are you checking your feet? 4      Dietary Intake   Breakfast milk and banana; eggs, juice and toast; strawberry and blueberry shake with tortillas and nuts (almonds, sunflower seeds)    Snack (morning) reports 2 snacks/day  of fruit (banana, tangerine, apple, pear, watermelon, cantoloupe)    Lunch celery with range, chicken, tortillas, rice and beans    Dinner chicken, beef, pork; potatoes, peas, beans, conr, rice, pasta, green beans, tomatoes, carrots, cuccumbers, peppers, onions, zucchini    Beverage(s) water, fruit juice, milk, sometimes soda      Exercise   Exercise Type ADL's      Patient Education   Previous Diabetes Education Yes (please comment)   prior GDM in 2017 and had class here   Disease state  Definition of diabetes, type 1  and 2, and the diagnosis of diabetes;Factors that contribute to the development of diabetes    Nutrition management  Role of diet in the treatment of diabetes and the relationship between the three main macronutrients and blood glucose level;Food label reading, portion sizes and measuring food.;Reviewed blood glucose goals for pre and post meals and how to evaluate the patients' food intake on their blood glucose level.    Physical activity and exercise  Role of exercise on diabetes management, blood pressure control and cardiac health.    Medications Other (comment)   Limited use of oral medications during pregnancy and potential for insulin.   Monitoring Purpose and frequency of SMBG.;Taught/discussed recording of test results and interpretation of SMBG.;Identified appropriate SMBG and/or A1C goals.;Ketone testing, when, how.    Acute complications Taught treatment of hypoglycemia - the 15 rule.    Chronic complications Relationship between chronic complications and blood glucose control    Psychosocial adjustment Identified and addressed patients feelings and concerns about diabetes    Preconception care Pregnancy and GDM  Role of pre-pregnancy blood glucose control on the development of the fetus;Reviewed with patient blood glucose goals with pregnancy;Role of family planning for patients with diabetes      Individualized Goals (developed by patient)   Reducing Risk Other (comment)   improve blood sugars, prevent diabetes complications, lose weight, lead a healthier lifestyle, become more fit     Outcomes   Expected Outcomes Demonstrated interest in learning. Expect positive outcomes           Individualized Plan for Diabetes Self-Management Training:   Learning Objective:  Patient will have a greater understanding of diabetes self-management. Patient education plan is to attend individual and/or group sessions per assessed needs and concerns.   Plan:   Patient Instructions  Read  booklet on Gestational Diabetes Follow Gestational Meal Planning Guidelines Avoid fruit juice and soda unless treating a low blood sugar Avoid fruit for breakfast Carry glucose tablets or peppermints and a snack Complete a 3 Day Food Record and bring to next appointment Check blood sugars 4 x day - before breakfast and 2 hrs after every meal and record  Bring blood sugar log to all appointments Purchase urine ketone strips if instructed by MD and check urine ketones every am:  If + increase bedtime snack to 1 protein and 2 carbohydrate servings Walk 20-30 minutes at least 5 x week if permitted by MD  Expected Outcomes:  Demonstrated interest in learning. Expect positive outcomes  Education material provided:  Gestational Booklet Gestational Meal Planning Guidelines Simple Meal Plan 3 Day Food Record Goals for a Healthy Pregnancy Glucose tablets Symptoms, causes and treatments of Hypoglycemia  If problems or questions, patient to contact team via:  Sharion Settler, RN, CCM, CDCES [phone]  Future DSME appointment: April 17, 2020 with the dietitian

## 2020-04-08 NOTE — Patient Instructions (Signed)
Read booklet on Gestational Diabetes Follow Gestational Meal Planning Guidelines Avoid fruit juice and soda unless treating a low blood sugar Avoid fruit for breakfast Carry glucose tablets or peppermints and a snack Complete a 3 Day Food Record and bring to next appointment Check blood sugars 4 x day - before breakfast and 2 hrs after every meal and record  Bring blood sugar log to all appointments Purchase urine ketone strips if instructed by MD and check urine ketones every am:  If + increase bedtime snack to 1 protein and 2 carbohydrate servings Walk 20-30 minutes at least 5 x week if permitted by MD

## 2020-04-17 ENCOUNTER — Encounter: Payer: Self-pay | Admitting: Dietician

## 2020-04-17 ENCOUNTER — Encounter: Payer: BC Managed Care – PPO | Attending: Certified Nurse Midwife | Admitting: Dietician

## 2020-04-17 ENCOUNTER — Other Ambulatory Visit: Payer: Self-pay

## 2020-04-17 VITALS — BP 90/56 | Ht 64.0 in | Wt 151.5 lb

## 2020-04-17 DIAGNOSIS — O24410 Gestational diabetes mellitus in pregnancy, diet controlled: Secondary | ICD-10-CM | POA: Diagnosis present

## 2020-04-17 NOTE — Progress Notes (Signed)
.   Patient's BG record indicates fasting BGs ranging 85-100, and post-meal BGs ranging 82-136. She reports an episode of low BG symptoms which she treated with glucose tablets. . Patient's food diary indicates generally controlled amounts of carbohydrate with meals close to or within goal range; eating at regular intervals, inclusion of protein source and vegetables with most meals.  . Provided basic balanced meal plan with instruction, and wrote individualized menus based on patient's food preferences. . Instructed patient on food safety, including avoidance of Listeriosis, and limiting mercury from fish. . Reviewed appropriate treatment for low BGs. . Discussed importance of maintaining healthy lifestyle habits to reduce risk of Type 2 DM as well as Gestational DM with any future pregnancies. . Advised patient to use any remaining testing supplies to test some BGs after delivery, and to have BG tested ideally annually, as well as prior to attempting future pregnancies.

## 2020-04-17 NOTE — Patient Instructions (Signed)
   Good job controlling carbs with meals and snacks, keep it up!  Find ways to relax when you start to feel stressed; try breathing slow and deep and imagine yourself in your favorite relaxing place. A few minute of slow deep breathing can start to relieve stress and help blood sugar and blood pressure.  If you feel shaky and dizzy, check blood sugar if you can. If less than 70, then take 3-4 glucose tablets and you will probably feel better in 10-15 minutes. Recheck blood sugar to make sure. If normal, eat a meal or snack within 30 minutes. If still low, repeat the glucose tabs, recheck again, and follow with meal or snack.

## 2020-04-23 ENCOUNTER — Telehealth: Payer: Self-pay | Admitting: Certified Nurse Midwife

## 2020-04-23 NOTE — Telephone Encounter (Signed)
Carlisle called in and states she is noticing red spots on her belly and they are itchy and they hurt.  Galilea states that this has been going on since Saturday.  Grecia wants to know if she should wait to bring this up at her appointment on Thursday, or does something need to happen now?  Patient denies fever and nausea.  Please advise.

## 2020-04-24 NOTE — Telephone Encounter (Signed)
Yesenia Davis called back and states she went to urgent care last night because the rash started to show up under Yesenia Davis breast and was really painful.  The doctor at the urgent care diagnosed Yesenia Davis with shingles.  Yesenia Davis states they couldn't prescribe the anti-viral medication due to Yesenia Davis being pregnant.  The doctor did prescribe Tylenol with codeine and told Yesenia Davis to double check if she can take that medication for pain since she is pregnant.  Yesenia Davis wants to know if she should still come to Yesenia Davis appointment tomorrow 04/25/2020 while having shingles.  Please advise.

## 2020-04-25 ENCOUNTER — Encounter: Payer: Self-pay | Admitting: Certified Nurse Midwife

## 2020-04-25 ENCOUNTER — Other Ambulatory Visit: Payer: Self-pay | Admitting: Certified Nurse Midwife

## 2020-04-25 ENCOUNTER — Ambulatory Visit (INDEPENDENT_AMBULATORY_CARE_PROVIDER_SITE_OTHER): Payer: BC Managed Care – PPO | Admitting: Certified Nurse Midwife

## 2020-04-25 VITALS — BP 105/72 | HR 73 | Wt 151.0 lb

## 2020-04-25 DIAGNOSIS — B029 Zoster without complications: Secondary | ICD-10-CM

## 2020-04-25 DIAGNOSIS — O35EXX0 Maternal care for other (suspected) fetal abnormality and damage, fetal genitourinary anomalies, not applicable or unspecified: Secondary | ICD-10-CM

## 2020-04-25 DIAGNOSIS — Z3A22 22 weeks gestation of pregnancy: Secondary | ICD-10-CM

## 2020-04-25 DIAGNOSIS — O09522 Supervision of elderly multigravida, second trimester: Secondary | ICD-10-CM

## 2020-04-25 DIAGNOSIS — Z3402 Encounter for supervision of normal first pregnancy, second trimester: Secondary | ICD-10-CM

## 2020-04-25 DIAGNOSIS — O24419 Gestational diabetes mellitus in pregnancy, unspecified control: Secondary | ICD-10-CM

## 2020-04-25 DIAGNOSIS — O358XX0 Maternal care for other (suspected) fetal abnormality and damage, not applicable or unspecified: Secondary | ICD-10-CM

## 2020-04-25 DIAGNOSIS — Z3482 Encounter for supervision of other normal pregnancy, second trimester: Secondary | ICD-10-CM

## 2020-04-25 DIAGNOSIS — Z3689 Encounter for other specified antenatal screening: Secondary | ICD-10-CM

## 2020-04-25 LAB — POCT URINALYSIS DIPSTICK OB
Bilirubin, UA: NEGATIVE
Blood, UA: POSITIVE
Glucose, UA: NEGATIVE
Ketones, UA: NEGATIVE
Leukocytes, UA: NEGATIVE
Nitrite, UA: NEGATIVE
Spec Grav, UA: 1.015 (ref 1.010–1.025)
Urobilinogen, UA: 0.2 E.U./dL
pH, UA: 6.5 (ref 5.0–8.0)

## 2020-04-25 MED ORDER — HYDROXYZINE HCL 25 MG PO TABS: 25.0000 mg | ORAL_TABLET | Freq: Four times a day (QID) | ORAL | 0 refills | Status: DC | PRN

## 2020-04-25 MED ORDER — VALACYCLOVIR HCL 500 MG PO TABS: ORAL_TABLET | ORAL | 5 refills | Status: DC

## 2020-04-25 NOTE — Progress Notes (Signed)
Rx Valtrex, see orders.    Serafina Royals, CNM [facility], [facility] 04/25/20 9:15 AM

## 2020-04-25 NOTE — Progress Notes (Signed)
ROB-Diagnosed with Shingles at [facility]; for further details, please see note. Taking Valtrex as prescribed. Reports itching along shingles and increased anxiety due to impending death of father. Questions use of Tylenol #3 for pain management. Rx Vistaril, see orders. Findings of anatomy scan discussed with patient, normal except for left fetal renal pyelectasis; verbalized understanding. Referral to MFM placed, see orders. Blood sugar log reviewed in detail with patient. Dr. Logan Bores consulted, patient given additional time to modify diet; however, if elevating fasting levels persist then patient will need to start Glyburide night. Patient agrees with plan of care. Reviewed red flag symptoms and when to call. RTC x 4 weeks for ROB or sooner if needed.

## 2020-04-25 NOTE — Telephone Encounter (Signed)
Patient was seen in office today.  

## 2020-04-25 NOTE — Patient Instructions (Signed)
Valacyclovir caplets What is this medicine? VALACYCLOVIR (val ay SYE kloe veer) is an antiviral medicine. It is used to treat or prevent infections caused by certain kinds of viruses. Examples of these infections include herpes and shingles. This medicine will not cure herpes. This medicine may be used for other purposes; ask your health care provider or pharmacist if you have questions. COMMON BRAND NAME(S): Valtrex What should I tell my health care provider before I take this medicine? They need to know if you have any of these conditions:  acquired immunodeficiency syndrome (AIDS)  any other condition that may weaken the immune system  bone marrow or kidney transplant  kidney disease  an unusual or allergic reaction to valacyclovir, acyclovir, ganciclovir, valganciclovir, other medicines, foods, dyes, or preservatives  pregnant or trying to get pregnant  breast-feeding How should I use this medicine? Take this medicine by mouth with a glass of water. Follow the directions on the prescription label. You can take this medicine with or without food. Take your doses at regular intervals. Do not take your medicine more often than directed. Finish the full course prescribed by your doctor or health care professional even if you think your condition is better. Do not stop taking except on the advice of your doctor or health care professional. Talk to your pediatrician regarding the use of this medicine in children. While this drug may be prescribed for children as young as 2 years for selected conditions, precautions do apply. Overdosage: If you think you have taken too much of this medicine contact a poison control center or emergency room at once. NOTE: This medicine is only for you. Do not share this medicine with others. What if I miss a dose? If you miss a dose, take it as soon as you can. If it is almost time for your next dose, take only that dose. Do not take double or extra  doses. What may interact with this medicine? Do not take this medicine with any of the following medications:  cidofovir This medicine may also interact with the following medications:  adefovir  amphotericin B  certain antibiotics like amikacin, gentamicin, tobramycin, vancomycin  cimetidine  cisplatin  colistin  cyclosporine  foscarnet  lithium  methotrexate  probenecid  tacrolimus This list may not describe all possible interactions. Give your health care provider a list of all the medicines, herbs, non-prescription drugs, or dietary supplements you use. Also tell them if you smoke, drink alcohol, or use illegal drugs. Some items may interact with your medicine. What should I watch for while using this medicine? Tell your doctor or health care professional if your symptoms do not start to get better after 1 week. This medicine works best when taken early in the course of an infection, within the first 59 hours. Begin treatment as soon as possible after the first signs of infection like tingling, itching, or pain in the affected area. It is possible that genital herpes may still be spread even when you are not having symptoms. Always use safer sex practices like condoms made of latex or polyurethane whenever you have sexual contact. You should stay well hydrated while taking this medicine. Drink plenty of fluids. What side effects may I notice from receiving this medicine? Side effects that you should report to your doctor or health care professional as soon as possible:  allergic reactions like skin rash, itching or hives, swelling of the face, lips, or tongue  aggressive behavior  confusion  hallucinations  problems  with balance, talking, walking  stomach pain  tremor  trouble passing urine or change in the amount of urine Side effects that usually do not require medical attention (report to your doctor or health care professional if they continue or are  bothersome):  dizziness  headache  nausea, vomiting This list may not describe all possible side effects. Call your doctor for medical advice about side effects. You may report side effects to FDA at 1-800-FDA-1088. Where should I keep my medicine? Keep out of the reach of children. Store at room temperature between 15 and 25 degrees C (59 and 77 degrees F). Keep container tightly closed. Throw away any unused medicine after the expiration date. NOTE: This sheet is a summary. It may not cover all possible information. If you have questions about this medicine, talk to your doctor, pharmacist, or health care provider.  2021 Elsevier/Gold Standard (2018-01-25 12:22:33)   Gestational Diabetes Mellitus, Self-Care When you have gestational diabetes mellitus, you must make sure your blood sugar (glucose) stays at a healthy level. What are the risks? If you do not get treated for this condition, it may cause problems for you and your unborn baby. For the mother  Giving birth to the baby early.  Having problems during labor and when giving birth.  Needing surgery to give birth to the baby (cesarean delivery).  Having problems with blood pressure.  Getting this form of diabetes again when pregnant.  Getting type 2 diabetes in the future. For the baby  Low blood sugar.  Bigger body size than is normal.  Breathing problems. How to monitor blood sugar Check your blood sugar every day while you are pregnant. Check it as often as told by your doctor. To do this: 1. Wash your hands with soap and water for at least 20 seconds. 2. Prick the side of your finger (not the tip) with the lancet. Use a different finger each time. 3. Gently rub the finger until a small drop of blood appears. 4. Follow instructions that come with your meter for: ? Putting in the test strip. ? Putting blood on the strip. ? Getting the result. 5. Write down your result and any notes. In general, your blood  sugar levels should be:  95 mg/dL (5.3 mmol/L) if you have not eaten.  140 mg/dL (7.8 mmol/L) 1 hour after a meal.  120 mg/dL (6.7 mmol/L) 2 hours after a meal.   Follow these instructions at home: Medicines  Take over-the-counter and prescription medicines only as told by your doctor.  If your doctor prescribed insulin or other diabetes medicines: ? Take them every day. ? Do not run out of insulin or other medicines. Plan ahead so you always have them. Eating and drinking  Follow instructions from your doctor about eating or drinking restrictions.  See a food expert (dietician) to help you create an eating plan that helps control your blood sugar. The foods in this plan will include: ? Low-fat proteins. ? Dried beans, nuts, and whole grain breads, cereals, or pasta. ? Fresh fruits and vegetables. ? Low-fat dairy products. ? Healthy fats.  Eat healthy snacks between healthy meals.  Drink enough fluid to keep your pee (urine) pale yellow.  Keep track of carbs that you eat. To do this: ? Read food labels. ? Learn the serving sizes of foods.  Follow your sick day plan when you cannot eat or drink normally. Make this plan with your doctor so it is ready to use.  Activity  Do exercises as told by your doctor.  Exercise for 30 or more minutes a day, or as much as your doctor recommends. To help you control blood sugar levels after a meal: ? Do 10 minutes of exercise after each meal. ? Start this exercise 30 minutes after the meal.  Talk with your doctor before you start a new exercise. Your doctor may tell you to change your insulin, other medicines, or food. Lifestyle  Do not drink alcohol.  Do not use any products that contain nicotine or tobacco, such as cigarettes, e-cigarettes, and chewing tobacco. If you need help quitting, ask your doctor.  Learn how to deal with stress. If you need help with this, ask your doctor. Body care  Stay up to date with your shots  (vaccines).  Take good care of your teeth. To do this: ? Brush your teeth and gums two times a day. ? Floss one or more times a day. ? Go to the dentist one or more times every 6 months.  Stay at a healthy weight while you are pregnant. General instructions  Ask your doctor about risks of high blood pressure in pregnancy.  Share your diabetes care plan with: ? Your work or school. ? People you live with.  Check your pee for ketones: ? When you are sick. ? As told by your doctor.  Carry a card or wear a bracelet that says you have diabetes.  Keep all follow-up visits. Care after giving birth  Have your blood sugar checked 4-12 weeks after you give birth.  Get checked for diabetes one or more times every 3 years or as told. Where to find more information  American Diabetes Association (ADA): diabetes.org  Association of Diabetes Care & Education Specialists (ADCES): diabeteseducator.org  Centers for Disease Control and Prevention (CDC): TonerPromos.nocdc.gov  American Pregnancy Association: americanpregnancy.org  [place] Department of Agriculture MyPlate: WrestlingReporter.dkmyplate.gov Contact a doctor if:  Your blood sugar is above your target for two tests in a row.  You have a fever.  You are sick for 2 days or more and do not get better.  You have either of these problems for more than 6 hours: ? You vomit every time you eat or drink. ? You have watery poop (diarrhea). Get help right away if:  You cannot think clearly.  You have trouble breathing.  You have moderate or high ketones in your pee.  Blood or abnormal fluid starts to come out of your vagina.  You feel your baby is not moving as usual.  You start having early contractions. You may feel your belly tighten.  You have a very bad headache. These symptoms may be an emergency. Get help right away. Call your local emergency services (911 in the [place]  Do not wait to see if the symptoms will go away.  Do not drive yourself to  the hospital. Summary  Check your blood sugar (glucose) while you are pregnant. Check it as often as told by your doctor.  Take your insulin and diabetes medicines as told.  Have your blood sugar checked 4-12 weeks after you give birth.  Keep all follow-up visits. This information is not intended to replace advice given to you by your health care provider. Make sure you discuss any questions you have with your health care provider. Document Revised: 06/05/2019 Document Reviewed: 06/05/2019 Elsevier Patient Education  2021 Elsevier Inc.   Second Trimester of Pregnancy  The second trimester of pregnancy is from week 13  through week 27. This is also called months 4 through 6 of pregnancy. This is often the time when you feel your best. During the second trimester:  Morning sickness is less or has stopped.  You may have more energy.  You may feel hungry more often. At this time, your unborn baby (fetus) is growing very fast. At the end of the sixth month, the unborn baby may be up to 12 inches long and weigh about 1 pounds. You will likely start to feel the baby move between 16 and 20 weeks of pregnancy. Body changes during your second trimester Your body continues to go through many changes during this time. The changes vary and generally return to normal after the baby is born. Physical changes  You will gain more weight.  You may start to get stretch marks on your hips, belly (abdomen), and breasts.  Your breasts will grow and may hurt.  Dark spots or blotches may develop on your face.  A dark line from your belly button to the pubic area (linea nigra) may appear.  You may have changes in your hair. Health changes  You may have headaches.  You may have heartburn.  You may have trouble pooping (constipation).  You may have hemorrhoids or swollen, bulging veins (varicose veins).  Your gums may bleed.  You may pee (urinate) more often.  You may have back  pain. Follow these instructions at home: Medicines  Take over-the-counter and prescription medicines only as told by your doctor. Some medicines are not safe during pregnancy.  Take a prenatal vitamin that contains at least 600 micrograms (mcg) of folic acid. Eating and drinking  Eat healthy meals that include: ? Fresh fruits and vegetables. ? Whole grains. ? Good sources of protein, such as meat, eggs, or tofu. ? Low-fat dairy products.  Avoid raw meat and unpasteurized juice, milk, and cheese.  You may need to take these actions to prevent or treat trouble pooping: ? Drink enough fluids to keep your pee (urine) pale yellow. ? Eat foods that are high in fiber. These include beans, whole grains, and fresh fruits and vegetables. ? Limit foods that are high in fat and sugar. These include fried or sweet foods. Activity  Exercise only as told by your doctor. Most people can do their usual exercise during pregnancy. Try to exercise for 30 minutes at least 5 days a week.  Stop exercising if you have pain or cramps in your belly or lower back.  Do not exercise if it is too hot or too humid, or if you are in a place of great height (high altitude).  Avoid heavy lifting.  If you choose to, you may have sex unless your doctor tells you not to. Relieving pain and discomfort  Wear a good support bra if your breasts are sore.  Take warm water baths (sitz baths) to soothe pain or discomfort caused by hemorrhoids. Use hemorrhoid cream if your doctor approves.  Rest with your legs raised (elevated) if you have leg cramps or low back pain.  If you develop bulging veins in your legs: ? Wear support hose as told by your doctor. ? Raise your feet for 15 minutes, 3-4 times a day. ? Limit salt in your food. Safety  Wear your seat belt at all times when you are in a car.  Talk with your doctor if someone is hurting you or yelling at you a lot. Lifestyle  Do not use hot tubs, steam rooms,  or saunas.  Do not douche. Do not use tampons or scented sanitary pads.  Avoid cat litter boxes and soil used by cats. These carry germs that can harm your baby and can cause a loss of your baby by miscarriage or stillbirth.  Do not use herbal medicines, illegal drugs, or medicines that are not approved by your doctor. Do not drink alcohol.  Do not smoke or use any products that contain nicotine or tobacco. If you need help quitting, ask your doctor. General instructions  Keep all follow-up visits. This is important.  Ask your doctor about local prenatal classes.  Ask your doctor about the right foods to eat or for help finding a counselor. Where to find more information  American Pregnancy Association: americanpregnancy.org  [employer]: www.acog.org  Office on [employer] Health: MightyReward.co.nz Contact a doctor if:  You have a headache that does not go away when you take medicine.  You have changes in how you see, or you see spots in front of your eyes.  You have mild cramps, pressure, or pain in your lower belly.  You continue to feel like you may vomit (nauseous), you vomit, or you have watery poop (diarrhea).  You have bad-smelling fluid coming from your vagina.  You have pain when you pee or your pee smells bad.  You have very bad swelling of your face, hands, ankles, feet, or legs.  You have a fever. Get help right away if:  You are leaking fluid from your vagina.  You have spotting or bleeding from your vagina.  You have very bad belly cramping or pain.  You have trouble breathing.  You have chest pain.  You faint.  You have not felt your baby move for the time period told by your doctor.  You have new or increased pain, swelling, or redness in an arm or leg. Summary  The second trimester of pregnancy is from week 13 through week 27 (months 4 through 6).  Eat healthy meals.  Exercise as told by your  doctor. Most people can do their usual exercise during pregnancy.  Do not use herbal medicines, illegal drugs, or medicines that are not approved by your doctor. Do not drink alcohol.  Call your doctor if you get sick or if you notice anything unusual about your pregnancy. This information is not intended to replace advice given to you by your health care provider. Make sure you discuss any questions you have with your health care provider. Document Revised: 06/07/2019 Document Reviewed: 04/13/2019 Elsevier Patient Education  2021 Elsevier Inc.   Shingles  Shingles is an infection. It gives you a painful skin rash and blisters that have fluid in them. Shingles is caused by the same germ (virus) that causes chickenpox. Shingles only happens in people who:  Have had chickenpox.  Have been given a shot of medicine (vaccine) to protect against chickenpox. Shingles is rare in this group. The first symptoms of shingles may be itching, tingling, or pain in an area on your skin. A rash will show on your skin a few days or weeks later. The rash is likely to be on one side of your body. The rash usually has a shape like a belt or a band. Over time, the rash turns into fluid-filled blisters. The blisters will break open, change into scabs, and dry up. Medicines may:  Help with pain and itching.  Help you get better sooner.  Help to prevent long-term problems. Follow these  instructions at home: Medicines  Take over-the-counter and prescription medicines only as told by your doctor.  Put on an anti-itch cream or numbing cream where you have a rash, blisters, or scabs. Do this as told by your doctor. Helping with itching and discomfort  Put cold, wet cloths (cold compresses) on the area of the rash or blisters as told by your doctor.  Cool baths can help you feel better. Try adding baking soda or dry oatmeal to the water to lessen itching. Do not bathe in hot water.   Blister and rash  care  Keep your rash covered with a loose bandage (dressing).  Wear loose clothing that does not rub on your rash.  Keep your rash and blisters clean. To do this, wash the area with mild soap and cool water as told by your doctor.  Check your rash every day for signs of infection. Check for: ? More redness, swelling, or pain. ? Fluid or blood. ? Warmth. ? Pus or a bad smell.  Do not scratch your rash. Do not pick at your blisters. To help you to not scratch: ? Keep your fingernails clean and cut short. ? Wear gloves or mittens when you sleep, if scratching is a problem. General instructions  Rest as told by your doctor.  Keep all follow-up visits as told by your doctor. This is important.  Wash your hands often with soap and water. If soap and water are not available, use hand sanitizer. Doing this lowers your chance of getting a skin infection caused by germs (bacteria).  Your infection can cause chickenpox in people who have never had chickenpox or never got a shot of chickenpox vaccine. If you have blisters that did not change into scabs yet, try not to touch other people or be around other people, especially: ? Babies. ? Pregnant women. ? Children who have areas of red, itchy, or rough skin (eczema). ? Very old people who have transplants. ? People who have a long-term (chronic) sickness, like cancer or AIDS. Contact a doctor if:  Your pain does not get better with medicine.  Your pain does not get better after the rash heals.  You have any signs of infection in the rash area. These signs include: ? More redness, swelling, or pain around the rash. ? Fluid or blood coming from the rash. ? The rash area feeling warm to the touch. ? Pus or a bad smell coming from the rash. Get help right away if:  The rash is on your face or nose.  You have pain in your face or pain by your eye.  You lose feeling on one side of your face.  You have trouble seeing.  You have ear  pain, or you have ringing in your ear.  You have a loss of taste.  Your condition gets worse. Summary  Shingles gives you a painful skin rash and blisters that have fluid in them.  Shingles is an infection. It is caused by the same germ (virus) that causes chickenpox.  Keep your rash covered with a loose bandage (dressing). Wear loose clothing that does not rub on your rash.  If you have blisters that did not change into scabs yet, try not to touch other people or be around people. This information is not intended to replace advice given to you by your health care provider. Make sure you discuss any questions you have with your health care provider. Document Revised: 04/22/2018 Document Reviewed: 09/02/2016 Elsevier Patient Education  [place].

## 2020-04-25 NOTE — Progress Notes (Signed)
ROB: Patient has concerns about fetal movement and cramping. She has been diagnosed with shingles.

## 2020-04-25 NOTE — Telephone Encounter (Signed)
Please contact patient. Reschedule appointment for today. Advise the antiviral medication is safe in pregnancy and will send prescription to pharmacy on file. Thanks, JML

## 2020-04-29 ENCOUNTER — Telehealth: Payer: Self-pay | Admitting: Certified Nurse Midwife

## 2020-04-29 NOTE — Telephone Encounter (Signed)
New Message:  Pt states when she was here on Thursday she was told that she would have 2 prescriptions to pick up at the pharmacy. She states she was told a pill and a numbing cream. She states she picked up the pills but she was told by the pharmacy that a cream was never sent.

## 2020-04-29 NOTE — Telephone Encounter (Signed)
Notified patient that [facility] sent in 2 different pills to the pharmacy for her to take. One is for the shingles and the other is for the itching.

## 2020-05-27 ENCOUNTER — Encounter: Payer: Self-pay | Admitting: Certified Nurse Midwife

## 2020-05-27 ENCOUNTER — Telehealth: Payer: Self-pay

## 2020-05-27 ENCOUNTER — Ambulatory Visit (INDEPENDENT_AMBULATORY_CARE_PROVIDER_SITE_OTHER): Payer: BC Managed Care – PPO | Admitting: Certified Nurse Midwife

## 2020-05-27 ENCOUNTER — Other Ambulatory Visit: Payer: Self-pay

## 2020-05-27 VITALS — BP 108/70 | HR 80 | Wt 152.7 lb

## 2020-05-27 DIAGNOSIS — M549 Dorsalgia, unspecified: Secondary | ICD-10-CM

## 2020-05-27 DIAGNOSIS — Z87828 Personal history of other (healed) physical injury and trauma: Secondary | ICD-10-CM

## 2020-05-27 DIAGNOSIS — O99891 Other specified diseases and conditions complicating pregnancy: Secondary | ICD-10-CM

## 2020-05-27 DIAGNOSIS — Z23 Encounter for immunization: Secondary | ICD-10-CM

## 2020-05-27 DIAGNOSIS — Z3A27 27 weeks gestation of pregnancy: Secondary | ICD-10-CM

## 2020-05-27 LAB — POCT URINALYSIS DIPSTICK OB
Bilirubin, UA: NEGATIVE
Blood, UA: NEGATIVE
Glucose, UA: NEGATIVE
Ketones, UA: NEGATIVE
Leukocytes, UA: NEGATIVE
Nitrite, UA: NEGATIVE
Spec Grav, UA: 1.02 (ref 1.010–1.025)
Urobilinogen, UA: 0.2 E.U./dL
pH, UA: 6.5 (ref 5.0–8.0)

## 2020-05-27 MED ORDER — GLYBURIDE 2.5 MG PO TABS: 2.5000 mg | ORAL_TABLET | Freq: Two times a day (BID) | ORAL | 3 refills | Status: DC

## 2020-05-27 MED ORDER — TETANUS-DIPHTH-ACELL PERTUSSIS 5-2.5-18.5 LF-MCG/0.5 IM SUSY
0.5000 mL | PREFILLED_SYRINGE | Freq: Once | INTRAMUSCULAR | Status: AC
  Administered 2020-05-27: 0.5 mL via INTRAMUSCULAR

## 2020-05-27 NOTE — Progress Notes (Signed)
OB-Pt present for routine prenatal care. BTC and Tdap completed.

## 2020-05-27 NOTE — Patient Instructions (Addendum)
Pregnancy and Vaccinations Vaccines can help to keep you healthy. There are some vaccines that should be given before pregnancy and some that should be given during pregnancy. How does this affect me? If you are pregnant or thinking about getting pregnant, talk with your health care provider about what vaccines are right for you. How does this affect my baby? Usually, the benefits of receiving vaccines during pregnancy outweigh the risks of harm to you or your baby if:  The risk of being exposed to a disease is high.  Infection would pose a risk to you or your unborn baby.  The vaccine is not likely to cause harm. Vaccines can help protect your baby from some diseases until he or she is old enough to get the vaccine. What can I do to lower my risk? When you receive the recommended vaccines, it helps to protect you from getting certain diseases and passing them on to your baby. Should I receive vaccines before pregnancy? If possible, make sure that your vaccines are up to date before you become pregnant.  It is safe and important for you to receive weakened viral and weakened bacterial vaccines (inactivated vaccines) as needed before you are pregnant.  Live viral and live bacterial vaccines, such as the measles, mumps, and rubella (MMR) vaccine, should be given 1 month or more before pregnancy. Sometimes, women become pregnant within 1 month of receiving a live vaccine that is not usually recommended during pregnancy. The [place].S. Centers for Disease Control and Prevention (CDC) has reported that when this has happened, vaccines have not harmed pregnant women or their unborn babies. Should I receive vaccines during pregnancy? It is safe and important for you to receive some inactivated vaccines as needed during pregnancy. Until your baby can receive vaccines, your baby will get some protection from diseases through the vaccines that you receive while you are pregnant. During your pregnancy, you  should receive the following:  Influenza vaccine (the flu shot). ? The flu shot may protect you and your baby (up to 67 months of age) from some complications associated with strains of influenza that are covered by the vaccine. ? Pregnant women can receive the flu shot at any time during pregnancy.  Tetanus, diphtheria, and pertussis (Tdap) vaccine. ? The Tdap vaccine will help to prevent whooping cough (pertussis) in you and your baby. ? You should receive 1 dose of this vaccine during each pregnancy. It is recommended that pregnant women receive this vaccine between 27 and 36 weeks of pregnancy.   Should I receive vaccines after pregnancy?  It is safe and important for you to receive vaccines as needed after pregnancy. Some are safe to have if you are breastfeeding. Other vaccines may not be safe to have until after you have stopped breastfeeding.  If you did not receive the Tdap vaccine during your pregnancy and have never received a Tdap vaccine, you should receive that vaccine right after you give birth to your baby (delivery).  If you are not immune to measles, mumps, rubella, or chickenpox (varicella), you should receive those vaccines within days after delivery.  It is important to talk with your health care provider about what vaccines you may need after delivery. What if I am pregnant and I plan to travel internationally? If you are pregnant and you are planning to travel internationally, talk with your health care provider at least 4-6 weeks before your trip. Depending on the country you are planning to visit, you may need to  take special precautions or get certain vaccines to prevent disease. Vaccines that may be recommended for pregnant international travelers include:  Influenza (the flu shot).  Tetanus and diphtheria (Td) or Tdap.  Hepatitis B (HepB).  Hepatitis A (HepA). Your health care provider can help you decide if you need vaccines and if the benefits outweigh the  risk of disease exposure. Follow these instructions at home:  Take over-the-counter and prescription medicines as told by your health care provider.  Keep all follow-up visits as told by your health care provider. This is important. Questions to ask your health care provider:  What vaccines are safe during pregnancy?  What are the risks of vaccines during pregnancy?  What are the potential side effects of vaccines during or after pregnancy?  When should I get vaccines during pregnancy? Contact a health care provider if you:  Believe you have had a reaction to a vaccine.  Have concerns or questions about a vaccine.  Become pregnant within 1 month after you have received a live vaccine. Summary  Vaccines are the most effective way to prevent certain diseases.  Many vaccines are safe to receive during pregnancy.  Some vaccines are recommended during pregnancy to protect you and your baby from getting sick.  If you are pregnant or planning to become pregnant, talk with your health care provider about what vaccines are right for you. This information is not intended to replace advice given to you by your health care provider. Make sure you discuss any questions you have with your health care provider. Document Revised: 06/07/2019 Document Reviewed: 02/02/2018 Elsevier Patient Education  [place]. WHAT OB PATIENTS CAN EXPECT   Confirmation of pregnancy and ultrasound ordered if medically indicated-[redacted] weeks gestation  New OB (NOB) intake with nurse and New OB (NOB) labs- [redacted] weeks gestation  New OB (NOB) physical examination with provider- 11/[redacted] weeks gestation  Flu vaccine-[redacted] weeks gestation  Anatomy scan-[redacted] weeks gestation  Glucose tolerance test, blood work to test for anemia, T-dap vaccine-[redacted] weeks gestation  Vaginal swabs/cultures-STD/Group B strep-[redacted] weeks gestation  Appointments every 4 weeks until 28 weeks  Every 2 weeks from 28 weeks until 36  weeks  Weekly visits from 36 weeks until delivery  Tdap (Tetanus, Diphtheria, Pertussis) Vaccine: What You Need to Know 1. Why get vaccinated? Tdap vaccine can prevent tetanus, diphtheria, and pertussis. Diphtheria and pertussis spread from person to person. Tetanus enters the body through cuts or wounds.  TETANUS (T) causes painful stiffening of the muscles. Tetanus can lead to serious health problems, including being unable to open the mouth, having trouble swallowing and breathing, or death.  DIPHTHERIA (D) can lead to difficulty breathing, heart failure, paralysis, or death.  PERTUSSIS (aP), also known as "whooping cough," can cause uncontrollable, violent coughing that makes it hard to breathe, eat, or drink. Pertussis can be extremely serious especially in babies and young children, causing pneumonia, convulsions, brain damage, or death. In teens and adults, it can cause weight loss, loss of bladder control, passing out, and rib fractures from severe coughing. 2. Tdap vaccine Tdap is only for children 7 years and older, adolescents, and adults.  Adolescents should receive a single dose of Tdap, preferably at age 39 or 55 years. Pregnant people should get a dose of Tdap during every pregnancy, preferably during the early part of the third trimester, to help protect the newborn from pertussis. Infants are most at risk for severe, life-threatening complications from pertussis. Adults who have never received Tdap  should get a dose of Tdap. Also, adults should receive a booster dose of either Tdap or Td (a different vaccine that protects against tetanus and diphtheria but not pertussis) every 10 years, or after 5 years in the case of a severe or dirty wound or burn. Tdap may be given at the same time as other vaccines. 3. Talk with your health care provider Tell your vaccine provider if the person getting the vaccine:  Has had an allergic reaction after a previous dose of any vaccine that  protects against tetanus, diphtheria, or pertussis, or has any severe, life-threatening allergies  Has had a coma, decreased level of consciousness, or prolonged seizures within 7 days after a previous dose of any pertussis vaccine (DTP, DTaP, or Tdap)  Has seizures or another nervous system problem  Has ever had Guillain-Barr Syndrome (also called "GBS")  Has had severe pain or swelling after a previous dose of any vaccine that protects against tetanus or diphtheria In some cases, your health care provider may decide to postpone Tdap vaccination until a future visit. People with minor illnesses, such as a cold, may be vaccinated. People who are moderately or severely ill should usually wait until they recover before getting Tdap vaccine.  Your health care provider can give you more information. 4. Risks of a vaccine reaction  Pain, redness, or swelling where the shot was given, mild fever, headache, feeling tired, and nausea, vomiting, diarrhea, or stomachache sometimes happen after Tdap vaccination. People sometimes faint after medical procedures, including vaccination. Tell your provider if you feel dizzy or have vision changes or ringing in the ears.  As with any medicine, there is a very remote chance of a vaccine causing a severe allergic reaction, other serious injury, or death. 5. What if there is a serious problem? An allergic reaction could occur after the vaccinated person leaves the clinic. If you see signs of a severe allergic reaction (hives, swelling of the face and throat, difficulty breathing, a fast heartbeat, dizziness, or weakness), call 9-1-1 and get the person to the nearest hospital. For other signs that concern you, call your health care provider.  Adverse reactions should be reported to the Vaccine Adverse Event Reporting System (VAERS). Your health care provider will usually file this report, or you can do it yourself. Visit the VAERS website at www.vaers.SamedayNews.es or  call (608)707-4197. VAERS is only for reporting reactions, and VAERS staff members do not give medical advice. 6. The National Vaccine Injury Compensation Program The [employer] Vaccine Injury Compensation Program (VICP) is a federal program that was created to compensate people who may have been injured by certain vaccines. Claims regarding alleged injury or death due to vaccination have a time limit for filing, which may be as short as two years. Visit the VICP website at GoldCloset.com.ee or call 360 622 6897 to learn about the program and about filing a claim. 7. How can I learn more?  Ask your health care provider.  Call your local or state health department.  Visit the website of the Food and Drug Administration (FDA) for vaccine package inserts and additional information at TraderRating.uy.  Contact the Centers for Disease Control and Prevention (CDC): ? Call 205 724 7806 (1-800-CDC-INFO) or ? Visit CDC's website at http://hunter.com/. Vaccine Information Statement Tdap (Tetanus, Diphtheria, Pertussis) Vaccine (08/18/2019) This information is not intended to replace advice given to you by your health care provider. Make sure you discuss any questions you have with your health care provider. Document Revised: 09/13/2019 Document Reviewed: 09/13/2019  Elsevier Patient Education  2021 [place] of Pregnancy  The third trimester of pregnancy is from week 28 through week 33. This is also called months 7 through 9. This trimester is when your unborn baby (fetus) is growing very fast. At the end of the ninth month, the unborn baby is about 20 inches long. It weighs about 6-10 pounds. Body changes during your third trimester Your body continues to go through many changes during this time. The changes vary and generally return to normal after the baby is born. Physical changes  Your weight will continue to increase. You may  gain 25-35 pounds (11-16 kg) by the end of the pregnancy. If you are underweight, you may gain 28-40 lb (about 13-18 kg). If you are overweight, you may gain 15-25 lb (about 7-11 kg).  You may start to get stretch marks on your hips, belly (abdomen), and breasts.  Your breasts will continue to grow and may hurt. A yellow fluid (colostrum) may leak from your breasts. This is the first milk you are making for your baby.  You may have changes in your hair.  Your belly button may stick out.  You may have more swelling in your hands, face, or ankles. Health changes  You may have heartburn.  You may have trouble pooping (constipation).  You may get hemorrhoids. These are swollen veins in the butt that can itch or get painful.  You may have swollen veins (varicose veins) in your legs.  You may have more body aches in the pelvis, back, or thighs.  You may have more tingling or numbness in your hands, arms, and legs. The skin on your belly may also feel numb.  You may feel short of breath as your womb (uterus) gets bigger. Other changes  You may pee (urinate) more often.  You may have more problems sleeping.  You may notice the unborn baby "dropping," or moving lower in your belly.  You may have more discharge coming from your vagina.  Your joints may feel loose, and you may have pain around your pelvic bone. Follow these instructions at home: Medicines  Take over-the-counter and prescription medicines only as told by your doctor. Some medicines are not safe during pregnancy.  Take a prenatal vitamin that contains at least 600 micrograms (mcg) of folic acid. Eating and drinking  Eat healthy meals that include: ? Fresh fruits and vegetables. ? Whole grains. ? Good sources of protein, such as meat, eggs, or tofu. ? Low-fat dairy products.  Avoid raw meat and unpasteurized juice, milk, and cheese. These carry germs that can harm you and your baby.  Eat 4 or 5 small meals  rather than 3 large meals a day.  You may need to take these actions to prevent or treat trouble pooping: ? Drink enough fluids to keep your pee (urine) pale yellow. ? Eat foods that are high in fiber. These include beans, whole grains, and fresh fruits and vegetables. ? Limit foods that are high in fat and sugar. These include fried or sweet foods. Activity  Exercise only as told by your doctor. Stop exercising if you start to have cramps in your womb.  Avoid heavy lifting.  Do not exercise if it is too hot or too humid, or if you are in a place of great height (high altitude).  If you choose to, you may have sex unless your doctor tells you not to. Relieving pain and discomfort  Take breaks often, and rest  with your legs raised (elevated) if you have leg cramps or low back pain.  Take warm water baths (sitz baths) to soothe pain or discomfort caused by hemorrhoids. Use hemorrhoid cream if your doctor approves.  Wear a good support bra if your breasts are tender.  If you develop bulging, swollen veins in your legs: ? Wear support hose as told by your doctor. ? Raise your feet for 15 minutes, 3-4 times a day. ? Limit salt in your food. Safety  Talk to your doctor before traveling far distances.  Do not use hot tubs, steam rooms, or saunas.  Wear your seat belt at all times when you are in a car.  Talk with your doctor if someone is hurting you or yelling at you a lot. Preparing for your baby's arrival To prepare for the arrival of your baby:  Take prenatal classes.  Visit the hospital and tour the maternity area.  Buy a rear-facing car seat. Learn how to install it in your car.  Prepare the baby's room. Take out all pillows and stuffed animals from the baby's crib. General instructions  Avoid cat litter boxes and soil used by cats. These carry germs that can cause harm to the baby and can cause a loss of your baby by miscarriage or stillbirth.  Do not douche or use  tampons. Do not use scented sanitary pads.  Do not smoke or use any products that contain nicotine or tobacco. If you need help quitting, ask your doctor.  Do not drink alcohol.  Do not use herbal medicines, illegal drugs, or medicines that were not approved by your doctor. Chemicals in these products can affect your baby.  Keep all follow-up visits. This is important. Where to find more information  American Pregnancy Association: americanpregnancy.org  [employer]nec[employer]: www.acog.org  Office on Women's Health: KeywordPortfolios.com.br Contact a doctor if:  You have a fever.  You have mild cramps or pressure in your lower belly.  You have a nagging pain in your belly area.  You vomit, or you have watery poop (diarrhea).  You have bad-smelling fluid coming from your vagina.  You have pain when you pee, or your pee smells bad.  You have a headache that does not go away when you take medicine.  You have changes in how you see, or you see spots in front of your eyes. Get help right away if:  Your water breaks.  You have regular contractions that are less than 5 minutes apart.  You are spotting or bleeding from your vagina.  You have very bad belly cramps or pain.  You have trouble breathing.  You have chest pain.  You faint.  You have not felt the baby move for the amount of time told by your doctor.  You have new or increased pain, swelling, or redness in an arm or leg. Summary  The third trimester is from week 28 through week 40 (months 7 through 9). This is the time when your unborn baby is growing very fast.  During this time, your discomfort may increase as you gain weight and as your baby grows.  Get ready for your baby to arrive by taking prenatal classes, buying a rear-facing car seat, and preparing the baby's room.  Get help right away if you are bleeding from your vagina, you have chest pain and trouble breathing,  or you have not felt the baby move for the amount of time told by your doctor. This  information is not intended to replace advice given to you by your health care provider. Make sure you discuss any questions you have with your health care provider. Document Revised: 06/07/2019 Document Reviewed: 04/13/2019 Elsevier Patient Education  [place]. Common Medications Safe in Pregnancy  Acne:      Constipation:  Benzoyl Peroxide     Colace  Clindamycin      Dulcolax Suppository  Topica Erythromycin     Fibercon  Salicylic Acid      Metamucil         Miralax AVOID:        Senakot   Accutane    Cough:  Retin-A       Cough Drops  Tetracycline      Phenergan w/ Codeine if Rx  Minocycline      Robitussin (Plain & DM)  Antibiotics:     Crabs/Lice:  Ceclor       RID  Cephalosporins    AVOID:  E-Mycins      Kwell  Keflex  Macrobid/Macrodantin   Diarrhea:  Penicillin      Kao-Pectate  Zithromax      Imodium AD         PUSH FLUIDS AVOID:       Cipro     Fever:  Tetracycline      Tylenol (Regular or Extra  Minocycline       Strength)  Levaquin      Extra Strength-Do not          Exceed 8 tabs/24 hrs Caffeine:        <29m/day (equiv. To 1 cup of coffee or  approx. 3 12 oz sodas)         Gas: Cold/Hayfever:       Gas-X  Benadryl      Mylicon  Claritin       Phazyme  **Claritin-D        Chlor-Trimeton    Headaches:  Dimetapp      ASA-Free Excedrin  Drixoral-Non-Drowsy     Cold Compress  Mucinex (Guaifenasin)     Tylenol (Regular or Extra  Sudafed/Sudafed-12 Hour     Strength)  **Sudafed PE Pseudoephedrine   Tylenol Cold & Sinus     Vicks Vapor Rub  Zyrtec  **AVOID if Problems With Blood Pressure         Heartburn: Avoid lying down for at least 1 hour after meals  Aciphex      Maalox     Rash:  Milk of Magnesia     Benadryl    Mylanta       1% Hydrocortisone Cream  Pepcid  Pepcid Complete   Sleep  Aids:  Prevacid      Ambien   Prilosec       Benadryl  Rolaids       Chamomile Tea  Tums (Limit 4/day)     Unisom         Tylenol PM         Warm milk-add vanilla or  Hemorrhoids:       Sugar for taste  Anusol/Anusol H.C.  (RX: Analapram 2.5%)  Sugar Substitutes:  Hydrocortisone OTC     Ok in moderation  Preparation H      Tucks        Vaseline lotion applied to tissue with wiping    Herpes:     Throat:  Acyclovir      Oragel  Famvir  Valtrex  Vaccines:         Flu Shot Leg Cramps:       *Gardasil  Benadryl      Hepatitis A         Hepatitis B Nasal Spray:       Pneumovax  Saline Nasal Spray     Polio Booster         Tetanus Nausea:       Tuberculosis test or PPD  Vitamin B6 25 mg TID   AVOID:    Dramamine      *Gardasil  Emetrol       Live Poliovirus  Ginger Root 250 mg QID    MMR (measles, mumps &  High Complex Carbs @ Bedtime    rebella)  Sea Bands-Accupressure    Varicella (Chickenpox)  Unisom 1/2 tab TID     *No known complications           If received before Pain:         Known pregnancy;   Darvocet       Resume series after  Lortab        Delivery  Percocet    Yeast:   Tramadol      Femstat  Tylenol 3      Gyne-lotrimin  Ultram       Monistat  Vicodin           MISC:         All Sunscreens           Hair Coloring/highlights          Insect Repellant's          (Including DEET)         Mystic Tans Breastfeeding  Choosing to breastfeed is one of the best decisions you can make for yourself and your baby. A change in hormones during pregnancy causes your breasts to make breast milk in your milk-producing glands. Hormones prevent breast milk from being released before your baby is born. They also prompt milk flow after birth. Once breastfeeding has begun, thoughts of your baby, as well as his or her sucking or crying, can stimulate the release of milk from your milk-producing glands. Benefits of breastfeeding Research shows that breastfeeding offers many  health benefits for infants and mothers. It also offers a cost-free and convenient way to feed your baby. For your baby  Your first milk (colostrum) helps your baby's digestive system to function better.  Special cells in your milk (antibodies) help your baby to fight off infections.  Breastfed babies are less likely to develop asthma, allergies, obesity, or type 2 diabetes. They are also at lower risk for sudden infant death syndrome (SIDS).  Nutrients in breast milk are better able to meet your baby's needs compared to infant formula.  Breast milk improves your baby's brain development. For you  Breastfeeding helps to create a very special bond between you and your baby.  Breastfeeding is convenient. Breast milk costs nothing and is always available at the correct temperature.  Breastfeeding helps to burn calories. It helps you to lose the weight that you gained during pregnancy.  Breastfeeding makes your uterus return faster to its size before pregnancy. It also slows bleeding (lochia) after you give birth.  Breastfeeding helps to lower your risk of developing type 2 diabetes, osteoporosis, rheumatoid arthritis, cardiovascular disease, and breast, ovarian, uterine, and endometrial cancer later in life. Breastfeeding basics Starting breastfeeding  Find a comfortable place to sit or lie down,  with your neck and back well-supported.  Place a pillow or a rolled-up blanket under your baby to bring him or her to the level of your breast (if you are seated). Nursing pillows are specially designed to help support your arms and your baby while you breastfeed.  Make sure that your baby's tummy (abdomen) is facing your abdomen.  Gently massage your breast. With your fingertips, massage from the outer edges of your breast inward toward the nipple. This encourages milk flow. If your milk flows slowly, you may need to continue this action during the feeding.  Support your breast with 4 fingers  underneath and your thumb above your nipple (make the letter "C" with your hand). Make sure your fingers are well away from your nipple and your baby's mouth.  Stroke your baby's lips gently with your finger or nipple.  When your baby's mouth is open wide enough, quickly bring your baby to your breast, placing your entire nipple and as much of the areola as possible into your baby's mouth. The areola is the colored area around your nipple. ? More areola should be visible above your baby's upper lip than below the lower lip. ? Your baby's lips should be opened and extended outward (flanged) to ensure an adequate, comfortable latch. ? Your baby's tongue should be between his or her lower gum and your breast.  Make sure that your baby's mouth is correctly positioned around your nipple (latched). Your baby's lips should create a seal on your breast and be turned out (everted).  It is common for your baby to suck about 2-3 minutes in order to start the flow of breast milk. Latching Teaching your baby how to latch onto your breast properly is very important. An improper latch can cause nipple pain, decreased milk supply, and poor weight gain in your baby. Also, if your baby is not latched onto your nipple properly, he or she may swallow some air during feeding. This can make your baby fussy. Burping your baby when you switch breasts during the feeding can help to get rid of the air. However, teaching your baby to latch on properly is still the best way to prevent fussiness from swallowing air while breastfeeding. Signs that your baby has successfully latched onto your nipple  Silent tugging or silent sucking, without causing you pain. Infant's lips should be extended outward (flanged).  Swallowing heard between every 3-4 sucks once your milk has started to flow (after your let-down milk reflex occurs).  Muscle movement above and in front of his or her ears while sucking. Signs that your baby has not  successfully latched onto your nipple  Sucking sounds or smacking sounds from your baby while breastfeeding.  Nipple pain. If you think your baby has not latched on correctly, slip your finger into the corner of your baby's mouth to break the suction and place it between your baby's gums. Attempt to start breastfeeding again. Signs of successful breastfeeding Signs from your baby  Your baby will gradually decrease the number of sucks or will completely stop sucking.  Your baby will fall asleep.  Your baby's body will relax.  Your baby will retain a small amount of milk in his or her mouth.  Your baby will let go of your breast by himself or herself. Signs from you  Breasts that have increased in firmness, weight, and size 1-3 hours after feeding.  Breasts that are softer immediately after breastfeeding.  Increased milk volume, as well as a  change in milk consistency and color by the fifth day of breastfeeding.  Nipples that are not sore, cracked, or bleeding. Signs that your baby is getting enough milk  Wetting at least 1-2 diapers during the first 24 hours after birth.  Wetting at least 5-6 diapers every 24 hours for the first week after birth. The urine should be clear or pale yellow by the age of 5 days.  Wetting 6-8 diapers every 24 hours as your baby continues to grow and develop.  At least 3 stools in a 24-hour period by the age of 5 days. The stool should be soft and yellow.  At least 3 stools in a 24-hour period by the age of 7 days. The stool should be seedy and yellow.  No loss of weight greater than 10% of birth weight during the first 3 days of life.  Average weight gain of 4-7 oz (113-198 g) per week after the age of 4 days.  Consistent daily weight gain by the age of 5 days, without weight loss after the age of 2 weeks. After a feeding, your baby may spit up a small amount of milk. This is normal. Breastfeeding frequency and duration Frequent feeding will  help you make more milk and can prevent sore nipples and extremely full breasts (breast engorgement). Breastfeed when you feel the need to reduce the fullness of your breasts or when your baby shows signs of hunger. This is called "breastfeeding on demand." Signs that your baby is hungry include:  Increased alertness, activity, or restlessness.  Movement of the head from side to side.  Opening of the mouth when the corner of the mouth or cheek is stroked (rooting).  Increased sucking sounds, smacking lips, cooing, sighing, or squeaking.  Hand-to-mouth movements and sucking on fingers or hands.  Fussing or crying. Avoid introducing a pacifier to your baby in the first 4-6 weeks after your baby is born. After this time, you may choose to use a pacifier. Research has shown that pacifier use during the first year of a baby's life decreases the risk of sudden infant death syndrome (SIDS). Allow your baby to feed on each breast as long as he or she wants. When your baby unlatches or falls asleep while feeding from the first breast, offer the second breast. Because newborns are often sleepy in the first few weeks of life, you may need to awaken your baby to get him or her to feed. Breastfeeding times will vary from baby to baby. However, the following rules can serve as a guide to help you make sure that your baby is properly fed:  Newborns (babies 8 weeks of age or younger) may breastfeed every 1-3 hours.  Newborns should not go without breastfeeding for longer than 3 hours during the day or 5 hours during the night.  You should breastfeed your baby a minimum of 8 times in a 24-hour period. Breast milk pumping Pumping and storing breast milk allows you to make sure that your baby is exclusively fed your breast milk, even at times when you are unable to breastfeed. This is especially important if you go back to work while you are still breastfeeding, or if you are not able to be present during  feedings. Your lactation consultant can help you find a method of pumping that works best for you and give you guidelines about how long it is safe to store breast milk.      Caring for your breasts while you breastfeed Nipples  can become dry, cracked, and sore while breastfeeding. The following recommendations can help keep your breasts moisturized and healthy:  Avoid using soap on your nipples.  Wear a supportive bra designed especially for nursing. Avoid wearing underwire-style bras or extremely tight bras (sports bras).  Air-dry your nipples for 3-4 minutes after each feeding.  Use only cotton bra pads to absorb leaked breast milk. Leaking of breast milk between feedings is normal.  Use lanolin on your nipples after breastfeeding. Lanolin helps to maintain your skin's normal moisture barrier. Pure lanolin is not harmful (not toxic) to your baby. You may also hand express a few drops of breast milk and gently massage that milk into your nipples and allow the milk to air-dry. In the first few weeks after giving birth, some women experience breast engorgement. Engorgement can make your breasts feel heavy, warm, and tender to the touch. Engorgement peaks within 3-5 days after you give birth. The following recommendations can help to ease engorgement:  Completely empty your breasts while breastfeeding or pumping. You may want to start by applying warm, moist heat (in the shower or with warm, water-soaked hand towels) just before feeding or pumping. This increases circulation and helps the milk flow. If your baby does not completely empty your breasts while breastfeeding, pump any extra milk after he or she is finished.  Apply ice packs to your breasts immediately after breastfeeding or pumping, unless this is too uncomfortable for you. To do this: ? Put ice in a plastic bag. ? Place a towel between your skin and the bag. ? Leave the ice on for 20 minutes, 2-3 times a day.  Make sure that your  baby is latched on and positioned properly while breastfeeding. If engorgement persists after 48 hours of following these recommendations, contact your health care provider or a Science writer. Overall health care recommendations while breastfeeding  Eat 3 healthy meals and 3 snacks every day. Well-nourished mothers who are breastfeeding need an additional 450-500 calories a day. You can meet this requirement by increasing the amount of a balanced diet that you eat.  Drink enough water to keep your urine pale yellow or clear.  Rest often, relax, and continue to take your prenatal vitamins to prevent fatigue, stress, and low vitamin and mineral levels in your body (nutrient deficiencies).  Do not use any products that contain nicotine or tobacco, such as cigarettes and e-cigarettes. Your baby may be harmed by chemicals from cigarettes that pass into breast milk and exposure to secondhand smoke. If you need help quitting, ask your health care provider.  Avoid alcohol.  Do not use illegal drugs or marijuana.  Talk with your health care provider before taking any medicines. These include over-the-counter and prescription medicines as well as vitamins and herbal supplements. Some medicines that may be harmful to your baby can pass through breast milk.  It is possible to become pregnant while breastfeeding. If birth control is desired, ask your health care provider about options that will be safe while breastfeeding your baby. Where to find more information: [employer]: www.llli.org Contact a health care provider if:  You feel like you want to stop breastfeeding or have become frustrated with breastfeeding.  Your nipples are cracked or bleeding.  Your breasts are red, tender, or warm.  You have: ? Painful breasts or nipples. ? A swollen area on either breast. ? A fever or chills. ? Nausea or vomiting. ? Drainage other than breast milk from your nipples.  Your  breasts do not become full before feedings by the fifth day after you give birth.  You feel sad and depressed.  Your baby is: ? Too sleepy to eat well. ? Having trouble sleeping. ? More than 37 week old and wetting fewer than 6 diapers in a 24-hour period. ? Not gaining weight by 31 days of age.  Your baby has fewer than 3 stools in a 24-hour period.  Your baby's skin or the white parts of his or her eyes become yellow. Get help right away if:  Your baby is overly tired (lethargic) and does not want to wake up and feed.  Your baby develops an unexplained fever. Summary  Breastfeeding offers many health benefits for infant and mothers.  Try to breastfeed your infant when he or she shows early signs of hunger.  Gently tickle or stroke your baby's lips with your finger or nipple to allow the baby to open his or her mouth. Bring the baby to your breast. Make sure that much of the areola is in your baby's mouth. Offer one side and burp the baby before you offer the other side.  Talk with your health care provider or lactation consultant if you have questions or you face problems as you breastfeed. This information is not intended to replace advice given to you by your health care provider. Make sure you discuss any questions you have with your health care provider. Document Revised: 03/25/2017 Document Reviewed: 01/31/2016 Elsevier Patient Education  2021 [employer].

## 2020-05-27 NOTE — Telephone Encounter (Signed)
Pt is aware that [facility] has sent in gluburide 2.5 mg to be token twice daily with meal. Pt is aware that the medication has been sent to the pharmacy and to please check with the pharmacy concerning the status of the medication being filled.

## 2020-05-27 NOTE — Progress Notes (Signed)
ROB doing well. Feels good movement. Reviewed blood sugar log. Fasting's: 16/28 elevated. 80-105.  2 hr pp: 80-185.   Dr. Valentino Saxon consulted -pt to start glyburide 2.5 mg BID.    28 wk labs to be done next visit ( no lab today).. T dap done  Blood transfusion consent completed, all questions answered. Pt plans postpartum tubal, consent signed today.  Ready set baby reviewed, see check list for topics covered. Sample birth plan given, will follow up in upcoming visits. Discussed birth control after delivery, information pamphlet given.   Follow up 1 wk with Marcelino Duster for ROB /reviewed of BS log. or sooner if needed.    Doreene Burke, CNM

## 2020-05-28 ENCOUNTER — Ambulatory Visit: Payer: BC Managed Care – PPO | Attending: Maternal & Fetal Medicine

## 2020-05-28 ENCOUNTER — Other Ambulatory Visit: Payer: Self-pay | Admitting: Certified Nurse Midwife

## 2020-05-28 ENCOUNTER — Other Ambulatory Visit: Payer: Self-pay

## 2020-05-28 ENCOUNTER — Ambulatory Visit: Payer: BC Managed Care – PPO | Attending: Maternal & Fetal Medicine | Admitting: Maternal & Fetal Medicine

## 2020-05-28 DIAGNOSIS — O35EXX0 Maternal care for other (suspected) fetal abnormality and damage, fetal genitourinary anomalies, not applicable or unspecified: Secondary | ICD-10-CM

## 2020-05-28 DIAGNOSIS — Z3A00 Weeks of gestation of pregnancy not specified: Secondary | ICD-10-CM | POA: Diagnosis not present

## 2020-05-28 DIAGNOSIS — O358XX0 Maternal care for other (suspected) fetal abnormality and damage, not applicable or unspecified: Secondary | ICD-10-CM

## 2020-05-28 DIAGNOSIS — Z3A27 27 weeks gestation of pregnancy: Secondary | ICD-10-CM | POA: Diagnosis not present

## 2020-05-28 DIAGNOSIS — O09529 Supervision of elderly multigravida, unspecified trimester: Secondary | ICD-10-CM | POA: Diagnosis not present

## 2020-05-28 DIAGNOSIS — Z3689 Encounter for other specified antenatal screening: Secondary | ICD-10-CM | POA: Diagnosis present

## 2020-05-28 DIAGNOSIS — O24415 Gestational diabetes mellitus in pregnancy, controlled by oral hypoglycemic drugs: Secondary | ICD-10-CM | POA: Diagnosis not present

## 2020-05-28 DIAGNOSIS — O09522 Supervision of elderly multigravida, second trimester: Secondary | ICD-10-CM | POA: Diagnosis not present

## 2020-05-28 NOTE — Progress Notes (Deleted)
MFM Brief Consult  Ms. Roeder is a 45 yo G5P3 with an EDD of 08/23/20  Single intrauterine pregnancy here for a detailed anatomy due to advanced maternal age >71 yo and renal pyelectasis seen on an out side exam Normal anatomy with measurements consistent with dates There is good fetal movement and amniotic fluid volume  In addition we discussed starting daily low dose ASA for the prevention of preeclampsia.   I discussed with Ms. Sen  today's finding of left bilateral renal pyelectasis with a measurement of 4.7 mm and 5.8 mm. I discussed the etiology of renal pyelectasis to include normal variant, aneuploidy, ureteropelvic or vesicle junction obstruction and urethrovesicle reflux. Prior to 28 weeks the threshold for abnormal is <5mm but after 28 weeks >7 mm.Todays renal pyelectasis was [facility] Grade 1 appearnace without renal caliectasis. Ms. Swailes has a low risk NIPT.  Secondly, I discussed with Ms. Virl Axe given her age of >40 that there is an increased risk for fetal growth restriction and still birth. Her 1 hr GTT was normal. As such we recommend repeat growth at between 4-6 weeks. We also recommend weekly testing at 36 weeks.  I spent 15 minutes with > 50% in face to face consultation  All questions answered  Novella Olive, MD

## 2020-06-04 ENCOUNTER — Other Ambulatory Visit: Payer: Self-pay

## 2020-06-04 ENCOUNTER — Ambulatory Visit (INDEPENDENT_AMBULATORY_CARE_PROVIDER_SITE_OTHER): Payer: BC Managed Care – PPO | Admitting: Certified Nurse Midwife

## 2020-06-04 ENCOUNTER — Encounter: Payer: Self-pay | Admitting: Certified Nurse Midwife

## 2020-06-04 VITALS — BP 130/80 | HR 80 | Wt 153.5 lb

## 2020-06-04 DIAGNOSIS — Z3A28 28 weeks gestation of pregnancy: Secondary | ICD-10-CM

## 2020-06-04 DIAGNOSIS — Z3403 Encounter for supervision of normal first pregnancy, third trimester: Secondary | ICD-10-CM

## 2020-06-04 NOTE — Patient Instructions (Signed)
Td (Tetanus, Diphtheria) Vaccine: What You Need to Know 1. Why get vaccinated? Td vaccine can prevent tetanus and diphtheria. Tetanus enters the body through cuts or wounds. Diphtheria spreads from person to person.  TETANUS (T) causes painful stiffening of the muscles. Tetanus can lead to serious health problems, including being unable to open the mouth, having trouble swallowing and breathing, or death.  DIPHTHERIA (D) can lead to difficulty breathing, heart failure, paralysis, or death. 2. Td vaccine Td is only for children 7 years and older, adolescents, and adults.  Td is usually given as a booster dose every 10 years, or after 5 years in the case of a severe or dirty wound or burn. Another vaccine, called "Tdap," may be used instead of Td. Tdap protects against pertussis, also known as "whooping cough," in addition to tetanus and diphtheria. Td may be given at the same time as other vaccines. 3. Talk with your health care provider Tell your vaccination provider if the person getting the vaccine:  Has had an allergic reaction after a previous dose of any vaccine that protects against tetanus or diphtheria, or has any severe, life-threatening allergies  Has ever had Guillain-Barr Syndrome (also called "GBS")  Has had severe pain or swelling after a previous dose of any vaccine that protects against tetanus or diphtheria In some cases, your health care provider may decide to postpone Td vaccination until a future visit. People with minor illnesses, such as a cold, may be vaccinated. People who are moderately or severely ill should usually wait until they recover before getting Td vaccine.  Your health care provider can give you more information. 4. Risks of a vaccine reaction  Pain, redness, or swelling where the shot was given, mild fever, headache, feeling tired, and nausea, vomiting, diarrhea, or stomachache sometimes happen after Td vaccination. People sometimes faint after medical  procedures, including vaccination. Tell your provider if you feel dizzy or have vision changes or ringing in the ears.  As with any medicine, there is a very remote chance of a vaccine causing a severe allergic reaction, other serious injury, or death. 5. What if there is a serious problem? An allergic reaction could occur after the vaccinated person leaves the clinic. If you see signs of a severe allergic reaction (hives, swelling of the face and throat, difficulty breathing, a fast heartbeat, dizziness, or weakness), call 9-1-1 and get the person to the nearest hospital.  For other signs that concern you, call your health care provider.  Adverse reactions should be reported to the Vaccine Adverse Event Reporting System (VAERS). Your health care provider will usually file this report, or you can do it yourself. Visit the VAERS website at www.vaers.hhs.gov or call 1-800-822-7967. VAERS is only for reporting reactions, and VAERS staff members do not give medical advice. 6. The National Vaccine Injury Compensation Program The National Vaccine Injury Compensation Program (VICP) is a federal program that was created to compensate people who may have been injured by certain vaccines. Claims regarding alleged injury or death due to vaccination have a time limit for filing, which may be as short as two years. Visit the VICP website at www.hrsa.gov/vaccinecompensation or call 1-800-338-2382 to learn about the program and about filing a claim. 7. How can I learn more?  Ask your health care provider.  Call your local or state health department.  Visit the website of the Food and Drug Administration (FDA) for vaccine package inserts and additional information at www.fda.gov/vaccines-blood-biologics/vaccines.  Contact the Centers for   Disease Control and Prevention (CDC): ? Call 1-800-232-4636 (1-800-CDC-INFO) or ? Visit CDC's website at www.cdc.gov/vaccines. Vaccine Information Statement Td (Tetanus,  Diphtheria) Vaccine (08/18/2019) This information is not intended to replace advice given to you by your health care provider. Make sure you discuss any questions you have with your health care provider. Document Revised: 10/05/2019 Document Reviewed: 10/05/2019 Elsevier Patient Education  2021 [employer] Inc[employer]  

## 2020-06-04 NOTE — Progress Notes (Addendum)
ROB doing well. Feels good movement. 28 wk labs today: RPR/CBC. Reviewed BS log now that pt is on glyburide 2.5 mg BID. She states she had 2 episode that sugar got low and she did not feel well. Discussed carrying a snack with her in case that occurs. She verbalizes and agrees. To plan. Log reviewed fasting's one low 66, 78-108 ( 2 elevated), 2 hr PP 68-196. Dr.Cherry consults. Discussed with pt importance of following the diet Pt given the option to follow diet better or increase her morning dose to 5 mg.. Pt states that she will do better with following the diet. Given that pt agrees to modify diet we will give her another week , follow up 1 wk for Blood sugar log review.  Follow up 1 wk with Yesenia Davis for ROB or sooner if needed.    Yesenia Davis, CNM

## 2020-06-04 NOTE — Progress Notes (Signed)
ROB: Patient has concerns about her blood sugar levels being to low. They have been in the range of 63-66. She feels very shaky and dizzy.

## 2020-06-05 LAB — CBC
Hematocrit: 36.9 % (ref 34.0–46.6)
Hemoglobin: 12.2 g/dL (ref 11.1–15.9)
MCH: 29.3 pg (ref 26.6–33.0)
MCHC: 33.1 g/dL (ref 31.5–35.7)
MCV: 89 fL (ref 79–97)
Platelets: 280 10*3/uL (ref 150–450)
RBC: 4.17 x10E6/uL (ref 3.77–5.28)
RDW: 12.5 % (ref 11.7–15.4)
WBC: 8.3 10*3/uL (ref 3.4–10.8)

## 2020-06-05 LAB — RPR: RPR Ser Ql: NONREACTIVE

## 2020-06-12 ENCOUNTER — Telehealth: Payer: Self-pay | Admitting: Certified Nurse Midwife

## 2020-06-12 NOTE — Telephone Encounter (Signed)
Yesenia Davis called in and states that on Saturday she had a really bad leg cramp in her left leg.  She states that after that her leg is really sore and hurts when she applies pressure.  Patient states it still hurts today and now she is starting to feel pain in her left arm as well.  Patient wants to know if this is normal?  Please advise.

## 2020-06-12 NOTE — Telephone Encounter (Signed)
Called patient to give recommendation about leg pain. She has been having som sob and lightheadedness. She verbalized understanding and will just wait until appointment tomorrow.

## 2020-06-13 ENCOUNTER — Encounter: Payer: Self-pay | Admitting: Certified Nurse Midwife

## 2020-06-13 ENCOUNTER — Other Ambulatory Visit: Payer: Self-pay

## 2020-06-13 ENCOUNTER — Ambulatory Visit (INDEPENDENT_AMBULATORY_CARE_PROVIDER_SITE_OTHER): Payer: BC Managed Care – PPO | Admitting: Certified Nurse Midwife

## 2020-06-13 VITALS — BP 112/74 | HR 97 | Wt 152.5 lb

## 2020-06-13 DIAGNOSIS — O99891 Other specified diseases and conditions complicating pregnancy: Secondary | ICD-10-CM

## 2020-06-13 DIAGNOSIS — O26813 Pregnancy related exhaustion and fatigue, third trimester: Secondary | ICD-10-CM

## 2020-06-13 DIAGNOSIS — M5431 Sciatica, right side: Secondary | ICD-10-CM

## 2020-06-13 DIAGNOSIS — O24415 Gestational diabetes mellitus in pregnancy, controlled by oral hypoglycemic drugs: Secondary | ICD-10-CM

## 2020-06-13 DIAGNOSIS — R252 Cramp and spasm: Secondary | ICD-10-CM

## 2020-06-13 DIAGNOSIS — Z3403 Encounter for supervision of normal first pregnancy, third trimester: Secondary | ICD-10-CM

## 2020-06-13 DIAGNOSIS — Z3A29 29 weeks gestation of pregnancy: Secondary | ICD-10-CM

## 2020-06-13 DIAGNOSIS — R0602 Shortness of breath: Secondary | ICD-10-CM

## 2020-06-13 DIAGNOSIS — O26899 Other specified pregnancy related conditions, unspecified trimester: Secondary | ICD-10-CM

## 2020-06-13 LAB — POCT URINALYSIS DIPSTICK OB
Bilirubin, UA: NEGATIVE
Glucose, UA: NEGATIVE
Ketones, UA: NEGATIVE
Leukocytes, UA: NEGATIVE
Nitrite, UA: NEGATIVE
POC,PROTEIN,UA: NEGATIVE
Spec Grav, UA: 1.015 (ref 1.010–1.025)
Urobilinogen, UA: 0.2 E.U./dL
pH, UA: 6.5 (ref 5.0–8.0)

## 2020-06-13 LAB — GLUCOSE, POCT (MANUAL RESULT ENTRY): POC Glucose: 104 mg/dl — AB (ref 70–99)

## 2020-06-13 MED ORDER — MAGNESIUM OXIDE 400 MG PO CAPS: 1.0000 | ORAL_CAPSULE | Freq: Two times a day (BID) | ORAL | 1 refills | Status: DC

## 2020-06-13 NOTE — Progress Notes (Signed)
ROB: She has been having some SOB and fatigue. She also complains of bilateral leg pain that started yesterday, but it had gradually improved.

## 2020-06-13 NOTE — Progress Notes (Signed)
ROB-Reports SOB, post-activity fatigue, leg cramps and lightheadedness. O2 sat 97%, bilateral negative homan sign. Denies tachycardia or chest pain. Declines cardiology consult at this time. Discussed home treatment measures for symptoms. Rx Magnesium oxide, see orders. [facility] handout provided. Birth plan reviewed and signed, see chart. Blood sugar log with 27% elevated per Dr. Valentino Saxon patient may remain at current glyburide dose. Anticipatory guidance regarding dose of prenatal care. Reviewed red flag symptoms and when to call. RTC x 2 weeks for ROB with ANNIE or sooner if needed.

## 2020-06-19 ENCOUNTER — Other Ambulatory Visit: Payer: Self-pay | Admitting: Certified Nurse Midwife

## 2020-06-19 DIAGNOSIS — O24415 Gestational diabetes mellitus in pregnancy, controlled by oral hypoglycemic drugs: Secondary | ICD-10-CM

## 2020-06-19 DIAGNOSIS — O35EXX0 Maternal care for other (suspected) fetal abnormality and damage, fetal genitourinary anomalies, not applicable or unspecified: Secondary | ICD-10-CM

## 2020-06-19 DIAGNOSIS — O09523 Supervision of elderly multigravida, third trimester: Secondary | ICD-10-CM

## 2020-06-21 ENCOUNTER — Other Ambulatory Visit: Payer: Self-pay

## 2020-06-21 NOTE — Progress Notes (Signed)
MFM Brief Consult  [name] is a 44 yo G5P3 with an EDD of 08/23/20  Single intrauterine pregnancy here for a detailed anatomy due to advanced maternal age >40 yo and renal pyelectasis seen on an out side exam Normal anatomy with measurements consistent with dates There is good fetal movement and amniotic fluid volume  In addition we discussed starting daily low dose ASA for the prevention of preeclampsia.   I discussed with [name]  today's finding of left bilateral renal pyelectasis with a measurement of 4.7 mm and 5.8 mm. I discussed the etiology of renal pyelectasis to include normal variant, aneuploidy, ureteropelvic or vesicle junction obstruction and urethrovesicle reflux. Prior to 28 weeks the threshold for abnormal is <4mm but after 28 weeks >7 mm.Todays renal pyelectasis was [facility] Grade 1 appearnace without renal caliectasis. [name] has a low risk NIPT.  Secondly, I discussed with [name] given her age of >40 that there is an increased risk for fetal growth restriction and still birth. Her 1 hr GTT was normal. As such we recommend repeat growth at between 4-6 weeks. We also recommend weekly testing at 36 weeks.  I spent 15 minutes with > 50% in face to face consultation  All questions answered  Ruqaya Strauss J. Xia Stohr, MD 

## 2020-06-24 ENCOUNTER — Other Ambulatory Visit: Payer: Self-pay

## 2020-06-24 ENCOUNTER — Encounter
Admission: RE | Admit: 2020-06-24 | Discharge: 2020-06-24 | Disposition: A | Discharge status: Home / Self Care | Payer: BC Managed Care – PPO | Source: Ambulatory Visit | Attending: Anesthesiology | Admitting: Anesthesiology

## 2020-06-24 NOTE — Consult Note (Signed)
[facility] Anesthesia Consultation  Vallie Fayette QXI:[id number] DOB: [birth year] DOA: 06/24/2020 PCP: [facility] [place]   Requesting physician: Dani Gobble, CNM Date of consultation: 06/24/20 Reason for consultation: Hx of back injury  CHIEF COMPLAINT:  Pregnancy  HISTORY OF PRESENT ILLNESS: Williemae Muriel  is a 45 y.o. female with a known history of back injury as a child and describes chronic back problems. She has had 3 prior vaginal deliveries, has never had an epidural for labor analgesia.   PAST MEDICAL HISTORY:   Past Medical History:  Diagnosis Date   Acne vulgaris    Epigastric pain    Gastritis    GERD (gastroesophageal reflux disease)    Gestational diabetes    Migraines     PAST SURGICAL HISTORY:  Past Surgical History:  Procedure Laterality Date   CHOLECYSTECTOMY  1996   cyst removed     from neck- b9   DILATION AND CURETTAGE OF UTERUS      SOCIAL HISTORY:  Social History   Tobacco Use   Smoking status: Never   Smokeless tobacco: Never  Substance Use Topics   Alcohol use: No    FAMILY HISTORY:  Family History  Problem Relation Age of Onset   Breast cancer Maternal Aunt        65's   Diabetes Father    Diabetes Mother    Diabetes Sister    Ovarian cancer Neg Hx    Colon cancer Neg Hx    Heart disease Neg Hx     DRUG ALLERGIES:  Allergies  Allergen Reactions   Ciprofloxacin Hcl Rash    REVIEW OF SYSTEMS:   RESPIRATORY: No cough, shortness of breath, wheezing.  CARDIOVASCULAR: No chest pain, orthopnea, edema.  HEMATOLOGY: No anemia, easy bruising or bleeding SKIN: No rash or lesion. NEUROLOGIC: No tingling, numbness, weakness.  PSYCHIATRY: No anxiety or depression.   MEDICATIONS AT HOME:  Prior to Admission medications   Medication Sig Start Date End Date Taking? Authorizing Provider  Accu-Chek Softclix Lancets lancets Use as instructed 03/05/20   Verdene Rio, Lara Mulch, CNM  aspirin EC 81 MG tablet Take 1 tablet (81 mg total) by mouth daily. Take after 12 weeks for prevention of preeclampssia later in pregnancy 02/08/20   Diona Fanti, CNM  blood glucose meter kit and supplies KIT Can be generic 02/29/20   Diona Fanti, CNM  Blood Glucose Monitoring Suppl (ACCU-CHEK GUIDE) w/Device KIT Use as directed 02/29/20   Diona Fanti, CNM  [facility] PURELAX 17 GM/SCOOP powder TAKE 255 G BY MOUTH ONCE FOR 1 DOSE. TO USE EVERY THIRD DAY IN NO BM OCCURS SPONTANEOUSLY 02/29/20   Lawhorn, Lara Mulch, CNM  docusate sodium (COLACE) 100 MG capsule Take 1 capsule (100 mg total) by mouth 2 (two) times daily as needed. 02/29/20   Diona Fanti, CNM  glucose blood (ACCU-CHEK SMARTVIEW) test strip Use as instructed to check blood sugars 02/29/20   Lawhorn, Lara Mulch, CNM  glyBURIDE (DIABETA) 2.5 MG tablet Take 1 tablet (2.5 mg total) by mouth 2 (two) times daily with a meal. 05/27/20   Philip Aspen, CNM  Magnesium Oxide 400 MG CAPS Take 1 capsule (400 mg total) by mouth in the morning and at bedtime. 06/13/20   Lawhorn, Lara Mulch, CNM  Prenatal 27-1 MG TABS TAKE 1 TABLET DAILY WHILE TRYING TO CONCEIVE, PREGNANT AND BREASTFEEDING. 04/01/20   [provider]  valACYclovir (VALTREX) 1000 MG tablet TAKE 1 TABLET (1,000 MG TOTAL)  BY MOUTH 3 (THREE) TIMES DAILY FOR 10 DAYS 04/23/20   [provider]  valACYclovir (VALTREX) 500 MG tablet Take two tablets by mouth twice daily for ten days; then one tablet twice daily for remainder of pregnancy 04/25/20   Verdene Rio, Lara Mulch, CNM  VITAMIN D-1000 MAX ST 25 MCG (1000 UT) tablet Take 1,000 Units by mouth daily. 05/03/19   [provider]      PHYSICAL EXAMINATION:   VITAL SIGNS: Last menstrual period 11/13/2019.  GENERAL:  45 y.o.-year-old patient no acute distress.  HEENT: Head atraumatic, normocephalic.  LUNGS: Normal breath sounds bilaterally,  no wheezing, rales,rhonchi. No use of accessory muscles of respiration.  CARDIOVASCULAR: S1, S2 normal. No murmurs, rubs, or gallops.  EXTREMITIES: No pedal edema, cyanosis, or clubbing.  NEUROLOGIC: normal gait PSYCHIATRIC: The patient is alert and oriented x 3.  SKIN: No obvious rash, lesion, or ulcer.    IMPRESSION AND PLAN:   Renia Mikelson  is a 45 y.o. female presenting at [redacted] weeks gestation with hx of back injury.  She has not had an epidural for any of her prior deliveries. Unsure that she would be interested in an epidural for this delivery. We discussed various options for labor analgesia including IV pain medication, self administered nitrous oxide and epidural analgesia. Reassured her that a hx of back problems does not preclude her from being able to have an effective epidural. Also that having an epidural would not cause any worsening of any of her back problems.

## 2020-06-27 ENCOUNTER — Encounter: Payer: Self-pay | Admitting: Certified Nurse Midwife

## 2020-06-27 ENCOUNTER — Other Ambulatory Visit: Payer: Self-pay

## 2020-06-27 ENCOUNTER — Ambulatory Visit (INDEPENDENT_AMBULATORY_CARE_PROVIDER_SITE_OTHER): Payer: BC Managed Care – PPO | Admitting: Certified Nurse Midwife

## 2020-06-27 VITALS — BP 103/70 | HR 73 | Wt 151.8 lb

## 2020-06-27 DIAGNOSIS — O09523 Supervision of elderly multigravida, third trimester: Secondary | ICD-10-CM

## 2020-06-27 DIAGNOSIS — O24415 Gestational diabetes mellitus in pregnancy, controlled by oral hypoglycemic drugs: Secondary | ICD-10-CM

## 2020-06-27 DIAGNOSIS — Z3A31 31 weeks gestation of pregnancy: Secondary | ICD-10-CM

## 2020-06-27 LAB — POCT URINALYSIS DIPSTICK OB
Bilirubin, UA: NEGATIVE
Glucose, UA: NEGATIVE
Ketones, UA: NEGATIVE
Leukocytes, UA: NEGATIVE
Nitrite, UA: NEGATIVE
Spec Grav, UA: 1.025 (ref 1.010–1.025)
Urobilinogen, UA: 0.2 E.U./dL
pH, UA: 6 (ref 5.0–8.0)

## 2020-06-27 NOTE — Patient Instructions (Signed)
Fetal Movement Counts Patient Name: ________________________________________________ Patient DueDate: ____________________ What is a fetal movement count?  A fetal movement count is the number of times that you feel your baby move during a certain amount of time. This may also be called a fetal kick count. A fetal movement count is recommended for every pregnant woman. You may be askedto start counting fetal movements as early as week 28 of your pregnancy. Pay attention to when your baby is most active. You may notice your baby's sleep and wake cycles. You may also notice things that make your baby move more. You should do a fetal movement count: When your baby is normally most active. At the same time each day. A good time to count movements is while you are resting, after having somethingto eat and drink. How do I count fetal movements? Find a quiet, comfortable area. Sit, or lie down on your side. Write down the date, the start time and stop time, and the number of movements that you felt between those two times. Take this information with you to your health care visits. Write down your start time when you feel the first movement. Count kicks, flutters, swishes, rolls, and jabs. You should feel at least 10 movements. You may stop counting after you have felt 10 movements, or if you have been counting for 2 hours. Write down the stop time. If you do not feel 10 movements in 2 hours, contact your health care provider for further instructions. Your health care provider may want to do additional tests to assess your baby's well-being. Contact a health care provider if: You feel fewer than 10 movements in 2 hours. Your baby is not moving like he or she usually does. Date: ____________ Start time: ____________ Stop time: ____________ Movements:____________ Date: ____________ Start time: ____________ Stop time: ____________ Movements:____________ Date: ____________ Start time: ____________ Stop  time: ____________ Movements:____________ Date: ____________ Start time: ____________ Stop time: ____________ Movements:____________ Date: ____________ Start time: ____________ Stop time: ____________ Movements:____________ Date: ____________ Start time: ____________ Stop time: ____________ Movements:____________ Date: ____________ Start time: ____________ Stop time: ____________ Movements:____________ Date: ____________ Start time: ____________ Stop time: ____________ Movements:____________ Date: ____________ Start time: ____________ Stop time: ____________ Movements:____________ This information is not intended to replace advice given to you by your health care provider. Make sure you discuss any questions you have with your healthcare provider. Document Revised: 08/18/2018 Document Reviewed: 08/18/2018 Elsevier Patient Education  2022 Elsevier Inc.  

## 2020-06-27 NOTE — Progress Notes (Signed)
ROB-Doing well. Scheduled for growth ultrasound on 07/02/2020 with MFM, see chart. Blood sugar log WNL, continue medication as prescribed. Anticipatory guidance regarding course of prenatal care. Reviewed red flag symptoms and when to call. RTC x 2 weeks for ROB with ANNIE or sooner if needed.

## 2020-06-27 NOTE — Progress Notes (Signed)
ROB: Doing well, no concerns. 

## 2020-07-02 ENCOUNTER — Ambulatory Visit: Payer: BC Managed Care – PPO | Attending: Maternal & Fetal Medicine

## 2020-07-02 ENCOUNTER — Other Ambulatory Visit: Payer: Self-pay

## 2020-07-02 DIAGNOSIS — Z3A32 32 weeks gestation of pregnancy: Secondary | ICD-10-CM | POA: Diagnosis not present

## 2020-07-02 DIAGNOSIS — O09523 Supervision of elderly multigravida, third trimester: Secondary | ICD-10-CM | POA: Diagnosis not present

## 2020-07-02 DIAGNOSIS — O358XX0 Maternal care for other (suspected) fetal abnormality and damage, not applicable or unspecified: Secondary | ICD-10-CM | POA: Insufficient documentation

## 2020-07-02 DIAGNOSIS — O24415 Gestational diabetes mellitus in pregnancy, controlled by oral hypoglycemic drugs: Secondary | ICD-10-CM | POA: Insufficient documentation

## 2020-07-02 DIAGNOSIS — O35EXX0 Maternal care for other (suspected) fetal abnormality and damage, fetal genitourinary anomalies, not applicable or unspecified: Secondary | ICD-10-CM

## 2020-07-06 ENCOUNTER — Other Ambulatory Visit: Payer: Self-pay | Admitting: Certified Nurse Midwife

## 2020-07-09 ENCOUNTER — Other Ambulatory Visit: Payer: BC Managed Care – PPO

## 2020-07-09 ENCOUNTER — Encounter: Payer: Self-pay | Admitting: Certified Nurse Midwife

## 2020-07-09 ENCOUNTER — Other Ambulatory Visit: Payer: Self-pay

## 2020-07-09 ENCOUNTER — Ambulatory Visit (INDEPENDENT_AMBULATORY_CARE_PROVIDER_SITE_OTHER): Payer: BC Managed Care – PPO | Admitting: Certified Nurse Midwife

## 2020-07-09 VITALS — BP 105/70 | HR 80 | Wt 154.1 lb

## 2020-07-09 DIAGNOSIS — Z3403 Encounter for supervision of normal first pregnancy, third trimester: Secondary | ICD-10-CM | POA: Diagnosis not present

## 2020-07-09 DIAGNOSIS — Z3A33 33 weeks gestation of pregnancy: Secondary | ICD-10-CM

## 2020-07-09 DIAGNOSIS — O24415 Gestational diabetes mellitus in pregnancy, controlled by oral hypoglycemic drugs: Secondary | ICD-10-CM

## 2020-07-09 LAB — POCT URINALYSIS DIPSTICK
Bilirubin, UA: NEGATIVE
Blood, UA: NEGATIVE
Glucose, UA: NEGATIVE
Ketones, UA: NEGATIVE
Leukocytes, UA: NEGATIVE
Nitrite, UA: NEGATIVE
Protein, UA: POSITIVE — AB
Spec Grav, UA: 1.025 (ref 1.010–1.025)
Urobilinogen, UA: 0.2 E.U./dL
pH, UA: 6 (ref 5.0–8.0)

## 2020-07-09 MED ORDER — GLYBURIDE 2.5 MG PO TABS: 2.5000 mg | ORAL_TABLET | Freq: Every day | ORAL | 2 refills | Status: DC

## 2020-07-09 MED ORDER — GLYBURIDE 5 MG PO TABS: 5.0000 mg | ORAL_TABLET | Freq: Every day | ORAL | 1 refills | Status: DC

## 2020-07-09 NOTE — Patient Instructions (Signed)
Fetal Movement Counts Patient Name: ________________________________________________ Patient DueDate: ____________________ What is a fetal movement count?  A fetal movement count is the number of times that you feel your baby move during a certain amount of time. This may also be called a fetal kick count. A fetal movement count is recommended for every pregnant woman. You may be askedto start counting fetal movements as early as week 28 of your pregnancy. Pay attention to when your baby is most active. You may notice your baby's sleep and wake cycles. You may also notice things that make your baby move more. You should do a fetal movement count: When your baby is normally most active. At the same time each day. A good time to count movements is while you are resting, after having somethingto eat and drink. How do I count fetal movements? Find a quiet, comfortable area. Sit, or lie down on your side. Write down the date, the start time and stop time, and the number of movements that you felt between those two times. Take this information with you to your health care visits. Write down your start time when you feel the first movement. Count kicks, flutters, swishes, rolls, and jabs. You should feel at least 10 movements. You may stop counting after you have felt 10 movements, or if you have been counting for 2 hours. Write down the stop time. If you do not feel 10 movements in 2 hours, contact your health care provider for further instructions. Your health care provider may want to do additional tests to assess your baby's well-being. Contact a health care provider if: You feel fewer than 10 movements in 2 hours. Your baby is not moving like he or she usually does. Date: ____________ Start time: ____________ Stop time: ____________ Movements:____________ Date: ____________ Start time: ____________ Stop time: ____________ Movements:____________ Date: ____________ Start time: ____________ Stop  time: ____________ Movements:____________ Date: ____________ Start time: ____________ Stop time: ____________ Movements:____________ Date: ____________ Start time: ____________ Stop time: ____________ Movements:____________ Date: ____________ Start time: ____________ Stop time: ____________ Movements:____________ Date: ____________ Start time: ____________ Stop time: ____________ Movements:____________ Date: ____________ Start time: ____________ Stop time: ____________ Movements:____________ Date: ____________ Start time: ____________ Stop time: ____________ Movements:____________ This information is not intended to replace advice given to you by your health care provider. Make sure you discuss any questions you have with your healthcare provider. Document Revised: 08/18/2018 Document Reviewed: 08/18/2018 Elsevier Patient Education  2022 Elsevier Inc.  

## 2020-07-09 NOTE — Progress Notes (Signed)
ROB: She has been having some pressure that radiates into her right upper calf.

## 2020-07-09 NOTE — Progress Notes (Signed)
ROB and NST for AMA, GDM with meds. BS log not available pt state she forgot to bring it. She said her fasting BS ranging 80-90 , 2 hr pp 120-130 mostly.  Dr. Valentino Saxon consulted glyburide morning dose increased to 5 mg. PM dose 2.5 mg.   She has growth u/s scheduled with MFM 8/2. Discussed plan for Midwifery service, pt aware that MD may be present for her delivery . She request to have a visit to meet with them . Pt encourage to schedule her next appointment with one of them. She agrees.    NST: reactive , category 1  Baseline: 130 Moderate variability Accelerations present Decelerations absent CTX: none

## 2020-07-10 ENCOUNTER — Other Ambulatory Visit: Payer: Self-pay | Admitting: Certified Nurse Midwife

## 2020-07-10 ENCOUNTER — Telehealth: Payer: Self-pay | Admitting: Certified Nurse Midwife

## 2020-07-10 NOTE — Telephone Encounter (Signed)
Patient states that she got a new RX to take in the morning.  Her question is the one that she normally takes at night..inhaled corticosteroids she suppose to continue that or does she stop that one

## 2020-07-10 NOTE — Telephone Encounter (Signed)
Spoke with patient confirming that she should take 5mg  glyBURIDE in the am and her usual 2.5mg  pm. No further questions from patient.

## 2020-07-17 ENCOUNTER — Other Ambulatory Visit: Payer: BC Managed Care – PPO

## 2020-07-17 ENCOUNTER — Other Ambulatory Visit: Payer: Self-pay

## 2020-07-17 ENCOUNTER — Encounter: Payer: Self-pay | Admitting: Obstetrics and Gynecology

## 2020-07-17 ENCOUNTER — Ambulatory Visit (INDEPENDENT_AMBULATORY_CARE_PROVIDER_SITE_OTHER): Payer: BC Managed Care – PPO | Admitting: Obstetrics and Gynecology

## 2020-07-17 VITALS — BP 108/70 | HR 65 | Wt 152.5 lb

## 2020-07-17 DIAGNOSIS — O09523 Supervision of elderly multigravida, third trimester: Secondary | ICD-10-CM | POA: Diagnosis not present

## 2020-07-17 DIAGNOSIS — Z3A34 34 weeks gestation of pregnancy: Secondary | ICD-10-CM

## 2020-07-17 DIAGNOSIS — Z3483 Encounter for supervision of other normal pregnancy, third trimester: Secondary | ICD-10-CM | POA: Diagnosis not present

## 2020-07-17 DIAGNOSIS — O24415 Gestational diabetes mellitus in pregnancy, controlled by oral hypoglycemic drugs: Secondary | ICD-10-CM | POA: Diagnosis not present

## 2020-07-17 LAB — POCT URINALYSIS DIPSTICK OB
Bilirubin, UA: NEGATIVE
Blood, UA: NEGATIVE
Glucose, UA: NEGATIVE
Ketones, UA: NEGATIVE
Leukocytes, UA: NEGATIVE
Nitrite, UA: NEGATIVE
POC,PROTEIN,UA: NEGATIVE
Spec Grav, UA: 1.015 (ref 1.010–1.025)
Urobilinogen, UA: 0.2 E.U./dL
pH, UA: 7 (ref 5.0–8.0)

## 2020-07-17 NOTE — Progress Notes (Signed)
ROB: Patient presents from midwifery side for "meet and greet" of the MDs. Has h/o GDM on Glyburide.  Blood sugars reviewed, overall wnl except 1 fasting of 109 and postprandial dinner of 188 (however was on a holiday so was not as compliant with diet that day).  Overall doing well.  Has questions about one of her medications (Valtrex). Notes she was prescribed for a shingles outbreak several months ago but is no longer taking. Wonders why she was told to take for the remainder of her pregnancy as she was unaware. Advised that it was not necessary and does not have to resume.  NST performed today was reviewed and was found to be reactive.  Continue recommended antenatal testing and prenatal care. Is scheduled for growth scan in 4 weeks (earliest available).    NONSTRESS TEST INTERPRETATION  INDICATIONS: Gestational DM on Glyburide  FHR baseline: 125 bpm RESULTS:Reactive COMMENTS: Occasional contractions and uterine irritability.   PLAN: 1. Continue fetal kick counts twice a day. 2. Continue antepartum testing as scheduled-weekly

## 2020-07-17 NOTE — Progress Notes (Signed)
OB-pt present for routine prenatal care and NST. Pt is currently prescribed Valtrex 500mg  to be token for the remainder of the pregnancy and pt is unaware of the medication.

## 2020-07-24 ENCOUNTER — Other Ambulatory Visit: Payer: Self-pay

## 2020-07-24 ENCOUNTER — Ambulatory Visit (INDEPENDENT_AMBULATORY_CARE_PROVIDER_SITE_OTHER): Payer: BC Managed Care – PPO | Admitting: Certified Nurse Midwife

## 2020-07-24 ENCOUNTER — Other Ambulatory Visit: Payer: BC Managed Care – PPO

## 2020-07-24 VITALS — BP 97/60 | HR 71 | Resp 16 | Wt 154.6 lb

## 2020-07-24 DIAGNOSIS — R300 Dysuria: Secondary | ICD-10-CM | POA: Diagnosis not present

## 2020-07-24 DIAGNOSIS — O26893 Other specified pregnancy related conditions, third trimester: Secondary | ICD-10-CM

## 2020-07-24 DIAGNOSIS — O24415 Gestational diabetes mellitus in pregnancy, controlled by oral hypoglycemic drugs: Secondary | ICD-10-CM

## 2020-07-24 LAB — POCT URINALYSIS DIPSTICK OB
Glucose, UA: NEGATIVE
Leukocytes, UA: NEGATIVE
Spec Grav, UA: 1.01 (ref 1.010–1.025)
Urobilinogen, UA: 0.2 E.U./dL
pH, UA: 7 (ref 5.0–8.0)

## 2020-07-24 NOTE — Progress Notes (Signed)
Patient was here for NST only. NST was good. [name] looked over her strip. No concerns. Patient also complained of having dysuria. Urine sample obtained and u/a was done. U/A showed a trace of blood. Sent out for urine culture.

## 2020-07-26 LAB — URINE CULTURE: Organism ID, Bacteria: NO GROWTH

## 2020-07-29 ENCOUNTER — Ambulatory Visit (INDEPENDENT_AMBULATORY_CARE_PROVIDER_SITE_OTHER): Payer: BC Managed Care – PPO | Admitting: Certified Nurse Midwife

## 2020-07-29 ENCOUNTER — Other Ambulatory Visit: Payer: BC Managed Care – PPO

## 2020-07-29 ENCOUNTER — Other Ambulatory Visit: Payer: Self-pay

## 2020-07-29 VITALS — BP 107/73 | HR 73 | Wt 154.0 lb

## 2020-07-29 DIAGNOSIS — O24415 Gestational diabetes mellitus in pregnancy, controlled by oral hypoglycemic drugs: Secondary | ICD-10-CM

## 2020-07-29 DIAGNOSIS — Z3A36 36 weeks gestation of pregnancy: Secondary | ICD-10-CM

## 2020-07-29 DIAGNOSIS — Z3483 Encounter for supervision of other normal pregnancy, third trimester: Secondary | ICD-10-CM

## 2020-07-29 DIAGNOSIS — O24419 Gestational diabetes mellitus in pregnancy, unspecified control: Secondary | ICD-10-CM

## 2020-07-29 LAB — POCT URINALYSIS DIPSTICK OB
Bilirubin, UA: NEGATIVE
Blood, UA: NEGATIVE
Glucose, UA: NEGATIVE
Ketones, UA: NEGATIVE
Leukocytes, UA: NEGATIVE
Nitrite, UA: NEGATIVE
POC,PROTEIN,UA: NEGATIVE
Spec Grav, UA: 1.01 (ref 1.010–1.025)
Urobilinogen, UA: 0.2 E.U./dL
pH, UA: 6.5 (ref 5.0–8.0)

## 2020-07-29 LAB — OB RESULTS CONSOLE GC/CHLAMYDIA: Gonorrhea: NEGATIVE

## 2020-07-29 NOTE — Progress Notes (Signed)
ROB and NST for GDM on medications. Pt states that she forgot her log. Her BS fasting ranging 70-80s with  2-3 out of range 101. @ hr PP are mostly in range less than 120 with a few that have been in 130 range. Pt encouraged to bring her log at next visit. She has a u/s appointment for growth on 8/4. GBS and culture collected today. Discussed induction at 39+ weeks due to GDM she is in agreement. Will schedule at next appointment.   NST: reactive Baseline 120 Decel-absent Moderate variably No decels  Occ ctx noted.    ROB & NST 1 wk   Doreene Burke, CNM

## 2020-07-29 NOTE — Patient Instructions (Signed)

## 2020-07-31 ENCOUNTER — Other Ambulatory Visit: Payer: Self-pay | Admitting: Certified Nurse Midwife

## 2020-07-31 LAB — STREP GP B NAA: Strep Gp B NAA: NEGATIVE

## 2020-08-01 LAB — GC/CHLAMYDIA PROBE AMP
Chlamydia trachomatis, NAA: NEGATIVE
Neisseria Gonorrhoeae by PCR: NEGATIVE

## 2020-08-07 ENCOUNTER — Ambulatory Visit (INDEPENDENT_AMBULATORY_CARE_PROVIDER_SITE_OTHER): Payer: BC Managed Care – PPO | Admitting: Obstetrics and Gynecology

## 2020-08-07 ENCOUNTER — Encounter: Payer: Self-pay | Admitting: Obstetrics and Gynecology

## 2020-08-07 ENCOUNTER — Other Ambulatory Visit: Payer: Self-pay

## 2020-08-07 ENCOUNTER — Other Ambulatory Visit: Payer: BC Managed Care – PPO

## 2020-08-07 VITALS — BP 126/82 | HR 81 | Wt 154.0 lb

## 2020-08-07 DIAGNOSIS — O24415 Gestational diabetes mellitus in pregnancy, controlled by oral hypoglycemic drugs: Secondary | ICD-10-CM

## 2020-08-07 DIAGNOSIS — Z3483 Encounter for supervision of other normal pregnancy, third trimester: Secondary | ICD-10-CM

## 2020-08-07 DIAGNOSIS — Z3A37 37 weeks gestation of pregnancy: Secondary | ICD-10-CM

## 2020-08-07 LAB — POCT URINALYSIS DIPSTICK OB
Bilirubin, UA: NEGATIVE
Blood, UA: NEGATIVE
Glucose, UA: NEGATIVE
Ketones, UA: NEGATIVE
Leukocytes, UA: NEGATIVE
Nitrite, UA: NEGATIVE
Spec Grav, UA: 1.01 (ref 1.010–1.025)
Urobilinogen, UA: 0.2 E.U./dL
pH, UA: 6.5 (ref 5.0–8.0)

## 2020-08-07 NOTE — Progress Notes (Signed)
ROB: She has no complaints.  She has occasional elevated sugars 1-2 times per day.  States that she is taking her glyburide as directed.  We began to discuss induction and she said that she was induced with her last baby and had some concerns regarding induction.  She was considering going past her due date if possible.  We will schedule her with [name] next visit and they can discuss dating and moderately controlled gestational diabetes.  NST today reactive.

## 2020-08-12 ENCOUNTER — Other Ambulatory Visit: Payer: Self-pay

## 2020-08-12 ENCOUNTER — Other Ambulatory Visit: Payer: Self-pay | Admitting: Obstetrics

## 2020-08-12 ENCOUNTER — Ambulatory Visit (INDEPENDENT_AMBULATORY_CARE_PROVIDER_SITE_OTHER): Payer: BC Managed Care – PPO | Admitting: Certified Nurse Midwife

## 2020-08-12 ENCOUNTER — Encounter: Payer: Self-pay | Admitting: Certified Nurse Midwife

## 2020-08-12 ENCOUNTER — Other Ambulatory Visit: Payer: BC Managed Care – PPO

## 2020-08-12 VITALS — BP 128/72 | HR 91 | Wt 153.9 lb

## 2020-08-12 DIAGNOSIS — O0993 Supervision of high risk pregnancy, unspecified, third trimester: Secondary | ICD-10-CM

## 2020-08-12 DIAGNOSIS — Z3A38 38 weeks gestation of pregnancy: Secondary | ICD-10-CM

## 2020-08-12 DIAGNOSIS — Z3483 Encounter for supervision of other normal pregnancy, third trimester: Secondary | ICD-10-CM

## 2020-08-12 DIAGNOSIS — O09523 Supervision of elderly multigravida, third trimester: Secondary | ICD-10-CM

## 2020-08-12 DIAGNOSIS — O24410 Gestational diabetes mellitus in pregnancy, diet controlled: Secondary | ICD-10-CM | POA: Diagnosis not present

## 2020-08-12 DIAGNOSIS — O24414 Gestational diabetes mellitus in pregnancy, insulin controlled: Secondary | ICD-10-CM

## 2020-08-12 LAB — POCT URINALYSIS DIPSTICK OB
Bilirubin, UA: NEGATIVE
Blood, UA: NEGATIVE
Glucose, UA: NEGATIVE
Ketones, UA: NEGATIVE
Leukocytes, UA: NEGATIVE
Nitrite, UA: NEGATIVE
POC,PROTEIN,UA: NEGATIVE
Spec Grav, UA: 1.015 (ref 1.010–1.025)
Urobilinogen, UA: 0.2 E.U./dL
pH, UA: 6 (ref 5.0–8.0)

## 2020-08-12 NOTE — Patient Instructions (Signed)
Labor Induction ?Labor induction is when steps are taken to cause a pregnant woman to begin the labor process. Most women go into labor on their own between 37 weeks and 42 weeks of pregnancy. When this does not happen, or when there is a medical need for labor to begin, steps may be taken to induce, or bring on, labor. ?Labor induction causes a pregnant woman's uterus to contract. It also causes the cervix to soften (ripen), open (dilate), and thin out. Usually, labor is not induced before 39 weeks of pregnancy unless there is a medical reason to do so. ?When is labor induction considered? ?Labor induction may be right for you if: ?Your pregnancy lasts longer than 41 to 42 weeks. ?Your placenta is separating from your uterus (placental abruption). ?You have a rupture of membranes and your labor does not begin. ?You have health problems, like diabetes or high blood pressure (preeclampsia) during your pregnancy. ?Your baby has stopped growing or does not have enough amniotic fluid. ?Before labor induction begins, your health care provider will consider the following factors: ?Your medical condition and the baby's condition. ?How many weeks you have been pregnant. ?How mature the baby's lungs are. ?The condition of your cervix. ?The position of the baby. ?The size of your birth canal. ?Tell a health care provider about: ?Any allergies you have. ?All medicines you are taking, including vitamins, herbs, eye drops, creams, and over-the-counter medicines. ?Any problems you or your family members have had with anesthetic medicines. ?Any surgeries you have had. ?Any blood disorders you have. ?Any medical conditions you have. ?What are the risks? ?Generally, this is a safe procedure. However, problems may occur, including: ?Failed induction. ?Changes in fetal heart rate, such as being too high, too low, or irregular (erratic). ?Infection in the mother or the baby. ?Increased risk of having a cesarean delivery. ?Breaking off  (abruption) of the placenta from the uterus. This is rare. ?Rupture of the uterus. This is very rare. ?Your baby could fail to get enough blood flow or oxygen. This can be life-threatening. ?When induction is needed for medical reasons, the benefits generally outweigh the risks. ?What happens during the procedure? ?During the procedure, your health care provider will use one of these methods to induce labor: ?Stripping the membranes. In this method, the amniotic sac tissue is gently separated from the cervix. This causes the following to happen: ?Your cervix stretches, which in turn causes the release of prostaglandins. ?Prostaglandins induce labor and cause the uterus to contract. ?This procedure is often done in an office visit. You will be sent home to wait for contractions to begin. ?Prostaglandin medicine. This medicine starts contractions and causes the cervix to dilate and ripen. This can be taken by mouth (orally) or by being inserted into the vagina (suppository). ?Inserting a small, thin tube (catheter) with a balloon into the vagina and then expanding the balloon with water to dilate the cervix. ?Breaking the water. In this method, a small instrument is used to make a small hole in the amniotic sac. This eventually causes the amniotic sac to break. Contractions should begin within a few hours. ?Medicine to trigger or strengthen contractions. This medicine is given through an IV that is inserted into a vein in your arm. ?This procedure may vary among health care providers and hospitals. ?Where to find more information ?March of Dimes: www.marchofdimes.org ?The American College of Obstetricians and Gynecologists: www.acog.org ?Summary ?Labor induction causes a pregnant woman's uterus to contract. It also causes the cervix   to soften (ripen), open (dilate), and thin out. ?Labor is usually not induced before 39 weeks of pregnancy unless there is a medical reason to do so. ?When induction is needed for medical  reasons, the benefits generally outweigh the risks. ?Talk with your health care provider about which methods of labor induction are right for you. ?This information is not intended to replace advice given to you by your health care provider. Make sure you discuss any questions you have with your health care provider. ?Document Revised: 10/12/2019 Document Reviewed: 10/12/2019 ?Elsevier Patient Education ? 2022 Elsevier Inc. ? ?

## 2020-08-12 NOTE — Progress Notes (Signed)
ROB and NST for GDM and AMA. GDM log reviewed. 1 elevated 2hr pp @149 . Discussed induction 39th week due to GDM and advanced age. She verbalizes and agrees. Inductions scheduled 8/9 @0800 . SVE per pt request 3/60/-2.   NST: reactive  Baseline:120 Moderate variability Accelerations: present  Decelerations: absent   Contractions: absent

## 2020-08-15 ENCOUNTER — Ambulatory Visit (HOSPITAL_BASED_OUTPATIENT_CLINIC_OR_DEPARTMENT_OTHER): Payer: BC Managed Care – PPO

## 2020-08-15 ENCOUNTER — Other Ambulatory Visit: Payer: Self-pay

## 2020-08-15 ENCOUNTER — Other Ambulatory Visit: Payer: Self-pay | Admitting: Maternal & Fetal Medicine

## 2020-08-15 ENCOUNTER — Other Ambulatory Visit
Admission: RE | Admit: 2020-08-15 | Discharge: 2020-08-15 | Disposition: A | Discharge status: Home / Self Care | Payer: BC Managed Care – PPO | Source: Ambulatory Visit | Attending: Certified Nurse Midwife | Admitting: Certified Nurse Midwife

## 2020-08-15 VITALS — BP 106/81 | HR 70 | Temp 97.9°F | Resp 20 | Ht 64.0 in | Wt 154.0 lb

## 2020-08-15 DIAGNOSIS — O0993 Supervision of high risk pregnancy, unspecified, third trimester: Secondary | ICD-10-CM | POA: Insufficient documentation

## 2020-08-15 DIAGNOSIS — O09523 Supervision of elderly multigravida, third trimester: Secondary | ICD-10-CM | POA: Insufficient documentation

## 2020-08-15 DIAGNOSIS — Z20822 Contact with and (suspected) exposure to covid-19: Secondary | ICD-10-CM | POA: Diagnosis not present

## 2020-08-15 DIAGNOSIS — O24414 Gestational diabetes mellitus in pregnancy, insulin controlled: Secondary | ICD-10-CM

## 2020-08-15 DIAGNOSIS — Z3A38 38 weeks gestation of pregnancy: Secondary | ICD-10-CM | POA: Insufficient documentation

## 2020-08-15 DIAGNOSIS — O35EXX0 Maternal care for other (suspected) fetal abnormality and damage, fetal genitourinary anomalies, not applicable or unspecified: Secondary | ICD-10-CM

## 2020-08-15 DIAGNOSIS — Z01812 Encounter for preprocedural laboratory examination: Secondary | ICD-10-CM | POA: Insufficient documentation

## 2020-08-15 DIAGNOSIS — O358XX0 Maternal care for other (suspected) fetal abnormality and damage, not applicable or unspecified: Secondary | ICD-10-CM

## 2020-08-15 LAB — SARS CORONAVIRUS 2 (TAT 6-24 HRS): SARS Coronavirus 2: NEGATIVE

## 2020-08-18 ENCOUNTER — Other Ambulatory Visit: Payer: Self-pay

## 2020-08-18 ENCOUNTER — Observation Stay
Admission: EM | Admit: 2020-08-18 | Discharge: 2020-08-18 | Disposition: A | Discharge status: Home / Self Care | Payer: BC Managed Care – PPO | Attending: Certified Nurse Midwife | Admitting: Certified Nurse Midwife

## 2020-08-18 ENCOUNTER — Encounter: Payer: Self-pay | Admitting: Obstetrics and Gynecology

## 2020-08-18 DIAGNOSIS — B029 Zoster without complications: Secondary | ICD-10-CM

## 2020-08-18 DIAGNOSIS — Z3A39 39 weeks gestation of pregnancy: Secondary | ICD-10-CM | POA: Insufficient documentation

## 2020-08-18 DIAGNOSIS — O358XX0 Maternal care for other (suspected) fetal abnormality and damage, not applicable or unspecified: Secondary | ICD-10-CM | POA: Diagnosis not present

## 2020-08-18 DIAGNOSIS — O35EXX0 Maternal care for other (suspected) fetal abnormality and damage, fetal genitourinary anomalies, not applicable or unspecified: Secondary | ICD-10-CM

## 2020-08-18 DIAGNOSIS — O368131 Decreased fetal movements, third trimester, fetus 1: Principal | ICD-10-CM | POA: Insufficient documentation

## 2020-08-18 DIAGNOSIS — O368190 Decreased fetal movements, unspecified trimester, not applicable or unspecified: Secondary | ICD-10-CM | POA: Diagnosis present

## 2020-08-18 LAB — RUPTURE OF MEMBRANE (ROM)PLUS: Rom Plus: NEGATIVE

## 2020-08-18 NOTE — OB Triage Note (Addendum)
Pt. reports to Labor & Delivery after not feeling her baby move since waking this morning. External [place] and [place] applied. Initial FHR 135bpm. Denies vaginal bleeding. Pain 0/10. States when she was using the bathroom last night, more liquid came out when she thought she was finished, therefore she is unsure if her water is intact. Vital signs WNL. Will continue to monitor.  ROM Plus performed after verbal order from [employer]. Test explained in depth to pt., and will await results.  ROM Plus negative. Pt. discharged home with labor precautions and advised to return for scheduled IOL on 08/09 at 0800. Pt. verbalized understanding and has no further questions or concerns at this time.

## 2020-08-18 NOTE — Discharge Instructions (Signed)
Return to Emergency Department [facility]) at 0800 on Tuesday, August 09th for scheduled Induction. Eat a yummy, healthy breakfast before you come!

## 2020-08-18 NOTE — OB Triage Note (Signed)
    L&D OB Triage Note  SUBJECTIVE Yesenia Davis is a 45 y.o. [id number] female at [redacted]w[redacted]d, EDD Estimated Date of Delivery: 08/23/20 who presented to triage with complaints of decreased fetal movement and discharge.She denies contraction and vaginal bleeding.   OB History  Gravida Para Term Preterm AB Living  5 3 3  0 1 3  SAB IAB Ectopic Multiple Live Births  1 0 0 0 3    # Outcome Date GA Lbr Len/2nd Weight Sex Delivery Anes PTL Lv  5 Current           4 Term 10/18/15 [redacted]w[redacted]d / 00:06 3120 g M Vag-Spont None  LIV     Name: Yesenia Davis     Apgar1: 8  Apgar5: 9  3 Term 2005   3225 g M Vag-Spont   LIV  2 SAB 2003          1 Term 1996   3266 g F Vag-Spont   LIV    No medications prior to admission.     OBJECTIVE  Nursing Evaluation:   BP 121/70 (BP Location: Left Arm)   Pulse 65   Temp 98 F (36.7 C) (Oral)   Resp 18   Ht 5\' 4"  (1.626 m)   Wt 69.9 kg   LMP 11/13/2019   BMI 26.43 kg/m    Findings:        Reactive NST      NST was performed and has been reviewed by me.  NST INTERPRETATION: Category I  Mode: ExternalBaseline : 120 Moderate variability Accelerations present Decelerations absent   Ctx: occasionalContraction Frequency (min): Rare  ASSESSMENT Impression:  1.  Pregnancy:  at [redacted]w[redacted]d , EDD Estimated Date of Delivery: 08/23/20 2.  Reassuring fetal and maternal status 3.  ROM plus negative  PLAN 1. Current condition and above findings reviewed.  Reassuring fetal and maternal condition. 2. Discharge home with standard labor precautions given to return to L&D or call the office for problems. 3. Continue routine prenatal care.      [redacted]w[redacted]d, CNM

## 2020-08-19 ENCOUNTER — Other Ambulatory Visit: Payer: Self-pay | Admitting: Certified Nurse Midwife

## 2020-08-20 ENCOUNTER — Other Ambulatory Visit: Payer: Self-pay

## 2020-08-20 ENCOUNTER — Inpatient Hospital Stay
Admission: EM | Admit: 2020-08-20 | Discharge: 2020-08-22 | DRG: 798 | Disposition: A | Discharge status: Home / Self Care | Payer: BC Managed Care – PPO | Attending: Certified Nurse Midwife | Admitting: Certified Nurse Midwife

## 2020-08-20 ENCOUNTER — Encounter: Payer: Self-pay | Admitting: Certified Nurse Midwife

## 2020-08-20 DIAGNOSIS — Z3A39 39 weeks gestation of pregnancy: Secondary | ICD-10-CM | POA: Diagnosis not present

## 2020-08-20 DIAGNOSIS — O24425 Gestational diabetes mellitus in childbirth, controlled by oral hypoglycemic drugs: Secondary | ICD-10-CM | POA: Diagnosis present

## 2020-08-20 DIAGNOSIS — O368130 Decreased fetal movements, third trimester, not applicable or unspecified: Secondary | ICD-10-CM | POA: Diagnosis present

## 2020-08-20 DIAGNOSIS — Z302 Encounter for sterilization: Secondary | ICD-10-CM | POA: Diagnosis not present

## 2020-08-20 DIAGNOSIS — O24415 Gestational diabetes mellitus in pregnancy, controlled by oral hypoglycemic drugs: Secondary | ICD-10-CM

## 2020-08-20 DIAGNOSIS — O35EXX0 Maternal care for other (suspected) fetal abnormality and damage, fetal genitourinary anomalies, not applicable or unspecified: Secondary | ICD-10-CM

## 2020-08-20 DIAGNOSIS — O358XX0 Maternal care for other (suspected) fetal abnormality and damage, not applicable or unspecified: Principal | ICD-10-CM

## 2020-08-20 LAB — RPR: RPR Ser Ql: NONREACTIVE

## 2020-08-20 LAB — CBC
HCT: 37.9 % (ref 36.0–46.0)
Hemoglobin: 12.5 g/dL (ref 12.0–15.0)
MCH: 29.1 pg (ref 26.0–34.0)
MCHC: 33 g/dL (ref 30.0–36.0)
MCV: 88.3 fL (ref 80.0–100.0)
Platelets: 215 10*3/uL (ref 150–400)
RBC: 4.29 MIL/uL (ref 3.87–5.11)
RDW: 14.8 % (ref 11.5–15.5)
WBC: 8.3 10*3/uL (ref 4.0–10.5)
nRBC: 0 % (ref 0.0–0.2)

## 2020-08-20 LAB — GLUCOSE, CAPILLARY: Glucose-Capillary: 60 mg/dL — ABNORMAL LOW (ref 70–99)

## 2020-08-20 LAB — TYPE AND SCREEN
ABO/RH(D): A POS
Antibody Screen: NEGATIVE

## 2020-08-20 LAB — GLUCOSE, RANDOM: Glucose, Bld: 134 mg/dL — ABNORMAL HIGH (ref 70–99)

## 2020-08-20 MED ORDER — OXYCODONE-ACETAMINOPHEN 5-325 MG PO TABS: 1.0000 | ORAL_TABLET | ORAL | Status: DC | PRN

## 2020-08-20 MED ORDER — AMMONIA AROMATIC IN INHA
RESPIRATORY_TRACT | Status: AC
  Filled 2020-08-20: qty 10

## 2020-08-20 MED ORDER — ONDANSETRON HCL 4 MG/2ML IJ SOLN
4.0000 mg | INTRAMUSCULAR | Status: DC | PRN
  Administered 2020-08-21: 4 mg via INTRAVENOUS
  Filled 2020-08-20: qty 2

## 2020-08-20 MED ORDER — LACTATED RINGERS IV SOLN: INTRAVENOUS | Status: DC

## 2020-08-20 MED ORDER — MISOPROSTOL 200 MCG PO TABS
ORAL_TABLET | ORAL | Status: AC
  Filled 2020-08-20: qty 4

## 2020-08-20 MED ORDER — BUTORPHANOL TARTRATE 1 MG/ML IJ SOLN
1.0000 mg | INTRAMUSCULAR | Status: DC | PRN
  Administered 2020-08-20 (×2): 1 mg via INTRAVENOUS
  Filled 2020-08-20 (×2): qty 1

## 2020-08-20 MED ORDER — OXYTOCIN BOLUS FROM INFUSION
333.0000 mL | Freq: Once | INTRAVENOUS | Status: AC
Start: 2020-08-20 — End: 2020-08-20
  Administered 2020-08-20: 333 mL via INTRAVENOUS

## 2020-08-20 MED ORDER — METHYLERGONOVINE MALEATE 0.2 MG PO TABS
0.2000 mg | ORAL_TABLET | ORAL | Status: DC | PRN
  Filled 2020-08-20: qty 1

## 2020-08-20 MED ORDER — WITCH HAZEL-GLYCERIN EX PADS
1.0000 "application " | MEDICATED_PAD | CUTANEOUS | Status: DC | PRN
  Administered 2020-08-20: 1 via TOPICAL
  Filled 2020-08-20: qty 100

## 2020-08-20 MED ORDER — FERROUS SULFATE 325 (65 FE) MG PO TABS
325.0000 mg | ORAL_TABLET | Freq: Every day | ORAL | Status: DC
  Administered 2020-08-22: 325 mg via ORAL
  Filled 2020-08-20: qty 1

## 2020-08-20 MED ORDER — SENNOSIDES-DOCUSATE SODIUM 8.6-50 MG PO TABS
2.0000 | ORAL_TABLET | ORAL | Status: DC
  Administered 2020-08-20 – 2020-08-21 (×2): 2 via ORAL
  Filled 2020-08-20 (×2): qty 2

## 2020-08-20 MED ORDER — SOD CITRATE-CITRIC ACID 500-334 MG/5ML PO SOLN: 30.0000 mL | ORAL | Status: DC | PRN

## 2020-08-20 MED ORDER — ONDANSETRON HCL 4 MG/2ML IJ SOLN
4.0000 mg | Freq: Four times a day (QID) | INTRAMUSCULAR | Status: DC | PRN
  Administered 2020-08-20: 4 mg via INTRAVENOUS
  Filled 2020-08-20: qty 2

## 2020-08-20 MED ORDER — ONDANSETRON HCL 4 MG PO TABS
4.0000 mg | ORAL_TABLET | ORAL | Status: DC | PRN
  Filled 2020-08-20: qty 1

## 2020-08-20 MED ORDER — PRENATAL MULTIVITAMIN CH: 1.0000 | ORAL_TABLET | Freq: Every day | ORAL | Status: DC

## 2020-08-20 MED ORDER — LIDOCAINE HCL (PF) 1 % IJ SOLN
INTRAMUSCULAR | Status: AC
  Filled 2020-08-20: qty 30

## 2020-08-20 MED ORDER — COCONUT OIL OIL
1.0000 "application " | TOPICAL_OIL | Status: DC | PRN
  Filled 2020-08-20: qty 120

## 2020-08-20 MED ORDER — TETANUS-DIPHTH-ACELL PERTUSSIS 5-2.5-18.5 LF-MCG/0.5 IM SUSY
0.5000 mL | PREFILLED_SYRINGE | Freq: Once | INTRAMUSCULAR | Status: DC
  Filled 2020-08-20: qty 0.5

## 2020-08-20 MED ORDER — LIDOCAINE HCL (PF) 1 % IJ SOLN: 30.0000 mL | INTRAMUSCULAR | Status: DC | PRN

## 2020-08-20 MED ORDER — DIBUCAINE (PERIANAL) 1 % EX OINT
1.0000 "application " | TOPICAL_OINTMENT | CUTANEOUS | Status: DC | PRN
  Administered 2020-08-20: 1 via RECTAL
  Filled 2020-08-20: qty 28

## 2020-08-20 MED ORDER — MISOPROSTOL 50MCG HALF TABLET: 50.0000 ug | ORAL_TABLET | ORAL | Status: DC

## 2020-08-20 MED ORDER — IBUPROFEN 600 MG PO TABS
600.0000 mg | ORAL_TABLET | Freq: Four times a day (QID) | ORAL | Status: DC
  Administered 2020-08-20 – 2020-08-22 (×6): 600 mg via ORAL
  Filled 2020-08-20 (×6): qty 1

## 2020-08-20 MED ORDER — ACETAMINOPHEN 325 MG PO TABS
650.0000 mg | ORAL_TABLET | ORAL | Status: DC | PRN
  Administered 2020-08-20 – 2020-08-21 (×2): 650 mg via ORAL
  Filled 2020-08-20 (×2): qty 2

## 2020-08-20 MED ORDER — OXYTOCIN-SODIUM CHLORIDE 30-0.9 UT/500ML-% IV SOLN: 2.5000 [IU]/h | INTRAVENOUS | Status: DC

## 2020-08-20 MED ORDER — OXYTOCIN 10 UNIT/ML IJ SOLN
INTRAMUSCULAR | Status: AC
  Filled 2020-08-20: qty 2

## 2020-08-20 MED ORDER — LACTATED RINGERS IV SOLN: 500.0000 mL | INTRAVENOUS | Status: DC | PRN

## 2020-08-20 MED ORDER — TERBUTALINE SULFATE 1 MG/ML IJ SOLN: 0.2500 mg | Freq: Once | INTRAMUSCULAR | Status: DC | PRN

## 2020-08-20 MED ORDER — DOCUSATE SODIUM 100 MG PO CAPS: 100.0000 mg | ORAL_CAPSULE | Freq: Two times a day (BID) | ORAL | Status: DC

## 2020-08-20 MED ORDER — METHYLERGONOVINE MALEATE 0.2 MG/ML IJ SOLN: 0.2000 mg | INTRAMUSCULAR | Status: DC | PRN

## 2020-08-20 MED ORDER — OXYCODONE-ACETAMINOPHEN 5-325 MG PO TABS
2.0000 | ORAL_TABLET | ORAL | Status: DC | PRN
Start: 2020-08-20 — End: 2020-08-22
  Administered 2020-08-21: 2 via ORAL
  Filled 2020-08-20: qty 2

## 2020-08-20 MED ORDER — BENZOCAINE-MENTHOL 20-0.5 % EX AERO
1.0000 "application " | INHALATION_SPRAY | CUTANEOUS | Status: DC | PRN
  Administered 2020-08-20: 1 via TOPICAL
  Filled 2020-08-20: qty 56

## 2020-08-20 MED ORDER — SIMETHICONE 80 MG PO CHEW
80.0000 mg | CHEWABLE_TABLET | ORAL | Status: DC | PRN
  Administered 2020-08-21: 80 mg via ORAL
  Filled 2020-08-20: qty 1

## 2020-08-20 MED ORDER — OXYTOCIN-SODIUM CHLORIDE 30-0.9 UT/500ML-% IV SOLN
1.0000 m[IU]/min | INTRAVENOUS | Status: DC
  Administered 2020-08-20: 4 m[IU]/min via INTRAVENOUS
  Filled 2020-08-20: qty 500

## 2020-08-20 NOTE — H&P (Signed)
History and Physical   HPI  Yesenia Davis is a 45 y.o. [id number] at 63w4dEstimated Date of Delivery: 08/23/20 who is being admitted for induction of labor due to GDM with medication management.    OB History  OB History  Gravida Para Term Preterm AB Living  _0 0 1 3  SAB IAB Ectopic Multiple Live Births  1 0 0 0 3    # Outcome Date GA Lbr Len/2nd Weight Sex Delivery Anes PTL Lv  5 Current           4 Term 10/18/15 [redacted]w[redacted]d 00:06 3120 g M Vag-Spont None  LIV     Name: Basista,BOY [name]     Apgar1: 8  Apgar5: 9  3 Term 2005   3225 g M Vag-Spont   LIV  2 SAB 2003          1 Term 1996   3266 g F Vag-Spont   LIV    PROBLEM LIST  Pregnancy complications or risks: Patient Active Problem List   Diagnosis Date Noted   Labor and delivery, indication for care 08/20/2020   Decreased fetal movement 08/18/2020   Shingles outbreak 04/25/2020   Pyelectasis of fetus on prenatal ultrasound 03/29/2020   Positive RPR test 02/15/2020   Postpartum depression 11/29/2015   Gestational diabetes mellitus 10/18/2015    Prenatal labs and studies: ABO, Rh: A/Positive/-- (01/27 1014) Antibody: Negative (01/27 1014) Rubella: 7.04 (01/27 1014) RPR: Non Reactive (05/24 0913)  HBsAg: Negative (01/27 1014)  HIV: Non Reactive (01/27 1014)  GBJGO:TLXBWIOM/ (07/18 1357)   Past Medical History:  Diagnosis Date   Acne vulgaris    Epigastric pain    Gastritis    GERD (gastroesophageal reflux disease)    Gestational diabetes    Migraines      Past Surgical History:  Procedure Laterality Date   CHOLECYSTECTOMY  1996   cyst removed     from neck- b9   DILATION AND CURETTAGE OF UTERUS       Medications    Current Discharge Medication List     CONTINUE these medications which have NOT CHANGED   Details  Accu-Chek Softclix Lancets lancets Use as instructed Qty: 100 each, Refills: 12    aspirin EC 81 MG tablet Take 1 tablet (81 mg total) by mouth daily. Take after 12 weeks  for prevention of preeclampssia later in pregnancy Qty: 300 tablet, Refills: 2    !! blood glucose meter kit and supplies KIT Can be generic Qty: 1 each, Refills: 0    !! Blood Glucose Monitoring Suppl (ACCU-CHEK GUIDE) w/Device KIT Use as directed Qty: 1 kit, Refills: 0    !! glyBURIDE (DIABETA) 2.5 MG tablet Take 1 tablet (2.5 mg total) by mouth at bedtime. Qty: 30 tablet, Refills: 2    !! glyBURIDE (DIABETA) 5 MG tablet TOME UNA TABLETA TODOS LOS DIAS CON EL DESAYUNO Qty: 30 tablet, Refills: 1    magnesium oxide (MAG-OX) 400 (240 Mg) MG tablet TAKE 1 CAPSULE (400 MG TOTAL) BY MOUTH IN THE MORNING AND 1 TABLET AT BEDTIME. Qty: 60 tablet, Refills: 1    Prenatal 27-1 MG TABS TAKE 1 TABLET DAILY WHILE TRYING TO CONCEIVE, PREGNANT AND BREASTFEEDING.    [employer] PURELAX 17 GM/SCOOP powder TAKE 255 G BY MOUTH ONCE FOR 1 DOSE. TO USE EVERY THIRD DAY IN NO BM OCCURS SPONTANEOUSLY Qty: 255 g, Refills: 0   Comments: 17 G everyday until BM then use every 3  days    docusate sodium (COLACE) 100 MG capsule Take 1 capsule (100 mg total) by mouth 2 (two) times daily as needed. Qty: 30 capsule, Refills: 2    glucose blood (ACCU-CHEK SMARTVIEW) test strip Use as instructed to check blood sugars Qty: 100 each, Refills: 12    VITAMIN D-1000 MAX ST 25 MCG (1000 UT) tablet Take 1,000 Units by mouth daily.     !! - Potential duplicate medications found. Please discuss with provider.       Allergies  Ciprofloxacin hcl  Review of Systems  Constitutional: negative Eyes: negative Ears, nose, mouth, throat, and face: negative Respiratory: negative Cardiovascular: negative Gastrointestinal: negative Genitourinary:negative Integument/breast: negative Hematologic/lymphatic: negative Musculoskeletal:negative Neurological: negative Behavioral/Psych: negative Endocrine: negative Allergic/Immunologic: negative  Physical Exam  LMP 11/13/2019   Lungs:  CTA B Cardio: RRR without M/R/G Abd:  Soft, gravid, NT Presentation: cephalic EXT: No C/C/ 1+ Edema DTRs: 2+ B CERVIX: Dilation: 3 Effacement (%): 70 Cervical Position: Posterior Station: -2 Presentation: Vertex Exam by:: A Rees Matura CNM  See Prenatal records for more detailed PE.     FHR:  Baseline: 120 bpm, Variability: Good {> 6 bpm), Accelerations: Reactive, and Decelerations: Absent  Toco: Uterine Contractions: irregular   Test Results  No results found for this or any previous visit (from the past 24 hour(s)). Group B Strep negative  Assessment   G5P3013 at 65w4dEstimated Date of Delivery: 08/23/20  The fetus is reassuring.   Patient Active Problem List   Diagnosis Date Noted   Labor and delivery, indication for care 08/20/2020   Decreased fetal movement 08/18/2020   Shingles outbreak 04/25/2020   Pyelectasis of fetus on prenatal ultrasound 03/29/2020   Positive RPR test 02/15/2020   Postpartum depression 11/29/2015   Gestational diabetes mellitus 10/18/2015    Plan  1. Admit to L&D :   2. EFM:-- Category 1 3. Stadol or Epidural if desired.   4. Admission labs  5. Pitocin induction as ordered 6. Anticipate NSVD 7. Dr. EAmalia Haileyaware of admission   APhilip Aspen CNorth Dakota 08/20/2020 8:15 AM

## 2020-08-20 NOTE — Lactation Note (Signed)
This note was copied from a baby's chart. Lactation Consultation Note  Patient Name: Yesenia Davis [id number] Date: 08/20/2020 Reason for consult: Follow-up assessment Age:45 years  Initial lactation visits conducted in [facility]. Mom is P4 SVD 1 hour ago. Reports known history of low milk supply/formula supplementation. Desires to only BF at this time, but states open to formula if needed.  First attempt made <1hr from delivery, baby spitty, mouth full of mucous, angry, and pushing away from the breast. After 10 minutes brought back skin to skin where he calmed. Second attempt made at 1hr of life after weights/eyes/thighs were completed. Brought into football hold on L breast. Baby grasped the breast easily with flanged lips. Rhythmic sucking pattern on/off as he adjusted to suck/swallow/breathe. Mom given guidance on how to keep the baby awake, breast massage and compression, and when to tell he was done.  LC provided guidance on normal newborn feeding patterns and behaviors, early feeding cues, signs that baby is getting enough, stomach size, and milk supply and demand. Encouraged 8-12 attempts in first 24 hours with hand expression/spoon feeding as needed.  Mom plans BTL tomorrow; formula to be given by dad if needed.  Maternal Data Has patient been taught Hand Expression?: Yes Does the patient have breastfeeding experience prior to this delivery?: Yes How long did the patient breastfeed?: 3 months with last  Feeding Mother's Current Feeding Choice: Breast Milk and Formula  LATCH Score Latch: Grasps breast easily, tongue down, lips flanged, rhythmical sucking.  Audible Swallowing: A few with stimulation  Type of Nipple: Everted at rest and after stimulation  Comfort (Breast/Nipple): Soft / non-tender  Hold (Positioning): Assistance needed to correctly position infant at breast and maintain latch.  LATCH Score: 8   Lactation Tools Discussed/Used     Interventions Interventions: Breast feeding basics reviewed;Assisted with latch;Hand express;Adjust position;Support pillows;Position options;Education  Discharge    Consult Status Consult Status: Follow-up Date: 08/21/20 Follow-up type: In-patient    Danford Bad 08/20/2020, 4:06 PM

## 2020-08-20 NOTE — Progress Notes (Signed)
LABOR NOTE   Yesenia Davis 45 y.o.GP@ at [redacted]w[redacted]d  SUBJECTIVE:  Doing well, getting uncomfortable.  Analgesia: Labor support without medications  OBJECTIVE:  BP 119/76 (BP Location: Right Arm)   Pulse 85   Temp 98.4 F (36.9 C) (Oral)   Resp 16   Ht 5\' 4"  (1.626 m)   Wt 69.9 kg   LMP 11/13/2019   BMI 26.43 kg/m  No intake/output data recorded.  She has shown cervical change. CERVIX: 5cm:  80%:   -2:   mid position:   soft SVE:   Dilation: 5 Effacement (%): 80 Station: -2 Exam by:: A Wessie Shanks CNM CONTRACTIONS: regular, every 2-3 minutes FHR: Fetal heart tracing reviewed. Baseline: 120 bpm, Variability: Good {> 6 bpm), Accelerations: Reactive, and Decelerations: Absent Category I    Labs: Lab Results  Component Value Date   WBC 8.3 08/20/2020   HGB 12.5 08/20/2020   HCT 37.9 08/20/2020   MCV 88.3 08/20/2020   PLT 215 08/20/2020    ASSESSMENT: 1) Labor curve reviewed.       Progress: Early latent labor.     Membranes: attempted to rupture fluid. No fluid seen. Will continue to monitor. Bloody show present      Active Problems:   Labor and delivery, indication for care   PLAN: expectant management, continue present management, and IV Pitocin augmentation   10/20/2020, CNM  08/20/2020 12:34 PM

## 2020-08-21 ENCOUNTER — Inpatient Hospital Stay: Payer: BC Managed Care – PPO | Admitting: Anesthesiology

## 2020-08-21 ENCOUNTER — Encounter: Payer: Self-pay | Admitting: Certified Nurse Midwife

## 2020-08-21 ENCOUNTER — Encounter
Admission: EM | Disposition: A | Discharge status: Home / Self Care | Payer: Self-pay | Source: Home / Self Care | Attending: Certified Nurse Midwife

## 2020-08-21 DIAGNOSIS — Z302 Encounter for sterilization: Secondary | ICD-10-CM

## 2020-08-21 HISTORY — PX: TUBAL LIGATION: SHX77

## 2020-08-21 LAB — CBC
HCT: 33.3 % — ABNORMAL LOW (ref 36.0–46.0)
Hemoglobin: 11.1 g/dL — ABNORMAL LOW (ref 12.0–15.0)
MCH: 29.7 pg (ref 26.0–34.0)
MCHC: 33.3 g/dL (ref 30.0–36.0)
MCV: 89 fL (ref 80.0–100.0)
Platelets: 169 10*3/uL (ref 150–400)
RBC: 3.74 MIL/uL — ABNORMAL LOW (ref 3.87–5.11)
RDW: 14.9 % (ref 11.5–15.5)
WBC: 13 10*3/uL — ABNORMAL HIGH (ref 4.0–10.5)
nRBC: 0 % (ref 0.0–0.2)

## 2020-08-21 LAB — GLUCOSE, RANDOM: Glucose, Bld: 115 mg/dL — ABNORMAL HIGH (ref 70–99)

## 2020-08-21 SURGERY — LIGATION, FALLOPIAN TUBE, POSTPARTUM
Anesthesia: General | Site: Abdomen | Laterality: Bilateral

## 2020-08-21 MED ORDER — OXYTOCIN-SODIUM CHLORIDE 30-0.9 UT/500ML-% IV SOLN: 2.5000 [IU]/h | INTRAVENOUS | Status: DC | PRN

## 2020-08-21 MED ORDER — IBUPROFEN 600 MG PO TABS: 600.0000 mg | ORAL_TABLET | Freq: Four times a day (QID) | ORAL | Status: DC

## 2020-08-21 MED ORDER — PROPOFOL 10 MG/ML IV BOLUS
INTRAVENOUS | Status: AC
  Filled 2020-08-21: qty 20

## 2020-08-21 MED ORDER — BENZOCAINE-MENTHOL 20-0.5 % EX AERO: 1.0000 "application " | INHALATION_SPRAY | CUTANEOUS | Status: DC | PRN

## 2020-08-21 MED ORDER — FENTANYL CITRATE (PF) 100 MCG/2ML IJ SOLN
INTRAMUSCULAR | Status: DC | PRN
  Administered 2020-08-21: 50 ug via INTRAVENOUS

## 2020-08-21 MED ORDER — ONDANSETRON HCL 4 MG/2ML IJ SOLN
INTRAMUSCULAR | Status: AC
  Filled 2020-08-21: qty 2

## 2020-08-21 MED ORDER — SEVOFLURANE IN SOLN
RESPIRATORY_TRACT | Status: AC
  Filled 2020-08-21: qty 250

## 2020-08-21 MED ORDER — BUPIVACAINE HCL 0.5 % IJ SOLN
INTRAMUSCULAR | Status: DC | PRN
  Administered 2020-08-21: 5 mL

## 2020-08-21 MED ORDER — DOCUSATE SODIUM 100 MG PO CAPS
100.0000 mg | ORAL_CAPSULE | Freq: Two times a day (BID) | ORAL | Status: DC
  Administered 2020-08-21 – 2020-08-22 (×2): 100 mg via ORAL
  Filled 2020-08-21 (×2): qty 1

## 2020-08-21 MED ORDER — FENTANYL CITRATE (PF) 100 MCG/2ML IJ SOLN
25.0000 ug | INTRAMUSCULAR | Status: DC | PRN
  Administered 2020-08-21: 50 ug via INTRAVENOUS
  Administered 2020-08-21: 25 ug via INTRAVENOUS

## 2020-08-21 MED ORDER — KETOROLAC TROMETHAMINE 30 MG/ML IJ SOLN
INTRAMUSCULAR | Status: DC | PRN
  Administered 2020-08-21: 30 mg via INTRAVENOUS

## 2020-08-21 MED ORDER — OXYCODONE-ACETAMINOPHEN 5-325 MG PO TABS: 1.0000 | ORAL_TABLET | ORAL | Status: DC | PRN

## 2020-08-21 MED ORDER — PHENYLEPHRINE HCL (PRESSORS) 10 MG/ML IV SOLN
INTRAVENOUS | Status: DC | PRN
  Administered 2020-08-21: 100 ug via INTRAVENOUS
  Administered 2020-08-21: 200 ug via INTRAVENOUS

## 2020-08-21 MED ORDER — OXYCODONE HCL 5 MG/5ML PO SOLN
5.0000 mg | Freq: Once | ORAL | Status: AC | PRN
  Administered 2020-08-21: 5 mg via ORAL

## 2020-08-21 MED ORDER — BUPIVACAINE HCL (PF) 0.5 % IJ SOLN
INTRAMUSCULAR | Status: AC
  Filled 2020-08-21: qty 30

## 2020-08-21 MED ORDER — SUGAMMADEX SODIUM 200 MG/2ML IV SOLN
INTRAVENOUS | Status: DC | PRN
  Administered 2020-08-21: 400 mg via INTRAVENOUS

## 2020-08-21 MED ORDER — ACETAMINOPHEN 325 MG PO TABS: 650.0000 mg | ORAL_TABLET | ORAL | Status: DC | PRN

## 2020-08-21 MED ORDER — PRENATAL MULTIVITAMIN CH: 1.0000 | ORAL_TABLET | Freq: Every day | ORAL | Status: DC

## 2020-08-21 MED ORDER — DEXAMETHASONE SODIUM PHOSPHATE 10 MG/ML IJ SOLN
INTRAMUSCULAR | Status: AC
  Filled 2020-08-21: qty 1

## 2020-08-21 MED ORDER — KETOROLAC TROMETHAMINE 30 MG/ML IJ SOLN
INTRAMUSCULAR | Status: AC
  Filled 2020-08-21: qty 1

## 2020-08-21 MED ORDER — ROCURONIUM BROMIDE 100 MG/10ML IV SOLN
INTRAVENOUS | Status: DC | PRN
  Administered 2020-08-21: 40 mg via INTRAVENOUS

## 2020-08-21 MED ORDER — MIDAZOLAM HCL 2 MG/2ML IJ SOLN
INTRAMUSCULAR | Status: AC
  Filled 2020-08-21: qty 2

## 2020-08-21 MED ORDER — ONDANSETRON HCL 4 MG/2ML IJ SOLN
4.0000 mg | Freq: Once | INTRAMUSCULAR | Status: DC | PRN
Start: 2020-08-21 — End: 2020-08-21

## 2020-08-21 MED ORDER — MIDAZOLAM HCL 2 MG/2ML IJ SOLN
INTRAMUSCULAR | Status: DC | PRN
  Administered 2020-08-21: 1 mg via INTRAVENOUS

## 2020-08-21 MED ORDER — TETANUS-DIPHTH-ACELL PERTUSSIS 5-2.5-18.5 LF-MCG/0.5 IM SUSY: 0.5000 mL | PREFILLED_SYRINGE | Freq: Once | INTRAMUSCULAR | Status: DC

## 2020-08-21 MED ORDER — FENTANYL CITRATE (PF) 100 MCG/2ML IJ SOLN
INTRAMUSCULAR | Status: AC
  Administered 2020-08-21: 25 ug via INTRAVENOUS
  Filled 2020-08-21: qty 2

## 2020-08-21 MED ORDER — OXYCODONE HCL 5 MG PO TABS
ORAL_TABLET | ORAL | Status: AC
  Filled 2020-08-21: qty 1

## 2020-08-21 MED ORDER — LIDOCAINE HCL (CARDIAC) PF 100 MG/5ML IV SOSY
PREFILLED_SYRINGE | INTRAVENOUS | Status: DC | PRN
  Administered 2020-08-21: 80 mg via INTRAVENOUS

## 2020-08-21 MED ORDER — PROPOFOL 10 MG/ML IV BOLUS
INTRAVENOUS | Status: DC | PRN
  Administered 2020-08-21: 150 mg via INTRAVENOUS

## 2020-08-21 MED ORDER — ONDANSETRON HCL 4 MG/2ML IJ SOLN
INTRAMUSCULAR | Status: DC | PRN
  Administered 2020-08-21: 4 mg via INTRAVENOUS

## 2020-08-21 MED ORDER — SODIUM CHLORIDE 0.9 % IV SOLN: INTRAVENOUS | Status: DC | PRN

## 2020-08-21 MED ORDER — SIMETHICONE 80 MG PO CHEW
80.0000 mg | CHEWABLE_TABLET | ORAL | Status: DC | PRN
  Administered 2020-08-21: 80 mg via ORAL
  Filled 2020-08-21: qty 1

## 2020-08-21 MED ORDER — OXYCODONE HCL 5 MG PO TABS
5.0000 mg | ORAL_TABLET | Freq: Once | ORAL | Status: AC | PRN
Start: 2020-08-21 — End: 2020-08-21

## 2020-08-21 MED ORDER — DEXAMETHASONE SODIUM PHOSPHATE 10 MG/ML IJ SOLN
INTRAMUSCULAR | Status: DC | PRN
  Administered 2020-08-21: 10 mg via INTRAVENOUS

## 2020-08-21 MED ORDER — ZOLPIDEM TARTRATE 5 MG PO TABS: 5.0000 mg | ORAL_TABLET | Freq: Every evening | ORAL | Status: DC | PRN

## 2020-08-21 MED ORDER — ROCURONIUM BROMIDE 10 MG/ML (PF) SYRINGE
PREFILLED_SYRINGE | INTRAVENOUS | Status: AC
  Filled 2020-08-21: qty 10

## 2020-08-21 MED ORDER — LIDOCAINE HCL (PF) 2 % IJ SOLN
INTRAMUSCULAR | Status: AC
  Filled 2020-08-21: qty 5

## 2020-08-21 MED ORDER — DIPHENHYDRAMINE HCL 25 MG PO CAPS: 25.0000 mg | ORAL_CAPSULE | Freq: Four times a day (QID) | ORAL | Status: DC | PRN

## 2020-08-21 MED ORDER — 0.9 % SODIUM CHLORIDE (POUR BTL) OPTIME
TOPICAL | Status: DC | PRN
  Administered 2020-08-21: 10 mL

## 2020-08-21 MED ORDER — FENTANYL CITRATE (PF) 100 MCG/2ML IJ SOLN
INTRAMUSCULAR | Status: AC
  Filled 2020-08-21: qty 2

## 2020-08-21 SURGICAL SUPPLY — 31 items
BACTOSHIELD CHG 4% 4OZ (MISCELLANEOUS)
BENZOIN TINCTURE PRP APPL 2/3 (GAUZE/BANDAGES/DRESSINGS) ×2 IMPLANT
BLADE CLIPPER SURG (BLADE) ×2 IMPLANT
BLADE SURG 15 STRL LF DISP TIS (BLADE) ×1 IMPLANT
BLADE SURG 15 STRL SS (BLADE) ×1
BNDG ADH 2 X3.75 FABRIC TAN LF (GAUZE/BANDAGES/DRESSINGS) ×2 IMPLANT
CHLORAPREP W/TINT 26 (MISCELLANEOUS) ×4 IMPLANT
CLIP FILSHIE TUBAL LIGA STRL (Clip) ×2 IMPLANT
DRAPE LAPAROTOMY 77X122 PED (DRAPES) ×2 IMPLANT
DRSG TEGADERM 2-3/8X2-3/4 SM (GAUZE/BANDAGES/DRESSINGS) ×2 IMPLANT
DRSG TELFA 4X3 1S NADH ST (GAUZE/BANDAGES/DRESSINGS) ×2 IMPLANT
GAUZE 4X4 16PLY ~~LOC~~+RFID DBL (SPONGE) ×2 IMPLANT
GLOVE SURG ENC MOIS LTX SZ8 (GLOVE) ×2 IMPLANT
GLOVE SURG POLY ORTHO LF SZ7.5 (GLOVE) ×2 IMPLANT
GOWN STRL REUS W/ TWL LRG LVL3 (GOWN DISPOSABLE) ×2 IMPLANT
GOWN STRL REUS W/TWL LRG LVL3 (GOWN DISPOSABLE) ×2
KIT TURNOVER CYSTO (KITS) ×2 IMPLANT
LABEL OR SOLS (LABEL) ×2 IMPLANT
MANIFOLD NEPTUNE II (INSTRUMENTS) ×2 IMPLANT
NEEDLE HYPO 25GX1X1/2 BEV (NEEDLE) ×2 IMPLANT
NS IRRIG 500ML POUR BTL (IV SOLUTION) ×2 IMPLANT
PACK BASIN MINOR ARMC (MISCELLANEOUS) ×2 IMPLANT
RETRACTOR RING XSMALL (MISCELLANEOUS) ×1 IMPLANT
RTRCTR WOUND ALEXIS 13CM XS SH (MISCELLANEOUS) ×2
SCRUB CHG 4% DYNA-HEX 4OZ (MISCELLANEOUS) IMPLANT
STRIP CLOSURE SKIN 1/2X4 (GAUZE/BANDAGES/DRESSINGS) ×2 IMPLANT
SUT VIC AB 4-0 PS2 18 (SUTURE) ×2 IMPLANT
SUT VICRYL 0 AB UR-6 (SUTURE) ×4 IMPLANT
SYR 10ML LL (SYRINGE) ×2 IMPLANT
TAPE SURG TRANSPORE 1 IN (GAUZE/BANDAGES/DRESSINGS) ×1 IMPLANT
TAPE SURGICAL TRANSPORE 1 IN (GAUZE/BANDAGES/DRESSINGS) ×1

## 2020-08-21 NOTE — Anesthesia Preprocedure Evaluation (Signed)
Anesthesia Evaluation  Patient identified by MRN, date of birth, ID band Patient awake  General Assessment Comment:  Post partum day 1  Reviewed: Allergy & Precautions, NPO status , Patient's Chart, lab work & pertinent test results  History of Anesthesia Complications Negative for: history of anesthetic complications  Airway Mallampati: II  TM Distance: >3 FB Neck ROM: Full    Dental no notable dental hx. (+) Teeth Intact   Pulmonary neg pulmonary ROS, neg sleep apnea, neg COPD, Patient abstained from smoking.Not current smoker,    Pulmonary exam normal breath sounds clear to auscultation       Cardiovascular Exercise Tolerance: Good METS(-) hypertension(-) CAD and (-) Past MI negative cardio ROS  (-) dysrhythmias  Rhythm:Regular Rate:Normal - Systolic murmurs    Neuro/Psych  Headaches, PSYCHIATRIC DISORDERS Depression    GI/Hepatic GERD  Controlled,(+)     (-) substance abuse  ,   Endo/Other  diabetes, Gestational  Renal/GU negative Renal ROS     Musculoskeletal   Abdominal   Peds  Hematology   Anesthesia Other Findings Past Medical History: No date: Acne vulgaris No date: Epigastric pain No date: Gastritis No date: GERD (gastroesophageal reflux disease) No date: Gestational diabetes No date: Migraines  Reproductive/Obstetrics                             Anesthesia Physical Anesthesia Plan  ASA: 2  Anesthesia Plan: General   Post-op Pain Management:    Induction: Intravenous  PONV Risk Score and Plan: 4 or greater and Ondansetron, Dexamethasone and Midazolam  Airway Management Planned: Oral ETT  Additional Equipment: None  Intra-op Plan:   Post-operative Plan: Extubation in OR  Informed Consent: I have reviewed the patients History and Physical, chart, labs and discussed the procedure including the risks, benefits and alternatives for the proposed anesthesia with  the patient or authorized representative who has indicated his/her understanding and acceptance.     Dental advisory given  Plan Discussed with: CRNA and Surgeon  Anesthesia Plan Comments: (Discussed r/b/a of spinal anesthesia vs GETA with patient, and patient ultimately elected for GETA. Risks discussed including PONV, sore throat, lip/dental damage. Rare risks discussed as well, such as cardiorespiratory and neurological sequelae, and allergic reactions. Patient understands.)        Anesthesia Quick Evaluation

## 2020-08-21 NOTE — Interval H&P Note (Signed)
History and Physical Interval Note: Now PP - no issues.  08/21/2020 12:19 PM  Yesenia Davis  has presented today for surgery, with the diagnosis of desires sterilzation.  The various methods of treatment have been discussed with the patient and family. After consideration of risks, benefits and other options for treatment, the patient has consented to  Procedure(s): POST PARTUM TUBAL LIGATION (Bilateral) as a surgical intervention.  The patient's history has been reviewed, patient examined, no change in status, stable for surgery.  I have reviewed the patient's chart and labs.  Questions were answered to the patient's satisfaction.     Brennan Bailey

## 2020-08-21 NOTE — Lactation Note (Signed)
This note was copied from a baby's chart. Lactation Consultation Note  Patient Name: Yesenia Davis QQIWL'N Date: 08/21/2020 Reason for consult: Follow-up assessment;Term;Other (Comment) (AMA) Age:44 hours  Lactation follow-up.  Baby and mom are doing well. Mom reports continued good feeds and documented output of pees, and reports one poop this AM around 5am.   LC provided reassurance that baby is doing well at the breast based on output. Provided guidance for continued feeding on demand, early cues, and newborn feeding patterns and behaviors. Mom has scheduled BTL for 12pm- parents opt for donor milk should baby become hungry while mom is off the floor. Mom was directed to feed baby prior to leaving, and the safety of feeding once returning to the floor.  Lactation name/number updated on the whiteboard, encouraged to call with questions or for BF assistance.  Maternal Data Has patient been taught Hand Expression?: Yes Does the patient have breastfeeding experience prior to this delivery?: Yes  Feeding Mother's Current Feeding Choice: Breast Milk and Formula  LATCH Score                    Lactation Tools Discussed/Used    Interventions Interventions: Breast feeding basics reviewed;Education (feeding plan for BTL)  Discharge    Consult Status Consult Status: Follow-up Date: 08/21/20 Follow-up type: In-patient    Danford Bad 08/21/2020, 9:03 AM

## 2020-08-21 NOTE — Transfer of Care (Signed)
Immediate Anesthesia Transfer of Care Note  Patient: [name]  Procedure(s) Performed: POST PARTUM TUBAL LIGATION (Bilateral: Abdomen)  Patient Location: PACU  Anesthesia Type:General  Level of Consciousness: awake and alert   Airway & Oxygen Therapy: Patient Spontanous Breathing and Patient connected to face mask oxygen  Post-op Assessment: Report given to RN and Post -op Vital signs reviewed and stable  Post vital signs: Reviewed and stable  Last Vitals:  Vitals Value Taken Time  BP 117/72 08/21/20 1334  Temp    Pulse 70 08/21/20 1334  Resp 14 08/21/20 1334  SpO2 98 % 08/21/20 1334  Vitals shown include unvalidated device data.  Last Pain:  Vitals:   08/21/20 1131  TempSrc:   PainSc: 0-No pain      Patients Stated Pain Goal: 0 (08/21/20 1131)  Complications: No notable events documented.

## 2020-08-21 NOTE — Anesthesia Procedure Notes (Signed)
Procedure Name: Intubation Date/Time: 08/21/2020 12:36 PM Performed by: Ginger Carne, CRNA Pre-anesthesia Checklist: Patient identified, Emergency Drugs available, Suction available, Patient being monitored and Timeout performed Patient Re-evaluated:Patient Re-evaluated prior to induction Oxygen Delivery Method: Circle system utilized Preoxygenation: Pre-oxygenation with 100% oxygen Induction Type: IV induction Ventilation: Mask ventilation without difficulty Laryngoscope Size: McGraph and 3 Tube type: Oral Tube size: 6.5 mm Number of attempts: 1 Airway Equipment and Method: Stylet and Oral airway Placement Confirmation: ETT inserted through vocal cords under direct vision, positive ETCO2 and breath sounds checked- equal and bilateral Secured at: 19 cm Tube secured with: Tape Dental Injury: Teeth and Oropharynx as per pre-operative assessment

## 2020-08-21 NOTE — Op Note (Addendum)
      OPERATIVE NOTE 08/21/2020 1:30 PM  Tubal Prior to the procedure, we have discussed permanent sterilization in detail.  I have reviewed Filshie clip placement as a means of sterilization.  I have explained the risks and benefits of this procedure in detail.  I have stressed the fact that this is a permanent and not reversible procedure and there is a failure rate of 3 to7 per 1000 which is somewhat timing and method dependent.  I have discussed the possibility of inadvertent damage to bowel, bladder, blood vessels or other internal organs..  I have specifically discussed the risk of anesthesia, infection and blood loss with her.  We have discussed the possibility of laparotomy should there be a problem and the additional possibility of a longer hospital stay.  I have spoken with her regarding the recovery period, informing her that after this procedure, most people are performing their normal daily activities within one week.  I have recommended abstinence or another birth control method for 2-4 weeks following this procedure.  Other options of birth control have also been discussed.  The procedure of sterilization and alternate methods of birth control were discussed in detail with the patient.  I have answered all her questions and I believe that she has an adequate and informed understanding of the procedure.  PRE-OPERATIVE DIAGNOSIS:  1) desires sterilzation  POST-OPERATIVE DIAGNOSIS:  Same  OPERATION:  Post-partum Filshie clip tubal occlusion for sterilization  SURGEON(S): Surgeon(s) and Role:    Linzie Collin, MD - Primary   ANESTHESIA: Choice  ESTIMATED BLOOD LOSS: 2 mL  OPERATIVE FINDINGS: Normal-appearing uterine fundus, tubes  COMPLICATIONS: None  DISPOSITION: Stable to recovery room  DESCRIPTION OF PROCEDURE:     The patient was prepped and draped in the supine position and placed under general anesthesia. The bladder was emptied.  A small infraumbilical incision  was made and the fascia was identified and incised using Mayo scissors. The peritoneum was carefully identified and entered with care being taken to avoid bowel. A self retaining ring retractor was placed. The right fallopian tube was identified and followed out to its fine fimbriated end and then back approximate half the way. At this point a section of tube was elevated on a Babcock clamp and a Filshie clip was used to completely occlude the tube in a perpendicular manner. The left fallopian tube was similarly identified and again the midportion of the tube was completely occluded using a Filshie clip in a perpendicular manner. Hemostasis of the fallopian tubes as well as the pelvis was noted.  A deep suture through the fascia of 0 Vicryl followed by subcuticular closure of the skin was performed. A long-acting anesthetic was injected.  A dressing was applied. The patient went to the recovery room in stable condition.  Elonda Husky, M.D. 08/21/2020 1:30 PM

## 2020-08-21 NOTE — Progress Notes (Signed)
Progress Note - Vaginal Delivery  Yesenia Davis is a 45 y.o. S1S2395 now PP day 1 s/p Vaginal, Spontaneous .   Subjective:  The patient reports no complaints, up ad lib, voiding, and tolerating PO Having BTL this afternoon.  Objective:  Vital signs in last 24 hours: Temp:  [97.7 F (36.5 C)-98.4 F (36.9 C)] 98 F (36.7 C) (08/10 0304) Pulse Rate:  [54-292] 54 (08/10 0304) Resp:  [16-18] 18 (08/10 0304) BP: (94-122)/(62-81) 122/77 (08/10 0304) SpO2:  [97 %-99 %] 98 % (08/10 0304) Weight:  [69.9 kg] 69.9 kg (08/09 0814)  Physical Exam:  General: alert, cooperative, appears stated age, and fatigued Lochia: appropriate Uterine Fundus: firm @ u    Data Review Recent Labs    08/20/20 0808 08/21/20 0538  HGB 12.5 11.1*  HCT 37.9 33.3*    Assessment/Plan: Active Problems:   Labor and delivery, indication for care   Plan for discharge tomorrow and Breastfeeding   -- Continue routine PP care.     Doreene Burke, CNM  08/21/2020 7:45 AM

## 2020-08-21 NOTE — Anesthesia Postprocedure Evaluation (Signed)
Anesthesia Post Note  Patient: Yesenia Davis  Procedure(s) Performed: POST PARTUM TUBAL LIGATION (Bilateral: Abdomen)  Patient location during evaluation: PACU Anesthesia Type: General Level of consciousness: awake and alert Pain management: pain level controlled Vital Signs Assessment: post-procedure vital signs reviewed and stable Respiratory status: spontaneous breathing, nonlabored ventilation, respiratory function stable and patient connected to nasal cannula oxygen Cardiovascular status: blood pressure returned to baseline and stable Postop Assessment: no apparent nausea or vomiting Anesthetic complications: no   No notable events documented.   Last Vitals:  Vitals:   08/21/20 1430 08/21/20 1500  BP: 132/80 136/87  Pulse: (!) 57 75  Resp: 11 14  Temp: (!) 36.3 C 36.8 C  SpO2: 97% 98%    Last Pain:  Vitals:   08/21/20 1430  TempSrc:   PainSc: 5                  Corinda Gubler

## 2020-08-22 ENCOUNTER — Other Ambulatory Visit: Payer: Self-pay | Admitting: Certified Nurse Midwife

## 2020-08-22 ENCOUNTER — Encounter: Payer: Self-pay | Admitting: Obstetrics and Gynecology

## 2020-08-22 DIAGNOSIS — O24419 Gestational diabetes mellitus in pregnancy, unspecified control: Secondary | ICD-10-CM

## 2020-08-22 MED ORDER — OXYCODONE-ACETAMINOPHEN 5-325 MG PO TABS: 1.0000 | ORAL_TABLET | ORAL | 0 refills | Status: DC | PRN

## 2020-08-22 MED ORDER — IBUPROFEN 600 MG PO TABS: 600.0000 mg | ORAL_TABLET | Freq: Four times a day (QID) | ORAL | 0 refills | Status: DC

## 2020-08-22 NOTE — TOC Transition Note (Signed)
Transition of Care [facility]) - CM/SW Discharge Note   Patient Details  Name: Yesenia Davis MRN: [id number] Date of Birth: 28-Jan-1975  [facility] CM/SW Contact:  Hollywood Cellar, RN Phone Number: 08/22/2020, 9:42 AM   Clinical Narrative:    No TOC needs or concerns. Has pediatrician selected and will update [facility] of infant arrival. No transportation issues.          Patient Goals and CMS Choice        Discharge Placement                       Discharge Plan and Services                                     Social Determinants of Health (SDOH) Interventions     Readmission Risk Interventions No flowsheet data found.

## 2020-08-22 NOTE — Progress Notes (Signed)
Pt discharged with infant. Discharge instructions, prescriptions, and follow up appointments given to and reviewed with patient. Pt verbalized understanding. Escorted out by auxillary.  

## 2020-08-22 NOTE — Discharge Summary (Signed)
Patient Name: Yesenia Davis DOB: [birth year] MRN: [id number]                            Discharge Summary  Date of Admission: 08/20/2020 Date of Discharge: 08/22/2020 Delivering Provider: Philip Aspen   Admitting Diagnosis: Labor and delivery, indication for care [O75.9] at [redacted]w[redacted]d GDM, AMA Secondary diagnosis:  Active Problems:   Labor and delivery, indication for care   Mode of Delivery: normal spontaneous vaginal delivery              Discharge diagnosis: Term Pregnancy Delivered and Gestational diabetets  , Advance maternal age     Intrapartum Procedures: Atificial rupture of membranes, pitocin augmentation, and tubal ligation   Post partum procedures: postpartum tubal ligation  Complications:  bilateral periurethral abrasions, not repiared                     Discharge Day SOAP Note:  Progress Note - Vaginal Delivery  SElka Satterfieldis a 45y.o. [id number] PP day 2 s/p Vaginal, Spontaneous . Delivery was complicated by gestational diabetes and advanced maternal age.  Subjective  The patient has the following complaints: has no unusual complaints  Pain is controlled with current medications.   Patient is urinating without difficulty.  She is ambulating well.     Objective  Vital signs: BP 96/60 (BP Location: Left Arm)   Pulse 60   Temp 98.6 F (37 C) (Oral)   Resp 18   Ht _0  (1.626 m)   Wt 69.9 kg   LMP 11/13/2019 Comment: gave birth 08/20/20  SpO2 98% Comment: Room Air  Breastfeeding Yes   BMI 26.45 kg/m   Physical Exam: Gen: NAD Fundus Fundal Tone: Firm  Lochia Amount: Scant        Data Review Labs: Lab Results  Component Value Date   WBC 13.0 (H) 08/21/2020   HGB 11.1 (L) 08/21/2020   HCT 33.3 (L) 08/21/2020   MCV 89.0 08/21/2020   PLT 169 08/21/2020   CBC Latest Ref Rng & Units 08/21/2020 08/20/2020 06/04/2020  WBC 4.0 - 10.5 K/uL 13.0(H) 8.3 8.3  Hemoglobin 12.0 - 15.0 g/dL 11.1(L) 12.5 12.2  Hematocrit 36.0 - 46.0 % 33.3(L) 37.9  36.9  Platelets 150 - 400 K/uL 169 215 280   A POS  Edinburgh Score: Edinburgh Postnatal Depression Scale Screening Tool 08/22/2020  I have been able to laugh and see the funny side of things. 0  I have looked forward with enjoyment to things. 1  I have blamed myself unnecessarily when things went wrong. 2  I have been anxious or worried for no good reason. 2  I have felt scared or panicky for no good reason. 2  Things have been getting on top of me. 2  I have been so unhappy that I have had difficulty sleeping. 1  I have felt sad or miserable. 3  I have been so unhappy that I have been crying. 1  The thought of harming myself has occurred to me. 0  Edinburgh Postnatal Depression Scale Total 14    Assessment/Plan  Active Problems:   Labor and delivery, indication for care    Plan for discharge today.  Discharge Instructions: Per After Visit Summary. Activity: Advance as tolerated. Pelvic rest for 6 weeks.  Also refer to After Visit Summary Diet: Regular Medications: Allergies as of 08/22/2020       Reactions  Ciprofloxacin Hcl Rash        Medication List     STOP taking these medications    Accu-Chek Guide w/Device Kit   Accu-Chek SmartView test strip Generic drug: glucose blood   Accu-Chek Softclix Lancets lancets   aspirin EC 81 MG tablet   blood glucose meter kit and supplies Kit   glyBURIDE 2.5 MG tablet Commonly known as: DIABETA   glyBURIDE 5 MG tablet Commonly known as: DIABETA   magnesium oxide 400 (240 Mg) MG tablet Commonly known as: MAG-OX       TAKE these medications    [facility] Purelax 17 GM/SCOOP powder Generic drug: polyethylene glycol powder TAKE 255 G BY MOUTH ONCE FOR 1 DOSE. TO USE EVERY THIRD DAY IN NO BM OCCURS SPONTANEOUSLY   docusate sodium 100 MG capsule Commonly known as: COLACE Take 1 capsule (100 mg total) by mouth 2 (two) times daily as needed.   ibuprofen 600 MG tablet Commonly known as: ADVIL Take 1 tablet (600 mg  total) by mouth every 6 (six) hours.   oxyCODONE-acetaminophen 5-325 MG tablet Commonly known as: PERCOCET/ROXICET Take 1-2 tablets by mouth every 4 (four) hours as needed (pain scale > 7).   Prenatal 27-1 MG Tabs TAKE 1 TABLET DAILY WHILE TRYING TO CONCEIVE, PREGNANT AND BREASTFEEDING.   Vitamin D-1000 Max St 25 MCG (1000 UT) tablet Generic drug: Cholecalciferol Take 1,000 Units by mouth daily.       Outpatient follow up: 2 week tele visit, 6 wk ppv with Philip Aspen, CNM  Postpartum contraception: BTL done postpartum   Discharged Condition: good  Discharged to: home  Newborn Data: Disposition:home with mother  Apgars: APGAR (1 MIN): 8   APGAR (5 MINS): 9   APGAR (10 MINS):    Baby Feeding: Bottle and Breast    Philip Aspen, CNM  08/22/2020 10:16 AM

## 2020-08-22 NOTE — Lactation Note (Signed)
This note was copied from a baby's chart. Lactation Consultation Note  Patient Name: Yesenia Davis MEQAS'T Date: 08/22/2020 Reason for consult: Follow-up assessment;Mother's request;Term Age:44 hours  Lactation at bedside for final feeding before discharge. LC adjusted position, added support pillows, and demonstrated sandwich of tissue for deep latch.  Baby grasped breast easy, rhythmic suck pattern on/off, relaxing body language throughout feeding and baby released nipple on his own. Pointed out positives during the feeding and post feeding to demonstrate baby's fullness/satiety.  Encouraged to monitor for hunger cues, offering of other breast before offering formula supplement. Mom verbalizes understanding.   Maternal Data Has patient been taught Hand Expression?: Yes Does the patient have breastfeeding experience prior to this delivery?: Yes How long did the patient breastfeed?: 3 months  Feeding Mother's Current Feeding Choice: Breast Milk and Formula  LATCH Score Latch: Grasps breast easily, tongue down, lips flanged, rhythmical sucking.  Audible Swallowing: A few with stimulation  Type of Nipple: Everted at rest and after stimulation  Comfort (Breast/Nipple): Soft / non-tender  Hold (Positioning): Assistance needed to correctly position infant at breast and maintain latch.  LATCH Score: 8   Lactation Tools Discussed/Used Tools: Pump Breast pump type: Manual Pump Education: Setup, frequency, and cleaning;Milk Storage Reason for Pumping: additional stimulation Pumping frequency: post feedings  Interventions Interventions: Assisted with latch;Hand express;Adjust position;Support pillows;Education  Discharge Discharge Education: Engorgement and breast care;Warning signs for feeding baby;Outpatient recommendation Pump: Manual WIC Program: Yes  Consult Status Consult Status: Complete Date: 08/22/20 Follow-up type: Call as needed    Danford Bad 08/22/2020, 11:03 AM

## 2020-08-22 NOTE — Lactation Note (Signed)
This note was copied from a baby's chart. Lactation Consultation Note  Patient Name: Yesenia Davis Date: 08/22/2020 Reason for consult: Follow-up assessment;Term;Other (Comment) (AMA) Age:44 hours  Lactation follow-up prior to anticipated discharge. Mom feels that baby has not been getting enough while at the breast. Baby cluster fed, was restless and crying throughout the night. Formula given to help satisfy the baby. Mom desires to continue with breastfeeding, however states that she has history of not making enough milk. LC provided tips/strategies for helping to build supply: -On demand feedings at the breast 8-12x per 24 hrs. -Use of hand pump (given in hospital) post feedings for additional stimulation -Use of formula supplement if needed based on baby's hunger/fullness cues. Guidance and education given on paced-bottle feeding.  Mom is a recipient of  WIC, she has already made contact, hoping to confirm enrollment for Max today/tomorrow.  Mom given outpatient lactation services information, formula prep and storage guidance, cleaning of hand pump, and breast milk storage and warming.  Maternal Data Has patient been taught Hand Expression?: Yes Does the patient have breastfeeding experience prior to this delivery?: Yes How long did the patient breastfeed?: 3 months  Feeding Mother's Current Feeding Choice: Breast Milk and Formula Nipple Type: Slow - flow  LATCH Score                    Lactation Tools Discussed/Used Tools: Pump Breast pump type: Manual Pump Education: Setup, frequency, and cleaning;Milk Storage Reason for Pumping: additional stimulation Pumping frequency: post feedings  Interventions Interventions: Hand pump;Education;Hand express  Discharge Discharge Education: Engorgement and breast care;Warning signs for feeding baby;Outpatient recommendation Pump: Manual WIC Program: Yes  Consult Status Consult Status:  Complete Date: 08/22/20 Follow-up type: Call as needed    Danford Bad 08/22/2020, 10:17 AM

## 2020-08-22 NOTE — Final Progress Note (Signed)
Discharge Day SOAP Note:   Progress Note - Vaginal Delivery   Yesenia Davis is a 45 y.o. [id number] now PP day 2 s/p Vaginal, Spontaneous . Delivery was complicated by gestational diabetes and advanced maternal age.   Subjective   The patient has the following complaints: has no unusual complaints  Pain is controlled with current medications.   Patient is urinating without difficulty.  She is ambulating well.       Objective   Vital signs: BP 96/60 (BP Location: Left Arm)   Pulse 60   Temp 98.6 F (37 C) (Oral)   Resp 18   Ht _0  (1.626 m)   Wt 69.9 kg   LMP 11/13/2019 Comment: gave birth 08/20/20  SpO2 98% Comment: Room Air  Breastfeeding Yes   BMI 26.45 kg/m    Physical Exam: Gen: NAD Fundus Fundal Tone: Firm  Lochia Amount: Scant                      Data Review Labs: Recent Labs       Lab Results  Component Value Date    WBC 13.0 (H) 08/21/2020    HGB 11.1 (L) 08/21/2020    HCT 33.3 (L) 08/21/2020    MCV 89.0 08/21/2020    PLT 169 08/21/2020      CBC Latest Ref Rng & Units 08/21/2020 08/20/2020 06/04/2020  WBC 4.0 - 10.5 K/uL 13.0(H) 8.3 8.3  Hemoglobin 12.0 - 15.0 g/dL 11.1(L) 12.5 12.2  Hematocrit 36.0 - 46.0 % 33.3(L) 37.9 36.9  Platelets 150 - 400 K/uL 169 215 280    A POS   Edinburgh Score: Edinburgh Postnatal Depression Scale Screening Tool 08/22/2020  I have been able to laugh and see the funny side of things. 0  I have looked forward with enjoyment to things. 1  I have blamed myself unnecessarily when things went wrong. 2  I have been anxious or worried for no good reason. 2  I have felt scared or panicky for no good reason. 2  Things have been getting on top of me. 2  I have been so unhappy that I have had difficulty sleeping. 1  I have felt sad or miserable. 3  I have been so unhappy that I have been crying. 1  The thought of harming myself has occurred to me. 0  Edinburgh Postnatal Depression Scale Total 14      Assessment/Plan    Active Problems:   Labor and delivery, indication for care     Plan for discharge today.   Discharge Instructions: Per After Visit Summary. Activity: Advance as tolerated. Pelvic rest for 6 weeks.  Also refer to After Visit Summary Diet: Regular Medications: Allergies as of 08/22/2020         Reactions    Ciprofloxacin Hcl Rash            Medication List       STOP taking these medications     Accu-Chek Guide w/Device Kit    Accu-Chek SmartView test strip Generic drug: glucose blood    Accu-Chek Softclix Lancets lancets    aspirin EC 81 MG tablet    blood glucose meter kit and supplies Kit    glyBURIDE 2.5 MG tablet Commonly known as: DIABETA    glyBURIDE 5 MG tablet Commonly known as: DIABETA    magnesium oxide 400 (240 Mg) MG tablet Commonly known as: MAG-OX           TAKE these  medications     [employer] Purelax 17 GM/SCOOP powder Generic drug: polyethylene glycol powder TAKE 255 G BY MOUTH ONCE FOR 1 DOSE. TO USE EVERY THIRD DAY IN NO BM OCCURS SPONTANEOUSLY    docusate sodium 100 MG capsule Commonly known as: COLACE Take 1 capsule (100 mg total) by mouth 2 (two) times daily as needed.    ibuprofen 600 MG tablet Commonly known as: ADVIL Take 1 tablet (600 mg total) by mouth every 6 (six) hours.    oxyCODONE-acetaminophen 5-325 MG tablet Commonly known as: PERCOCET/ROXICET Take 1-2 tablets by mouth every 4 (four) hours as needed (pain scale > 7).    Prenatal 27-1 MG Tabs TAKE 1 TABLET DAILY WHILE TRYING TO CONCEIVE, PREGNANT AND BREASTFEEDING.    Vitamin D-1000 Max St 25 MCG (1000 UT) tablet Generic drug: Cholecalciferol Take 1,000 Units by mouth daily.           Outpatient follow up: 2 week tele visit, 6 wk ppv with Philip Aspen, CNM  Postpartum contraception: BTL done postpartum    Discharged Condition: good   Discharged to: home   Newborn Data: Disposition:home with mother   Apgars: APGAR (1 MIN): 8   APGAR (5 MINS): 9   APGAR  (10 MINS):     Baby Feeding: Bottle and Breast     Philip Aspen, CNM  08/22/2020 10:16 AM          Note Details  Author Philip Aspen, CNM File Time 08/22/2020 10:21 AM  Author Type Certified Nurse Midwife Status Cosign Needed  Last Editor Philip Aspen, [place] [facility] Certified Nurse [place] # [phone] [place] Date 08/20/2020

## 2020-08-22 NOTE — Progress Notes (Signed)
glucose

## 2020-08-22 NOTE — TOC Initial Note (Signed)
Transition of Care [facility]) - Initial/Assessment Note    Patient Details  Name: Yesenia Davis MRN: [id number] Date of Birth: Jun 02, 1975  Transition of Care [facility]) CM/SW Contact:    Seminole Cellar, RN Phone Number: 08/22/2020, 9:35 AM  Clinical Narrative:                 Spoke with patient and discussed PPD and [place] score. Patient reports she had some mild PPD after her four year old but was able to work through it. Has 3 other children ages adult, 75, 67 and the newborn. Husband lives at home and has 2 weeks off of work to assist. Patient understands risk factors of PPD and ways to seek assistance. Infant will be seen by [facility]. Patient is [facility] and no daycare needed. Planning to breastfeed but previously had problems with limited milk supply. Currently working closely with LC at hospital. Engaged with [facility] services and advised to notify [facility] office infant had been born. Patient reports she has all needed equipment except she is looking for a monitor/camera to assist. Advised to look in [employer]ft/consignment stores for cheaper options. Patient reports no needs or concerns and is happy and eager to discharge home. Recovering well from TL yesterday with pain controlled.         Patient Goals and CMS Choice        Expected Discharge Plan and Services                                                Prior Living Arrangements/Services                       Activities of Daily Living Home Assistive Devices/Equipment: None ADL Screening (condition at time of admission) Patient's cognitive ability adequate to safely complete daily activities?: Yes Is the patient deaf or have difficulty hearing?: No Does the patient have difficulty seeing, even when wearing glasses/contacts?: No Does the patient have difficulty concentrating, remembering, or making decisions?: No Patient able to express need for assistance with ADLs?: Yes Does the patient have difficulty  dressing or bathing?: No Independently performs ADLs?: Yes (appropriate for developmental age) Does the patient have difficulty walking or climbing stairs?: No Weakness of Legs: None Weakness of Arms/Hands: None  Permission Sought/Granted                  Emotional Assessment              Admission diagnosis:  Labor and delivery, indication for care [O75.9] Patient Active Problem List   Diagnosis Date Noted   Labor and delivery, indication for care 08/20/2020   Decreased fetal movement 08/18/2020   Shingles outbreak 04/25/2020   Pyelectasis of fetus on prenatal ultrasound 03/29/2020   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 [place] [address] Phone: [phone] Fax: [phone]     Social Determinants of Health (SDOH) Interventions    Readmission Risk Interventions No flowsheet data found.

## 2020-08-24 ENCOUNTER — Other Ambulatory Visit: Payer: Self-pay | Admitting: Certified Nurse Midwife

## 2020-08-26 ENCOUNTER — Telehealth: Payer: Self-pay | Admitting: Certified Nurse Midwife

## 2020-08-26 NOTE — Telephone Encounter (Signed)
Tell her to try any of the medications on the med list or she can try an enema   Thanks  [name]

## 2020-08-26 NOTE — Telephone Encounter (Signed)
Weltha called and states she hasn't had a bowel movement since 8/8.  Ravonda states Colace is not working and was wondering if something else could be prescribed.  [facility] [address]   Please advise.

## 2020-08-30 ENCOUNTER — Telehealth: Payer: Self-pay

## 2020-08-30 NOTE — Telephone Encounter (Signed)
Patient called stating she was having severe pain on 'Left side of ovaries' and 'bad vaginal pain'. Patient states she has passed large blood clots this week and that she has had bleeding. Patient was advised to go to ED for pain and plans to make an appointment with [place] to be seen next week if possible. Patient agrees with plan and to keep [place] updated.

## 2020-09-03 ENCOUNTER — Ambulatory Visit (INDEPENDENT_AMBULATORY_CARE_PROVIDER_SITE_OTHER): Payer: BC Managed Care – PPO | Admitting: Certified Nurse Midwife

## 2020-09-03 ENCOUNTER — Encounter: Payer: Self-pay | Admitting: Certified Nurse Midwife

## 2020-09-03 ENCOUNTER — Other Ambulatory Visit: Payer: Self-pay

## 2020-09-03 VITALS — BP 119/79 | HR 73 | Ht 64.0 in | Wt 140.8 lb

## 2020-09-03 DIAGNOSIS — R1030 Lower abdominal pain, unspecified: Secondary | ICD-10-CM

## 2020-09-03 DIAGNOSIS — G8918 Other acute postprocedural pain: Secondary | ICD-10-CM | POA: Insufficient documentation

## 2020-09-03 DIAGNOSIS — R109 Unspecified abdominal pain: Secondary | ICD-10-CM

## 2020-09-03 DIAGNOSIS — Z1331 Encounter for screening for depression: Secondary | ICD-10-CM

## 2020-09-03 NOTE — Progress Notes (Signed)
GYN ENCOUNTER NOTE  Subjective:       Yesenia Davis is a 45 y.o. [phone] female is here for gynecologic evaluation of the following issues:  1. Pt is 2 wks post deliver with bilateral tubal ligation. She c/o right and left lower abdominal pain that is more uncomfortable on her left side.  2 c/o vaginal bleeding      Obstetric History OB History  Gravida Para Term Preterm AB Living  5 4 4   1 4   SAB IAB Ectopic Multiple Live Births  1     0 4    # Outcome Date GA Lbr Len/2nd Weight Sex Delivery Anes PTL Lv  5 Term 08/20/20 [redacted]w[redacted]d 05:56 / 00:01 7 lb 4.4 oz (3.3 kg) M Vag-Spont None  LIV  4 Term 10/18/15 [redacted]w[redacted]d / 00:06 6 lb 14.1 oz (3.12 kg) M Vag-Spont None  LIV  3 Term 2005   7 lb 1.8 oz (3.225 kg) M Vag-Spont   LIV  2 SAB 2003          1 Term 1996   7 lb 3.2 oz (3.266 kg) F Vag-Spont   LIV    Past Medical History:  Diagnosis Date   Acne vulgaris    Epigastric pain    Gastritis    GERD (gastroesophageal reflux disease)    Gestational diabetes    Migraines     Past Surgical History:  Procedure Laterality Date   CHOLECYSTECTOMY  1996   cyst removed     from neck- b9   DILATION AND CURETTAGE OF UTERUS     TUBAL LIGATION Bilateral 08/21/2020   Procedure: POST PARTUM TUBAL LIGATION;  Surgeon: 10/21/2020, MD;  Location: [facility] ORS;  Service: Gynecology;  Laterality: Bilateral;    Current Outpatient Medications on File Prior to Visit  Medication Sig Dispense Refill   [facility] PURELAX 17 GM/SCOOP powder TAKE 255 G BY MOUTH ONCE FOR 1 DOSE. TO USE EVERY THIRD DAY IN NO BM OCCURS SPONTANEOUSLY 255 g 0   docusate sodium (COLACE) 100 MG capsule Take 1 capsule (100 mg total) by mouth 2 (two) times daily as needed. 30 capsule 2   ibuprofen (ADVIL) 600 MG tablet Take 1 tablet (600 mg total) by mouth every 6 (six) hours. 30 tablet 0   oxyCODONE-acetaminophen (PERCOCET/ROXICET) 5-325 MG tablet Take 1-2 tablets by mouth every 4 (four) hours as needed (pain scale > 7). 20 tablet 0    Prenatal 27-1 MG TABS TAKE 1 TABLET DAILY WHILE TRYING TO CONCEIVE, PREGNANT AND BREASTFEEDING.     VITAMIN D-1000 MAX ST 25 MCG (1000 UT) tablet Take 1,000 Units by mouth daily.     No current facility-administered medications on file prior to visit.    Allergies  Allergen Reactions   Ciprofloxacin Hcl Rash    Social History   Socioeconomic History   Marital status: Married    Spouse name: Misael   Number of children: Not on file   Years of education: Not on file   Highest education level: Not on file  Occupational History   Not on file  Tobacco Use   Smoking status: Never   Smokeless tobacco: Never  Vaping Use   Vaping Use: Never used  Substance and Sexual Activity   Alcohol use: No   Drug use: No   Sexual activity: Yes    Birth control/protection: Surgical    Comment: BTL  Other Topics Concern   Not on file  Social History Narrative  Not on file   Social Determinants of Health   Financial Resource Strain: Not on file  Food Insecurity: Not on file  Transportation Needs: Not on file  Physical Activity: Not on file  Stress: Not on file  Social Connections: Not on file  Intimate Partner Violence: Not on file    Family History  Problem Relation Age of Onset   Breast cancer Maternal Aunt        64's   Diabetes Father    Diabetes Mother    Diabetes Sister    Ovarian cancer Neg Hx    Colon cancer Neg Hx    Heart disease Neg Hx     The following portions of the patient's history were reviewed and updated as appropriate: allergies, current medications, past family history, past medical history, past social history, past surgical history and problem list.  Review of Systems Review of Systems - Negative except as mentioned in HPI Review of Systems - General ROS: negative for - chills, fatigue, fever, hot flashes, malaise or night sweats Hematological and Lymphatic ROS: negative for - bleeding problems or swollen lymph nodes Gastrointestinal ROS: negative for  - abdominal pain, blood in stools, change in bowel habits and nausea/vomiting Musculoskeletal ROS: negative for - joint pain, muscle pain or muscular weakness Genito-Urinary ROS: negative for - change in menstrual cycle, dysmenorrhea, dyspareunia, dysuria, genital discharge, genital ulcers, hematuria, incontinence, irregular/heavy menses, nocturia or pelvic pain.  Objective:   BP 119/79   Pulse 73   Ht 5\' 4"  (1.626 m)   Wt 140 lb 12.8 oz (63.9 kg)   Breastfeeding Yes   BMI 24.17 kg/m  CONSTITUTIONAL: Well-developed, well-nourished female in no acute distress.  HENT:  Normocephalic, atraumatic.  NECK: Normal range of motion, supple, no masses.  Normal thyroid.  SKIN: Skin is warm and dry. No rash noted. Not diaphoretic. No erythema. No pallor. NEUROLGIC: Alert and oriented to person, place, and time. PSYCHIATRIC: Normal mood and affect. Normal behavior. Normal judgment and thought content. CARDIOVASCULAR:Not Examined RESPIRATORY: Not Examined BREASTS: Not Examined ABDOMEN: Soft, non distended; Non tender.  No Organomegaly. Bowel sounds present. No guarding on exam. Pt notes slight tenderness over right and left tubal area more on left side.  PELVIC: deferred ,  MUSCULOSKELETAL: Normal range of motion. No tenderness.  No cyanosis, clubbing, or edema.   Phq9 5 Gad 3   Assessment:   Abdominal pain  Normal vaginal bleeding post delivery Mood check   Plan:   Discussed pain most likely due to tubal ligation. She is using the ibuprofen but state she does not like to  use the stronger medication because it makes her sleepy. Discussed warm compress to area, adding tylenol for pain, and using belly band for abdominal support. Bleeding is normal she pass some small pea sized clots but over all state bleeding has declined in amount. She denies signs of infection ie fever or mal odors discharge. Reassurance given. Pt to follow up 4 wk as scheduled for 6 wk ppv or sooner if need.   , CNM

## 2020-09-06 ENCOUNTER — Encounter: Payer: BC Managed Care – PPO | Admitting: Certified Nurse Midwife

## 2020-10-01 ENCOUNTER — Ambulatory Visit (INDEPENDENT_AMBULATORY_CARE_PROVIDER_SITE_OTHER): Payer: BC Managed Care – PPO | Admitting: Certified Nurse Midwife

## 2020-10-01 ENCOUNTER — Other Ambulatory Visit: Payer: BC Managed Care – PPO

## 2020-10-01 ENCOUNTER — Other Ambulatory Visit: Payer: Self-pay

## 2020-10-01 ENCOUNTER — Encounter: Payer: Self-pay | Admitting: Certified Nurse Midwife

## 2020-10-01 DIAGNOSIS — O9270 Unspecified disorders of lactation: Secondary | ICD-10-CM

## 2020-10-01 DIAGNOSIS — O24419 Gestational diabetes mellitus in pregnancy, unspecified control: Secondary | ICD-10-CM

## 2020-10-01 NOTE — Progress Notes (Signed)
Subjective:    Yesenia Davis is a 45 y.o. [id number] Hispanic female who presents for a postpartum visit. She is 6 weeks postpartum following a spontaneous vaginal delivery and and BTL  at 39.4 gestational weeks. Anesthesia: epidural. I have fully reviewed the prenatal and intrapartum course. Postpartum course has been normal. Baby's course has been normal. Baby is feeding by breast. Bleeding no bleeding. Bowel function is normal. Bladder function is normal. Patient is not sexually active. Last sexual activity: prior to delivery. Contraception method is tubal ligation. Postpartum depression screening: negative. Score 4.  Last pap 02/09/18 and was negative/negative.  The following portions of the patient's history were reviewed and updated as appropriate: allergies, current medications, past medical history, past surgical history and problem list.  Review of Systems Pertinent items are noted in HPI.   Vitals:   10/01/20 1021  BP: 111/75  Pulse: 60  Weight: 141 lb 4.8 oz (64.1 kg)  Height: 5\' 3"  (1.6 m)   No LMP recorded.  Objective:   General:  alert, cooperative and no distress   Breasts:  deferred, no complaints  Lungs: clear to auscultation bilaterally  Heart:  regular rate and rhythm  Abdomen: soft, nontender   Vulva: normal  Vagina: normal vagina  Cervix:  closed  Corpus: Well-involuted  Adnexa:  Non-palpable  Rectal Exam: no hemorrhoids        Assessment:   Postpartum exam 6 wks s/p SVD & BTL Breastfeeding Depression screening Contraception counseling   Plan:  : tubal ligation Follow up in: 9 months for annual or earlier if needed. Referral to lactation for milk production.   ,  CNM

## 2020-10-02 LAB — GLUCOSE TOLERANCE, 2 HOURS
Glucose, 2 hour: 166 mg/dL — ABNORMAL HIGH (ref 65–139)
Glucose, GTT - Fasting: 99 mg/dL (ref 65–99)

## 2020-10-08 ENCOUNTER — Telehealth: Payer: Self-pay | Admitting: Certified Nurse Midwife

## 2020-10-08 ENCOUNTER — Other Ambulatory Visit: Payer: Self-pay | Admitting: Certified Nurse Midwife

## 2020-10-08 DIAGNOSIS — R7303 Prediabetes: Secondary | ICD-10-CM

## 2020-10-08 NOTE — Telephone Encounter (Signed)
Yesenia Davis called to check on the status of her lactation referral and stated that Yesenia Davis reached out to her about her glucose test and suggested she see a nutritionist.  Yesenia Davis would like a referral for a nutritionist.

## 2021-01-09 ENCOUNTER — Ambulatory Visit: Payer: BC Managed Care – PPO | Admitting: Dietician

## 2021-04-08 ENCOUNTER — Encounter: Payer: Self-pay | Admitting: Certified Nurse Midwife

## 2021-04-08 ENCOUNTER — Ambulatory Visit (INDEPENDENT_AMBULATORY_CARE_PROVIDER_SITE_OTHER): Payer: BC Managed Care – PPO | Admitting: Certified Nurse Midwife

## 2021-04-08 ENCOUNTER — Other Ambulatory Visit: Payer: Self-pay

## 2021-04-08 VITALS — BP 111/73 | HR 69 | Ht 64.0 in | Wt 147.4 lb

## 2021-04-08 DIAGNOSIS — Z8632 Personal history of gestational diabetes: Secondary | ICD-10-CM | POA: Diagnosis not present

## 2021-04-08 DIAGNOSIS — Z1231 Encounter for screening mammogram for malignant neoplasm of breast: Secondary | ICD-10-CM | POA: Diagnosis not present

## 2021-04-08 DIAGNOSIS — Z01419 Encounter for gynecological examination (general) (routine) without abnormal findings: Secondary | ICD-10-CM | POA: Diagnosis not present

## 2021-04-08 NOTE — Patient Instructions (Signed)

## 2021-04-08 NOTE — Progress Notes (Signed)
? ? ?GYNECOLOGY ANNUAL PREVENTATIVE CARE ENCOUNTER NOTE ? ?History:    ? Yesenia Davis is a 46 y.o. [id number] female here for a routine annual gynecologic exam.  Current complaints: none.   Denies abnormal vaginal bleeding, discharge, pelvic pain, problems with intercourse or other gynecologic concerns.  ?  ? ?Social ?Relationship: married  ?Living: spouse and children  ?Work: [employer] ?Exercise: none ?Smoke/Alcohol/drug use: denies use  ? ?Gynecologic History ?No LMP recorded (lmp unknown). ?Contraception: tubal ligation ?Last Pap: 02/09/18. Results were: normal with negative HPV ?Last mammogram: 03/17/2019. Results were: normal ? ?Obstetric History ?OB History  ?Gravida Para Term Preterm AB Living  ?5 4 4   1 4   ?SAB IAB Ectopic Multiple Live Births  ?1     0 4  ?  ?# Outcome Date GA Lbr Len/2nd Weight Sex Delivery Anes PTL Lv  ?5 Term 08/20/20 [redacted]w[redacted]d 05:56 / 00:01 7 lb 4.4 oz (3.3 kg) M Vag-Spont None  LIV  ?4 Term 10/18/15 [redacted]w[redacted]d / 00:06 6 lb 14.1 oz (3.12 kg) M Vag-Spont None  LIV  ?3 Term 2005   7 lb 1.8 oz (3.225 kg) M Vag-Spont   LIV  ?2 SAB 2003          ?1 Term 1996   7 lb 3.2 oz (3.266 kg) F Vag-Spont   LIV  ? ? ?Past Medical History:  ?Diagnosis Date  ? Acne vulgaris   ? Epigastric pain   ? Gastritis   ? GERD (gastroesophageal reflux disease)   ? Gestational diabetes   ? Migraines   ? ? ?Past Surgical History:  ?Procedure Laterality Date  ? CHOLECYSTECTOMY  1996  ? cyst removed    ? from neck- b9  ? DILATION AND CURETTAGE OF UTERUS    ? TUBAL LIGATION Bilateral 08/21/2020  ? Procedure: POST PARTUM TUBAL LIGATION;  Surgeon: 10/21/2020, MD;  Location: [facility] OR[facility];  Service: Gynecology;  Laterality: Bilateral;  ? ? ?Current Outpatient Medications on File Prior to Visit  ?Medication Sig Dispense Refill  ? Prenatal 27-1 MG TABS TAKE 1 TABLET DAILY WHILE TRYING TO CONCEIVE, PREGNANT AND BREASTFEEDING.    ? [employer] PURELAX 17 GM/SCOOP powder TAKE 255 G BY MOUTH ONCE FOR 1 DOSE. TO USE EVERY THIRD DAY IN  NO BM OCCURS SPONTANEOUSLY (Patient not taking: Reported on 04/08/2021) 255 g 0  ? docusate sodium (COLACE) 100 MG capsule Take 1 capsule (100 mg total) by mouth 2 (two) times daily as needed. (Patient not taking: Reported on 04/08/2021) 30 capsule 2  ? ibuprofen (ADVIL) 600 MG tablet Take 1 tablet (600 mg total) by mouth every 6 (six) hours. (Patient not taking: Reported on 04/08/2021) 30 tablet 0  ? oxyCODONE-acetaminophen (PERCOCET/ROXICET) 5-325 MG tablet Take 1-2 tablets by mouth every 4 (four) hours as needed (pain scale > 7). (Patient not taking: Reported on 04/08/2021) 20 tablet 0  ? VITAMIN D-1000 MAX ST 25 MCG (1000 UT) tablet Take 1,000 Units by mouth daily. (Patient not taking: Reported on 04/08/2021)    ? ?No current facility-administered medications on file prior to visit.  ? ? ?Allergies  ?Allergen Reactions  ? Ciprofloxacin Hcl Rash  ? ? ?Social History:  reports that she has never smoked. She has never used smokeless tobacco. She reports that she does not drink alcohol and does not use drugs. ? ?Family History  ?Problem Relation Age of Onset  ? Breast cancer Maternal Aunt   ?     40's  ?  Diabetes Father   ? Diabetes Mother   ? Diabetes Sister   ? Ovarian cancer Neg Hx   ? Colon cancer Neg Hx   ? Heart disease Neg Hx   ? ? ?The following portions of the patient's history were reviewed and updated as appropriate: allergies, current medications, past family history, past medical history, past social history, past surgical history and problem list. ? ?Review of Systems ?Pertinent items noted in HPI and remainder of comprehensive ROS otherwise negative. ? ?Physical Exam:  ?BP 111/73   Pulse 69   Ht 5\' 4"  (1.626 m)   Wt 147 lb 6.4 oz (66.9 kg)   LMP  (LMP Unknown)   Breastfeeding Yes   BMI 25.30 kg/m?  ?CONSTITUTIONAL: Well-developed, well-nourished female in no acute distress.  ?HENT:  Normocephalic, atraumatic, External right and left ear normal. Oropharynx is clear and moist ?EYES: Conjunctivae and  EOM are normal. Pupils are equal, round, and reactive to light. No scleral icterus.  ?NECK: Normal range of motion, supple, no masses.  Normal thyroid.  ?SKIN: Skin is warm and dry. No rash noted. Not diaphoretic. No erythema. No pallor. ?MUSCULOSKELETAL: Normal range of motion. No tenderness.  No cyanosis, clubbing, or edema.  2+ distal pulses. ?NEUROLOGIC: Alert and oriented to person, place, and time. Normal reflexes, muscle tone coordination.  ?PSYCHIATRIC: Normal mood and affect. Normal behavior. Normal judgment and thought content. ?CARDIOVASCULAR: Normal heart rate noted, regular rhythm ?RESPIRATORY: Clear to auscultation bilaterally. Effort and breath sounds normal, no problems with respiration noted. ?BREASTS: Symmetric in size. No masses, tenderness, skin changes, nipple drainage, or lymphadenopathy bilaterally.  ?ABDOMEN: Soft, no distention noted.  No tenderness, rebound or guarding.  ?PELVIC: Normal appearing external genitalia and urethral meatus; normal appearing vaginal mucosa and cervix.  No abnormal discharge noted.  Pap smear not due.  Normal uterine size, no other palpable masses, no uterine or adnexal tenderness.  . ?  ?Assessment and Plan:  ?  1. Well woman exam with routine gynecological exam ? ? ?Pap: not due until 2025 ?Mammogram : ordered ?Labs:  Hem A1c ?Refills: none ?Referral: none ?Declines colonoscopy this year ?Routine preventative health maintenance measures emphasized. ?Please refer to After Visit Summary for other counseling recommendations.  ?   ? ?2026, CNM ?[facility] ?[facility],  [facility]   ?

## 2021-04-09 ENCOUNTER — Encounter: Payer: Self-pay | Admitting: Certified Nurse Midwife

## 2021-04-09 LAB — HEMOGLOBIN A1C
Est. average glucose Bld gHb Est-mCnc: 126 mg/dL
Hgb A1c MFr Bld: 6 % — ABNORMAL HIGH (ref 4.8–5.6)

## 2021-04-11 ENCOUNTER — Other Ambulatory Visit: Payer: Self-pay | Admitting: Certified Nurse Midwife

## 2021-04-11 DIAGNOSIS — R7303 Prediabetes: Secondary | ICD-10-CM

## 2021-04-24 ENCOUNTER — Telehealth: Payer: Self-pay | Admitting: Certified Nurse Midwife

## 2021-04-24 NOTE — Telephone Encounter (Signed)
Pt is following up on the referral to see the nutritionist.  ?

## 2021-04-27 ENCOUNTER — Other Ambulatory Visit: Payer: Self-pay | Admitting: Certified Nurse Midwife

## 2021-04-27 DIAGNOSIS — R7303 Prediabetes: Secondary | ICD-10-CM

## 2021-06-05 ENCOUNTER — Ambulatory Visit
Admission: RE | Admit: 2021-06-05 | Discharge: 2021-06-05 | Disposition: A | Discharge status: Home / Self Care | Payer: BC Managed Care – PPO | Source: Ambulatory Visit | Attending: Certified Nurse Midwife | Admitting: Certified Nurse Midwife

## 2021-06-05 ENCOUNTER — Encounter: Payer: Self-pay | Admitting: Radiology

## 2021-06-05 DIAGNOSIS — Z1231 Encounter for screening mammogram for malignant neoplasm of breast: Secondary | ICD-10-CM | POA: Diagnosis present

## 2021-06-05 DIAGNOSIS — Z01419 Encounter for gynecological examination (general) (routine) without abnormal findings: Secondary | ICD-10-CM | POA: Diagnosis not present

## 2021-06-17 ENCOUNTER — Encounter: Payer: Self-pay | Admitting: Certified Nurse Midwife

## 2021-06-17 ENCOUNTER — Ambulatory Visit (INDEPENDENT_AMBULATORY_CARE_PROVIDER_SITE_OTHER): Payer: BC Managed Care – PPO | Admitting: Certified Nurse Midwife

## 2021-06-17 VITALS — BP 103/67 | HR 75 | Ht 64.0 in | Wt 149.3 lb

## 2021-06-17 DIAGNOSIS — R1030 Lower abdominal pain, unspecified: Secondary | ICD-10-CM

## 2021-06-17 DIAGNOSIS — N8189 Other female genital prolapse: Secondary | ICD-10-CM | POA: Diagnosis not present

## 2021-06-17 NOTE — Patient Instructions (Signed)
Kegel Exercises  Kegel exercises can help strengthen your pelvic floor muscles. The pelvic floor is a group of muscles that support your rectum, small intestine, and bladder. In females, pelvic floor muscles also help support the uterus. These muscles help you control the flow of urine and stool (feces). Kegel exercises are painless and simple. They do not require any equipment. Your provider may suggest Kegel exercises to: Improve bladder and bowel control. Improve sexual response. Improve weak pelvic floor muscles after surgery to remove the uterus (hysterectomy) or after pregnancy, in females. Improve weak pelvic floor muscles after prostate gland removal or surgery, in males. Kegel exercises involve squeezing your pelvic floor muscles. These are the same muscles you squeeze when you try to stop the flow of urine or keep from passing gas. The exercises can be done while sitting, standing, or lying down, but it is best to vary your position. Ask your health care provider which exercises are safe for you. Do exercises exactly as told by your health care provider and adjust them as directed. Do not begin these exercises until told by your health care provider. Exercises How to do Kegel exercises: Squeeze your pelvic floor muscles tight. You should feel a tight lift in your rectal area. If you are a female, you should also feel a tightness in your vaginal area. Keep your stomach, buttocks, and legs relaxed. Hold the muscles tight for up to 10 seconds. Breathe normally. Relax your muscles for up to 10 seconds. Repeat as told by your health care provider. Repeat this exercise daily as told by your health care provider. Continue to do this exercise for at least 4-6 weeks, or for as long as told by your health care provider. You may be referred to a physical therapist who can help you learn more about how to do Kegel exercises. Depending on your condition, your health care provider may  recommend: Varying how long you squeeze your muscles. Doing several sets of exercises every day. Doing exercises for several weeks. Making Kegel exercises a part of your regular exercise routine. This information is not intended to replace advice given to you by your health care provider. Make sure you discuss any questions you have with your health care provider. Document Revised: 05/09/2020 Document Reviewed: 05/09/2020 Elsevier Patient Education  2023 [employer] Inc[employer]  

## 2021-06-17 NOTE — Progress Notes (Signed)
GYN ENCOUNTER NOTE  Subjective:       Yesenia Davis is a 46 y.o. [id number] female is here for gynecologic evaluation of the following issues:  1. Feels like uterus is lower.   She denies issues with voiding or with bowel movement. Notes more pressure when she is sitting up right. She has bilateral lower quadrant pain with a history of ovarian cyst. She had recent delivery last year.    Gynecologic History No LMP recorded (lmp unknown). Contraception: none Last Pap: 02/09/2018. Results were: normal/negative HPV Last mammogram: 06/06/2021. Results were: normal  Obstetric History OB History  Gravida Para Term Preterm AB Living  5 4 4   1 4   SAB IAB Ectopic Multiple Live Births  1     0 4    # Outcome Date GA Lbr Len/2nd Weight Sex Delivery Anes PTL Lv  5 Term 08/20/20 [redacted]w[redacted]d 05:56 / 00:01 7 lb 4.4 oz (3.3 kg) M Vag-Spont None  LIV  4 Term 10/18/15 [redacted]w[redacted]d / 00:06 6 lb 14.1 oz (3.12 kg) M Vag-Spont None  LIV  3 Term 2005   7 lb 1.8 oz (3.225 kg) M Vag-Spont   LIV  2 SAB 2003          1 Term 1996   7 lb 3.2 oz (3.266 kg) F Vag-Spont   LIV    Past Medical History:  Diagnosis Date   Acne vulgaris    Epigastric pain    Gastritis    GERD (gastroesophageal reflux disease)    Gestational diabetes    Migraines     Past Surgical History:  Procedure Laterality Date   CHOLECYSTECTOMY  1996   cyst removed     from neck- b9   DILATION AND CURETTAGE OF UTERUS     TUBAL LIGATION Bilateral 08/21/2020   Procedure: POST PARTUM TUBAL LIGATION;  Surgeon: 10/21/2020, MD;  Location: [facility] OR[facility];  Service: Gynecology;  Laterality: Bilateral;    Current Outpatient Medications on File Prior to Visit  Medication Sig Dispense Refill   Prenatal 27-1 MG TABS TAKE 1 TABLET DAILY WHILE TRYING TO CONCEIVE, PREGNANT AND BREASTFEEDING.     No current facility-administered medications on file prior to visit.    Allergies  Allergen Reactions   Ciprofloxacin Hcl Rash    Social History    Socioeconomic History   Marital status: Married    Spouse name: Misael   Number of children: Not on file   Years of education: Not on file   Highest education level: Not on file  Occupational History   Not on file  Tobacco Use   Smoking status: Never   Smokeless tobacco: Never  Vaping Use   Vaping Use: Never used  Substance and Sexual Activity   Alcohol use: No   Drug use: No   Sexual activity: Yes    Birth control/protection: Surgical    Comment: BTL  Other Topics Concern   Not on file  Social History Narrative   Not on file   Social Determinants of Health   Financial Resource Strain: Not on file  Food Insecurity: Not on file  Transportation Needs: Not on file  Physical Activity: Not on file  Stress: Not on file  Social Connections: Not on file  Intimate Partner Violence: Not on file    Family History  Problem Relation Age of Onset   Breast cancer Maternal Aunt        72's   Diabetes Father    Diabetes Mother  Diabetes Sister    Ovarian cancer Neg Hx    Colon cancer Neg Hx    Heart disease Neg Hx     The following portions of the patient's history were reviewed and updated as appropriate: allergies, current medications, past family history, past medical history, past social history, past surgical history and problem list.  Review of Systems Review of Systems - Negative except as mentioned in HPI Review of Systems - General ROS: negative for - chills, fatigue, fever, hot flashes, malaise or night sweats Hematological and Lymphatic ROS: negative for - bleeding problems or swollen lymph nodes Gastrointestinal ROS: negative for - abdominal pain, blood in stools, change in bowel habits and nausea/vomiting Musculoskeletal ROS: negative for - joint pain, muscle pain or muscular weakness Genito-Urinary ROS: negative for - change in menstrual cycle, dysmenorrhea, dyspareunia, dysuria, genital discharge, genital ulcers, hematuria, incontinence, irregular/heavy  menses, nocturia. Positive for  pelvic pain, vaginal pressure  Objective:   BP 103/67   Pulse 75   Ht 5\' 4"  (1.626 m)   Wt 149 lb 4.8 oz (67.7 kg)   LMP  (LMP Unknown)   BMI 25.63 kg/m  CONSTITUTIONAL: Well-developed, well-nourished female in no acute distress.  HENT:  Normocephalic, atraumatic.  NECK: Normal range of motion, supple, no masses.  Normal thyroid.  SKIN: Skin is warm and dry. No rash noted. Not diaphoretic. No erythema. No pallor. NEUROLGIC: Alert and oriented to person, place, and time. PSYCHIATRIC: Normal mood and affect. Normal behavior. Normal judgment and thought content. CARDIOVASCULAR:Not Examined RESPIRATORY: Not Examined BREASTS: Not Examined ABDOMEN: Soft, non distended; Non tender.  No Organomegaly. PELVIC:  External Genitalia: Normal  BUS: Normal,   Vagina: Normal, normal vaginal tissue atrophy.  Cervix: Normal, lower in vagina  Uterus: Normal size, shape,consistency, mobile  Adnexa: Normal  RV: Normal , no sign of rectocele   Bladder: Nontender, no signs of cystocele MUSCULOSKELETAL: Normal range of motion. No tenderness.  No cyanosis, clubbing, or edema.   Assessment:   Pelvic floor weakness   Plan:   Discussed use of Kegel exercises and physical therapy for pelvic floor. Orders placed for u/s for evaluation of lower quadrant pain.  Will follow up with results. Orders placed for pelvic floor .   , CNM

## 2021-06-23 ENCOUNTER — Ambulatory Visit (INDEPENDENT_AMBULATORY_CARE_PROVIDER_SITE_OTHER): Payer: BC Managed Care – PPO

## 2021-06-23 DIAGNOSIS — R1030 Lower abdominal pain, unspecified: Secondary | ICD-10-CM

## 2021-06-26 ENCOUNTER — Encounter: Payer: BC Managed Care – PPO | Attending: Certified Nurse Midwife | Admitting: Dietician

## 2021-06-26 ENCOUNTER — Encounter: Payer: Self-pay | Admitting: Dietician

## 2021-06-26 VITALS — Ht 64.0 in | Wt 148.5 lb

## 2021-06-26 DIAGNOSIS — R7303 Prediabetes: Secondary | ICD-10-CM

## 2021-06-26 NOTE — Patient Instructions (Addendum)
When making smoothies, use no more than 1 cup of fruit, and limit any extra sugar (including cane sugar) to 1 teaspoon or less.  Continue to control portions of starchy foods/ carbs. Goal for each meal is 30-45grams of carbohydrate Stay active as much as possible during the day.

## 2021-06-26 NOTE — Progress Notes (Signed)
Medical Nutrition Therapy: Visit start time: 1540  end time: 1640  Assessment:  Diagnosis: pre-diabetes Past medical history: migraines Psychosocial issues/ stress concerns: none  Preferred learning method:  Auditory-- discussion Hands-on   Current weight: 148.5lbs Height: 5'4" BMI: 25.49  Medications, supplements: reconciled list in medical record  Progress and evaluation:  Patient reports both parents have Type 2 diabetes; sister has pre-diabetes She reports making some diet changes in the past week, feels weak and hungry  Delivered baby 08/2020, no longer breastfeeding. She had gestational diabetes during her pregnancy and followed diet carefully. After delivery, she resumed eating some foods she avoided during pregnancy which led to some weight gain.  Patient reports pre-pregnancy weight was 142lbs. She would like to get down to about 135lbs ideally. HbA1C of 6.0% on 04/08/21  Physical activity: nor regular structured exercise; light on the job activity, full time. Tries to sit about 3 hours during the day, otherwise on feet  Dietary Intake:  Usual eating pattern includes 3 meals and 2 snacks per day. Dining out frequency: 1 meals per week.  Breakfast: ham and fruit, maybe saltine crackers; egg and grits and 1 pc bread Snack: same as pm Lunch: noodle soup with veg and boiled egg, tortillas, mango; pork ribs with hard tortilla, small portion rice Snack: saltine crackers; fruit (strawberries, grapes) or fruit gummies Supper: 6/13 cereal; sometimes cooks ie soup, meat + rice Snack: none; rarely popsicle or ice cream Beverages: water, rarely juice or tea or small portion soda 1-2x a week -- some heartburn with soda  Intervention:   Nutrition Care Education:   Basic nutrition: basic food groups; appropriate nutrient balance; appropriate meal and snack schedule; general nutrition guidelines    Weight control: importance of low sugar and low fat choices, portion control; estimated  energy needs for gradual weight loss at 1400kcal, provided guidance for 45%  CHO (no specific limits set for protein or fat) Pre-Diabetes:  goal for HbA1C; appropriate meal and snack schedule; appropriate carb intake and balance, healthy carb choices, role of fiber, protein, fat; limiting or avoiding sugar sweetened drinks and sugar free alternatives; role of physical activity  Other Intervention Notes: Patient has begun making changes to control carb intake and food portions. She is motivated to continue.  Established additional goals for change with input from patient. No follow up needed at this time; patient to schedule later should circumstances change.   Nutritional Diagnosis:  [place]-2.2 Altered nutrition-related laboratory As related to pre-diabetes.  As evidenced by elevated HbA1C.   Education Materials given:  General diet guidelines for Diabetes (prevention) Plate Planner with food lists, sample meal pattern Sample menus Carb-mindful smoothie recipe template Visit summary with goals/ instructions   Learner/ who was taught:  Patient    Level of understanding: Verbalizes/ demonstrates competency   Demonstrated degree of understanding via:   Teach back Learning barriers: None  Willingness to learn/ readiness for change: Eager, change in progress   Monitoring and Evaluation:  Dietary intake, exercise, BG control, and body weight      follow up: prn

## 2021-07-07 ENCOUNTER — Encounter: Payer: Self-pay | Admitting: Certified Nurse Midwife

## 2021-07-08 ENCOUNTER — Telehealth: Payer: Self-pay | Admitting: Certified Nurse Midwife

## 2021-07-09 ENCOUNTER — Ambulatory Visit (INDEPENDENT_AMBULATORY_CARE_PROVIDER_SITE_OTHER): Payer: BC Managed Care – PPO | Admitting: Certified Nurse Midwife

## 2021-07-09 ENCOUNTER — Encounter: Payer: Self-pay | Admitting: Certified Nurse Midwife

## 2021-07-09 VITALS — BP 128/88 | HR 92 | Ht 64.0 in | Wt 148.0 lb

## 2021-07-09 DIAGNOSIS — R42 Dizziness and giddiness: Secondary | ICD-10-CM | POA: Diagnosis not present

## 2021-07-09 NOTE — Progress Notes (Signed)
GYN ENCOUNTER NOTE  Subjective:       Yesenia Davis is a 46 y.o. [id number] female is here for gynecologic evaluation of the following issues:  1. Heavy bleeding since starting her period back after having her baby. States they last 7-10 days , she uses pad and tampons that she has to change q 2 hours. She passes 1/2 dollar size blood clots. States that recently she had a dizzy spell. She also experiences sharp cramping with the period that she take Tylenol for that helps.     Gynecologic History No LMP recorded (lmp unknown). Contraception: BTL Last Pap: 02/09/2018. Results were: normal Last mammogram: 06/05/21. Results were: normal  Obstetric History OB History  Gravida Para Term Preterm AB Living  5 4 4   1 4   SAB IAB Ectopic Multiple Live Births  1     0 4    # Outcome Date GA Lbr Len/2nd Weight Sex Delivery Anes PTL Lv  5 Term 08/20/20 [redacted]w[redacted]d 05:56 / 00:01 7 lb 4.4 oz (3.3 kg) M Vag-Spont None  LIV  4 Term 10/18/15 [redacted]w[redacted]d / 00:06 6 lb 14.1 oz (3.12 kg) M Vag-Spont None  LIV  3 Term 2005   7 lb 1.8 oz (3.225 kg) M Vag-Spont   LIV  2 SAB 2003          1 Term 1996   7 lb 3.2 oz (3.266 kg) F Vag-Spont   LIV    Past Medical History:  Diagnosis Date   Acne vulgaris    Epigastric pain    Gastritis    GERD (gastroesophageal reflux disease)    Gestational diabetes    Migraines     Past Surgical History:  Procedure Laterality Date   CHOLECYSTECTOMY  1996   cyst removed     from neck- b9   DILATION AND CURETTAGE OF UTERUS     TUBAL LIGATION Bilateral 08/21/2020   Procedure: POST PARTUM TUBAL LIGATION;  Surgeon: 10/21/2020, MD;  Location: [facility];  Service: Gynecology;  Laterality: Bilateral;    Current Outpatient Medications on File Prior to Visit  Medication Sig Dispense Refill   acetaminophen (TYLENOL) 325 MG tablet Take 650 mg by mouth every 6 (six) hours as needed for moderate pain.     Prenatal 27-1 MG TABS TAKE 1 TABLET DAILY WHILE TRYING TO CONCEIVE, PREGNANT AND  BREASTFEEDING.     No current facility-administered medications on file prior to visit.    Allergies  Allergen Reactions   Ciprofloxacin Hcl Rash    Social History   Socioeconomic History   Marital status: Married    Spouse name: Misael   Number of children: Not on file   Years of education: Not on file   Highest education level: Not on file  Occupational History   Not on file  Tobacco Use   Smoking status: Never   Smokeless tobacco: Never  Vaping Use   Vaping Use: Never used  Substance and Sexual Activity   Alcohol use: No   Drug use: No   Sexual activity: Yes    Birth control/protection: Surgical    Comment: BTL  Other Topics Concern   Not on file  Social History Narrative   Not on file   Social Determinants of Health   Financial Resource Strain: Not on file  Food Insecurity: Not on file  Transportation Needs: Not on file  Physical Activity: Not on file  Stress: Not on file  Social Connections: Not on file  Intimate Partner Violence: Not on file    Family History  Problem Relation Age of Onset   Breast cancer Maternal Aunt        40's   Diabetes Father    Diabetes Mother    Diabetes Sister    Ovarian cancer Neg Hx    Colon cancer Neg Hx    Heart disease Neg Hx     The following portions of the patient's history were reviewed and updated as appropriate: allergies, current medications, past family history, past medical history, past social history, past surgical history and problem list.  Review of Systems Review of Systems - Negative except as mentioned in HPI Review of Systems - General ROS: negative for - chills, fatigue, fever, hot flashes, malaise or night sweats Hematological and Lymphatic ROS: negative for - bleeding problems or swollen lymph nodes Gastrointestinal ROS: negative for - abdominal pain, blood in stools, change in bowel habits and nausea/vomiting Musculoskeletal ROS: negative for - joint pain, muscle pain or muscular  weakness Genito-Urinary ROS: negative for - change in menstrual cycle, dysmenorrhea, dyspareunia, dysuria, genital discharge, genital ulcers, hematuria, incontinence, irregular menses, nocturia or pelvic pain. Positive for Heavy painful periods.   Objective:   BP 128/88   Pulse 92   Ht 5\' 4"  (1.626 m)   Wt 148 lb (67.1 kg)   LMP  (LMP Unknown)   Breastfeeding Yes   BMI 25.40 kg/m  CONSTITUTIONAL: Well-developed, well-nourished female in no acute distress.  HENT:  Normocephalic, atraumatic.  NECK: Normal range of motion, supple, no masses.  Normal thyroid.  SKIN: Skin is warm and dry. No rash noted. Not diaphoretic. No erythema. No pallor. NEUROLGIC: Alert and oriented to person, place, and time. PSYCHIATRIC: Normal mood and affect. Normal behavior. Normal judgment and thought content. CARDIOVASCULAR:Not Examined RESPIRATORY: Not Examined BREASTS: Not Examined ABDOMEN: Soft, non distended; Non tender.  No Organomegaly. PELVIC:not medically necessary  MUSCULOSKELETAL: Normal range of motion. No tenderness.  No cyanosis, clubbing, or edema.     Assessment:   Dysmenorrhea   Plan:   Discussed evaluating for anemia given her recent episode of light headedness. She will return for labs later this week. Discussed treatment option of progestin only BC or ablation to control cycle. Information given. Discussed each option IUD, Neplanon , Depo provera, and , POP. Pt state she has used depo and nexplanon in the past and did not like them as them made her bleed more and gain weight. She is considering use of POP. She will reach out to me via my chart message if she would like a prescription to use POP.    Face to face time 15 min.  , CNM

## 2021-07-10 ENCOUNTER — Other Ambulatory Visit: Payer: BC Managed Care – PPO

## 2021-07-11 ENCOUNTER — Encounter: Payer: Self-pay | Admitting: Certified Nurse Midwife

## 2021-07-11 LAB — CBC
Hematocrit: 38.7 % (ref 34.0–46.6)
Hemoglobin: 13.1 g/dL (ref 11.1–15.9)
MCH: 29.5 pg (ref 26.6–33.0)
MCHC: 33.9 g/dL (ref 31.5–35.7)
MCV: 87 fL (ref 79–97)
Platelets: 311 10*3/uL (ref 150–450)
RBC: 4.44 x10E6/uL (ref 3.77–5.28)
RDW: 12.7 % (ref 11.7–15.4)
WBC: 6.9 10*3/uL (ref 3.4–10.8)

## 2021-07-11 LAB — FERRITIN: Ferritin: 25 ng/mL (ref 15–150)

## 2021-07-14 ENCOUNTER — Ambulatory Visit: Payer: BC Managed Care – PPO | Attending: Certified Nurse Midwife

## 2021-07-14 DIAGNOSIS — M6289 Other specified disorders of muscle: Secondary | ICD-10-CM | POA: Diagnosis present

## 2021-07-14 DIAGNOSIS — M533 Sacrococcygeal disorders, not elsewhere classified: Secondary | ICD-10-CM | POA: Insufficient documentation

## 2021-07-14 DIAGNOSIS — N8189 Other female genital prolapse: Secondary | ICD-10-CM | POA: Insufficient documentation

## 2021-07-14 DIAGNOSIS — R278 Other lack of coordination: Secondary | ICD-10-CM | POA: Diagnosis present

## 2021-07-14 DIAGNOSIS — M6281 Muscle weakness (generalized): Secondary | ICD-10-CM | POA: Diagnosis present

## 2021-07-14 NOTE — Therapy (Signed)
OUTPATIENT PHYSICAL THERAPY FEMALE PELVIC EVALUATION   Patient Name: Yesenia Davis MRN: [id number] DOB:07-30-1975, 46 y.o., female Today's Date: 07/14/2021   PT End of Session - 07/14/21 1646     Visit Number 1    Number of Visits 12    Date for PT Re-Evaluation 10/06/21    Authorization Type IE: 07/14/21    PT Start Time 1650    PT Stop Time 1740    PT Time Calculation (min) 50 min    Activity Tolerance Patient tolerated treatment well             Past Medical History:  Diagnosis Date   Acne vulgaris    Epigastric pain    Gastritis    GERD (gastroesophageal reflux disease)    Gestational diabetes    Migraines    Past Surgical History:  Procedure Laterality Date   CHOLECYSTECTOMY  1996   cyst removed     from neck- b9   DILATION AND CURETTAGE OF UTERUS     TUBAL LIGATION Bilateral 08/21/2020   Procedure: POST PARTUM TUBAL LIGATION;  Surgeon: Linzie Collin, MD;  Location: [facility];  Service: Gynecology;  Laterality: Bilateral;   Patient Active Problem List   Diagnosis Date Noted   History of gestational diabetes 04/08/2021   Shingles outbreak 04/25/2020    PCP: Doreene Burke, CNM  REFERRING PROVIDER: Doreene Burke, CNM   REFERRING DIAG:  [phone] (ICD-10-CM) - Pelvic floor weakness   THERAPY DIAG:  Other lack of coordination  Sacrococcygeal disorders, not elsewhere classified  Muscle weakness (generalized)  Pelvic floor dysfunction  Rationale for Evaluation and Treatment: Rehabilitation  ONSET DATE: Over a year ago, but last 3 months worse  RED FLAGS: N/A  Have you had any night sweats? Unexplained weight loss? Saddle anesthesia? Unexplained changes in bowel or bladder habits?   SUBJECTIVE: Patient confirms identification and approves PT to assess pelvic floor and treatment Yes                                                                                                                                                                                            PRECAUTIONS: None  WEIGHT BEARING RESTRICTIONS: No  FALLS:  Has patient fallen in last 6 months? No  OCCUPATION/SOCIAL ACTIVITIES: Standing job, pressing socks   PLOF: Independent   CHIEF CONCERN: Pt has tried pelvic floor PT before for lower abdominal pain and some pressure felt at the vaginal opening, but not how Pt is feeling it today. Pt has been feeling "what I think is my cervix" when I'm standing for long periods of time or walking. It feels like  a tampon is hanging and is about to come out. Pt also feels "something hanging out" when she wipes but does not get in the way of urination. Pt has had to stop dancing because she feels like whatever is there is going to come out and down to the floor. Pt said the doctor that she went to did not tell her about a prolapse and when she examined her and asked to cough, the Dr did not say anything. Pt has noticed more bulging out of an organ in a squat position. During sexual relations, Pt occasionally has pain vaginally but it is described more as pressure days after penetrative sex. When the Pt sneezes or coughs, she does have urinary leakage. During her last menstrual cycle, Pt had a very sharp pain at the vagina and it took the breath out of her. She has not had a hx of very painful menstrual periods. Pt has taken Tylenol for the pain and it does aleve the pain when it arises. Pt has had a hx of buttock/tailbone pain, and Pt did have a fall when she was a little girl on the tailbone. Pt did not have any fractures, but she is unable to sit on hard surfaces without pain.     PAIN:  Are you having pain? Yes, tailbone Pain- current; 4/10, 9/10 (worst)  Aggravating factors: sitting on hard surfaces, sitting in wrong positions  Pressure at vaginal opening- 8/10 (worst), Aggravating factors: standing all day, household chores, caring children,      LIVING ENVIRONMENT: Lives with: lives with their family and lives with their  spouse Lives in: House/apartment   PATIENT GOALS: Pt would like to not feel the pressure at the end of the day, help with feeling in control of leakage     UROLOGICAL HISTORY Fluid intake: Yes: agua, soda every now and then    Pain with urination: No Fully empty bladder: No Stream: stop and go intermittent, strong  Urgency: Yes Frequency: 4x at work, 6x at home Nocturia: 3x Leakage: Coughing, Sneezing, Exercise, and Intercourse Pads: Yes Type: pantyliners to heavier flow  Amount: 2x/day but does not wear everyday  Bladder control (0-10): 3/10   GASTROINTESTINAL HISTORY  Pain with bowel movement: No Type of bowel movement:Type (Bristol Stool Scale) 4 Frequency: every other day  Fully empty rectum: No Leakage: No Pads: No   SEXUAL HISTORY/FUNCTION Pain with intercourse: No Ability to have vaginal penetration: Yes   OBSTETRICAL HISTORY Vaginal deliveries: G5P4 Tearing: Yes: with 2 children Currently pregnant: No  GYNECOLOGICAL HISTORY Hysterectomy: no, tubal ligation Pelvic Organ Prolapse: None, but has sxs  Heaviness/pressure: yes    OBJECTIVE:    COGNITION: Overall cognitive status: Within functional limits for tasks assessed     POSTURE:  Deferred 2/2 time constraints  Lumbar lordosis:   Thoracic kyphosis: Iliac crest height:  Lumbar lateral shift:  Pelvic obliquity:  Leg length discrepancy:   GAIT: Deferred 2/2 time constraints  Distance walked:  Comments:   Trendelenburg:   SENSATION: Deferred 2/2 time constraints  Light touch: , L2-S2 dermatomes  Proprioception:    RANGE OF MOTION:  Deferred 2/2 time constraints   (Norm range in degrees)  LEFT RIGHT  Lumbar forward flexion (65):      Lumbar extension (30):     Lumbar lateral flexion (25):     Thoracic and Lumbar rotation (30 degrees):       Hip Flexion (0-125):      Hip IR (0-45):  Hip ER (0-45):     Hip Adduction:      Hip Abduction (0-40):     Hip extension (0-15):     (*=  pain, Blank rows = not tested)   STRENGTH: MMT  Deferred 2/2 time constraints   RLE  LLE  Hip Flexion    Hip Extension    Hip Abduction     Hip Adduction     Hip ER     Hip IR     Knee Extension    Knee Flexion    Dorsiflexion     Plantarflexion (seated)    (*= pain, Blank rows = not tested)   SPECIAL TESTS: Deferred 2/2 time constraints  Centralization and Peripheralization (SN 92, -LR 0.12):  Slump (SN 83, -LR 0.32): R:  L:  SLR (SN 92, -LR 0.29): R:  L:   Lumbar quadrant (SN 70): R:  L:  FABER (SN 81): R:  L:  FADIR (SN 94): R:  L:  Hip scour (SN 50): R:  L:  Thigh Thrust (SN 88, -LR 0.18) : R:  L:  Distraction (UX32): R:  L:  Compression (SN/SP 69): R:  L:  Stork/March (SP 93): R:  L:     PHYSICAL PERFORMANCE MEASURES: Deferred 2/2 time constraints   STS:  RLE SLS:  LLE SLS:  6 MWT:  :  5TSTS:   PALPATION: Deferred 2/2 time constraints  Abdominal:  Diastasis:  finger above umbilicus,  fingers at and below umbilicus  Scar mobility: present/mobile perpendicular, parallel Rib flare: present/absent  EXTERNAL PELVIC EXAM: Patient educated on the purpose of the pelvic exam and articulated understanding; patient consented to the exam verbally. Deferred 2/2 time constraints  Palpation: Breath coordination: present/absent/inconsistent Voluntary Contraction: present/absent Relaxation: full/delayed/non-relaxing Perineal movement with sustained IAP increase ("bear down"): descent/no change/elevation/excessive descent Perineal movement with rapid IAP increase ("cough"): elevation/no change/descent Pubic symphysis: (0= no contraction, 1= flicker, 2= weak squeeze, 3= fair squeeze with lift, 4= good squeeze and lift against resistance, 5= strong squeeze against strong resistance)   INTERNAL PELVIC EXAM: Patient educated on the purpose of the pelvic exam and articulated understanding; patient consented to the exam verbally. Deferred 2/2 to time constraints Introitus  Appears:  Skin integrity:  Scar mobility: Strength (PERF):  Symmetry: Palpation: Prolapse: (0= no contraction, 1= flicker, 2= weak squeeze, 3= fair squeeze with lift, 4= good squeeze and lift against resistance, 5= strong squeeze against strong resistance)   TODAY'S TREATMENT:  Evaluation   Therapeutic Activity: Discussion and education on the anatomy of a prolapse, different kinds of prolapses, and how PFPT can assist with the symptoms of a prolapse and depending on the Grade, can also reduce it. Brief discussion on a physician doing a prolapse exam and giving a Grade as PFPT's are not allowed to diagnose a prolapse. Pt verbalized understanding. Education provided in both [place] and [place] as Pt speaks both languages but preferred to give history in Spanish and DPT wanting to reduce any  language barriers.    Patient Education:  Patient educated on what to expect during course of physical therapy, POC, and provided with HEP including: toileting posture and about pelvic organ support problems handout. Patient verbalized understanding and returned demonstration. Patient will benefit from further education in order to maximize compliance and understanding for long-term therapeutic gains.   Patient Surveys:  FOTO Bowel Constipation - 79  FOTO Urinary Problem - 52     ASSESSMENT:  Clinical Impression: Patient is a 46 y.o. who was  seen today for physical therapy evaluation and treatment for a chief concern of heaviness/pressure vaginally and urinary leakage. Today's evaluation suggest deficits in IAP management, PFM coordination, PFM strength, PFM endurance, posture and pain as evidenced by increased pressure (worst-8/10) at the vaginal opening at the end of the day, increased pressure felt with prolonged standing, squatting, social activities, caring for young children, tailbone worst pain 9/10 (NPRS scale) with a constant 4/10 pain in sitting, inability to sit for prolonged periods,  urinary leakage with sneezing/coughing/physical activity/penetrative sex, use of incontinence pads, nocturia (3x/night), urinary frequency (~12x/day), and feeling of incomplete bowel/bladder emptying. Patient's responses on FOTO Urinary Problem (52) and Bowel Constipation (52) indicates moderate limitation/disability/distress. Patient's progress may be limited due to time since onset; however, patient's motivation and previous participation in PFPT is advantageous. Pt with basic understanding of PFM function in posture, IAP management, bowel/bladder, and sexual function. Patient will benefit from skilled therapeutic intervention to address deficits in IAP management, PFM coordination, PFM strength, PFM endurance, posture and pain in order to increase PLOF and improve overall QOL.    Objective Impairments: decreased activity tolerance, decreased coordination, decreased endurance, decreased strength, improper body mechanics, postural dysfunction, and pain.   Activity Limitations: carrying, lifting, sitting, standing, transfers, continence, toileting, locomotion level, and caring for others  Personal Factors: Age, Past/current experiences, Time since onset of injury/illness/exacerbation, and 1-2 comorbidities: prediabetic and migraines  are also affecting patient's functional outcome.   Rehab Potential: Good  Clinical Decision Making: Evolving/moderate complexity  Evaluation Complexity: Moderate   GOALS: Goals reviewed with patient? Yes  SHORT TERM GOALS: Target date: 08/25/2021  Patient will decrease worst tailbone pain as reported on NPRS by at least 2 points to demonstrate clinically significant reduction in pain in order to restore/improve function and overall QOL. Baseline: 9/10 Goal status: INITIAL   LONG TERM GOALS: Target date: 10/06/2021   Patient will score  >/= 59 and 62 on FOTO Bowel Constipation/Urinary Problem in order to demonstrate improved PFM coordination, IAP management  and  overall improved QOL.  Baseline: both 52 Goal status: INITIAL  2.  Patient will report confidence in ability to control bladder > 6/10 in order to demonstrate improved function and ability to participate more fully in activities at home and in the community. Baseline: 3/10 Goal status: INITIAL  3.  Patient will report less than 5 incidents of stress urinary incontinence over the course of 3 weeks while coughing/sneezing/physical activity/sexual activity in order to demonstrate improved PFM coordination, strength, and function for improved overall QOL. Baseline: leakage occurs every time with increased IAP Goal status: INITIAL  4.  Patient will be able to articulate decreased pressure at the vaginal opening and demonstrate 3-5 postures that encourage gravity assisted repositioning of pelvic organs to decrease discomfort and pressure at end of day in order to participate more fully in activities at home and in the community.  Baseline: 8/10 pressure at the end of the day/feeling of "something" falling Goal status: INITIAL  5.  Patient will demonstrate circumferential and sequential contraction of >4/5 MMT, > 6 sec hold x10 and 5 consecutive quick flicks with </= 10 min rest between testing bouts, and relaxation of the PFM coordinated with breath for improved management of intra-abdominal pressure and normal bowel and bladder function without the presence of pain nor incontinence in order to improve participation at home and in the community. Baseline: will assess next visit Goal status: INITIAL    PLAN: PT Frequency: 1x/week  PT Duration: 12 weeks  Planned Interventions: Therapeutic exercises, Therapeutic activity, Neuromuscular re-education, Balance training, Gait training, Patient/Family education, Joint mobilization, Spinal mobilization, Cryotherapy, Moist heat, Taping, and Manual therapy  Plan For Next Session: phys assess, positional technique for prolapse   Milagros Middendorf, PT,  DPT  07/14/2021, 5:45 PM

## 2021-07-28 ENCOUNTER — Ambulatory Visit: Payer: BC Managed Care – PPO

## 2021-07-28 DIAGNOSIS — R278 Other lack of coordination: Secondary | ICD-10-CM | POA: Diagnosis not present

## 2021-07-28 DIAGNOSIS — M6289 Other specified disorders of muscle: Secondary | ICD-10-CM

## 2021-07-28 DIAGNOSIS — M533 Sacrococcygeal disorders, not elsewhere classified: Secondary | ICD-10-CM

## 2021-07-28 DIAGNOSIS — M6281 Muscle weakness (generalized): Secondary | ICD-10-CM

## 2021-07-28 NOTE — Therapy (Signed)
OUTPATIENT PHYSICAL THERAPY FEMALE PELVIC TREATMENT   Patient Name: Yesenia Davis MRN: [id number] DOB:12/11/1975, 46 y.o., female Today's Date: 07/28/2021   PT End of Session - 07/28/21 1619     Visit Number 2    Number of Visits 12    Date for PT Re-Evaluation 10/06/21    PT Start Time 1618    PT Stop Time 1705    PT Time Calculation (min) 47 min    Activity Tolerance Patient tolerated treatment well             Past Medical History:  Diagnosis Date   Acne vulgaris    Epigastric pain    Gastritis    GERD (gastroesophageal reflux disease)    Gestational diabetes    Migraines    Past Surgical History:  Procedure Laterality Date   CHOLECYSTECTOMY  1996   cyst removed     from neck- b9   DILATION AND CURETTAGE OF UTERUS     TUBAL LIGATION Bilateral 08/21/2020   Procedure: POST PARTUM TUBAL LIGATION;  Surgeon: Linzie Collin, MD;  Location: [facility];  Service: Gynecology;  Laterality: Bilateral;   Patient Active Problem List   Diagnosis Date Noted   History of gestational diabetes 04/08/2021   Shingles outbreak 04/25/2020    PCP: Doreene Burke, CNM  REFERRING PROVIDER: Doreene Burke, CNM   REFERRING DIAG:  [phone] (ICD-10-CM) - Pelvic floor weakness   THERAPY DIAG:  Other lack of coordination  Sacrococcygeal disorders, not elsewhere classified  Muscle weakness (generalized)  Pelvic floor dysfunction  Rationale for Evaluation and Treatment: Rehabilitation  ONSET DATE: Over a year ago, but last 3 months worse                                                                                                                                                                                         PRECAUTIONS: None  WEIGHT BEARING RESTRICTIONS: No  FALLS:  Has patient fallen in last 6 months? No  OCCUPATION/SOCIAL ACTIVITIES: Standing job, pressing socks   PLOF: Independent   CHIEF CONCERN: Pt has tried pelvic floor PT before for lower  abdominal pain and some pressure felt at the vaginal opening, but not how Pt is feeling it today. Pt has been feeling "what I think is my cervix" when I'm standing for long periods of time or walking. It feels like a tampon is hanging and is about to come out. Pt also feels "something hanging out" when she wipes but does not get in the way of urination. Pt has had to stop dancing because she feels like whatever is there is going to come out and down  to the floor. Pt said the doctor that she went to did not tell her about a prolapse and when she examined her and asked to cough, the Dr did not say anything. Pt has noticed more bulging out of an organ in a squat position. During sexual relations, Pt occasionally has pain vaginally but it is described more as pressure days after penetrative sex. When the Pt sneezes or coughs, she does have urinary leakage. During her last menstrual cycle, Pt had a very sharp pain at the vagina and it took the breath out of her. She has not had a hx of very painful menstrual periods. Pt has taken Tylenol for the pain and it does aleve the pain when it arises. Pt has had a hx of buttock/tailbone pain, and Pt did have a fall when she was a little girl on the tailbone. Pt did not have any fractures, but she is unable to sit on hard surfaces without pain.     LIVING ENVIRONMENT: Lives with: lives with their family and lives with their spouse Lives in: House/apartment   PATIENT GOALS: Pt would like to not feel the pressure at the end of the day, help with feeling in control of leakage     UROLOGICAL HISTORY Fluid intake: Yes: agua, soda every now and then    Pain with urination: No Fully empty bladder: No Stream: stop and go intermittent, strong  Urgency: Yes Frequency: 4x at work, 6x at home Nocturia: 3x Leakage: Coughing, Sneezing, Exercise, and Intercourse Pads: Yes Type: pantyliners to heavier flow  Amount: 2x/day but does not wear everyday  Bladder control  (0-10): 3/10   GASTROINTESTINAL HISTORY  Pain with bowel movement: No Type of bowel movement:Type (Bristol Stool Scale) 4 Frequency: every other day  Fully empty rectum: No Leakage: No Pads: No   SEXUAL HISTORY/FUNCTION Pain with intercourse: No Ability to have vaginal penetration: Yes   OBSTETRICAL HISTORY Vaginal deliveries: G5P4 Tearing: Yes: with 2 children Currently pregnant: No  GYNECOLOGICAL HISTORY Hysterectomy: no, tubal ligation Pelvic Organ Prolapse: None, but has sxs  Heaviness/pressure: yes   SUBJECTIVE:  Pt has not noticed any significant changes. Pt reports having episodes during bathing when she feels "something" at the vaginal opening and when she goes to push it back in, it is painful. Pt also mentioned having flatulence incontinence and she doesn't know why.    PAIN:  Are you having pain? No    TODAY'S TREATMENT:   Pre-treatment assessment:  OBJECTIVE: (07/28/21)   COGNITION: Overall cognitive status: Within functional limits for tasks assessed     POSTURE:  Iliac crest height: WNL Pelvic obliquity: WNL     RANGE OF MOTION:  Deferred 2/2 time constraints   (Norm range in degrees)  LEFT RIGHT  Lumbar forward flexion (65):  WNL    Lumbar extension (30): WNL    Lumbar lateral flexion (25):  WNL WNL  Thoracic and Lumbar rotation (30 degrees):    WNL WNL  Hip Flexion (0-125):   WNL WNL  Hip IR (0-45):  WNL WNL  Hip ER (0-45):  WNL WNL  Hip Adduction:   WNL WNL  Hip Abduction (0-40):  WNL WNL  Hip extension (0-15):  WNL WNL  (*= pain, Blank rows = not tested)   STRENGTH: MMT    RLE  LLE  Hip Flexion 4 4  Hip Extension 4 4  Hip Abduction  5 5  Hip Adduction     Hip ER  5 5  Hip IR  5 5  Knee Extension 5 5  Knee Flexion 4 4  Dorsiflexion     Plantarflexion (seated) 5 5  (*= pain, Blank rows = not tested)   SPECIAL TESTS:   FABER (SN 81): negative B FADIR (SN 94): negative B   PALPATION:  Abdominal:  Diastasis: none    Rib flare: present B RLQ: tenderness upon palpation more than the LLQ   L coccyx bony prominence - tender upon palpation - surrounding musculature (gluteals/PFM) R coccyx bony prominence - tender upon palpation   L4-S1 - hypomobile  SIJ bilaterally - hypomobile with increased pain   Coccyx can move into slight extension when asked to sustain IAP increase "bear down" with no pain    EXTERNAL PELVIC EXAM: Patient educated on the purpose of the pelvic exam and articulated understanding; patient consented to the exam verbally. Deferred 2/2 time constraints  Breath coordination: present but inconsistent  Voluntary Contraction: present, 1/5 MMT Relaxation: delayed  Perineal movement with sustained IAP increase ("bear down"): descent Perineal movement with rapid IAP increase ("cough"): no change  Pubic symphysis: no tenderness  (0= no contraction, 1= flicker, 2= weak squeeze, 3= fair squeeze with lift, 4= good squeeze and lift against resistance, 5= strong squeeze against strong resistance)   Neuromuscular Re-education: Supine hooklying diaphragmatic breathing with VCs and TCs for downregulation of the nervous system and improved management of IAP Discussion on pursed-lip breathing vs. mouth-breathing Shown anatomy and relationship between PFM and diaphragm   Supine pelvic tilts for improved spinal mobility and pain modulation at the coccyx   Seated pelvic tilts for improved spinal mobility and postural awareness   Discussion on postural awareness and shown towel roll technique for when she will be sitting for longer periods of time at work. In addition, Pt given information on the donut pillow as Pt mentioned seeing them online before.   Patient response to interventions: Pt began to have pain at the tailbone after lying down in one position during assessment/interventions but relieved once sitting    Patient Education:  Patient provided with HEP: seated/supine pelvic tilts, supine  diaphragmatic breathing. Patient verbalized understanding and returned demonstration. Patient educated throughout session on appropriate technique and form using multi-modal cueing, HEP, and activity modification. Patient will continue to benefit from further education in order to maximize compliance and understanding for long-term therapeutic gains.    ASSESSMENT:  Clinical Impression: Patient presents to clinic with excellent motivation to participate in today's session. Upon physical assessment, Pt demonstrates deficits in IAP management, PFM coordination, PFM strength, PFM endurance, LE strength posture and pain as evidenced by increased B rounded shoulders and posterior pelvic tilt in sitting, B rib flare, 4/5 MMT in hip flexion/ext and knee flexion B, tenderness upon palpation at RLQ>LLQ, coccyx bony prominences B tender upon palpation, hypomobility of L4-S1, hypomobility of SIJ B and tenderness upon palpation, present but inconsistent breath coordination, delayed PFM relaxation, 1/5 MMT PFM, and noted Valsalva maneuver throughout assessment. Pt required significant VCs and TCs for pelvic tilts and diaphragmatic breathing. Pt educated on posture in relation with visuals of spinal anatomy specifically the sacrum/coccyx. Pt verbalized understanding but will continue to benefit from continued education. Patient will continue to benefit from skilled therapeutic intervention to address deficits in IAP management, PFM coordination, PFM strength, PFM endurance, LE strength, posture and pain in order to increase PLOF and improve overall QOL.    Objective Impairments: decreased activity tolerance, decreased coordination, decreased endurance, decreased strength, improper body mechanics,  postural dysfunction, and pain.   Activity Limitations: carrying, lifting, sitting, standing, transfers, continence, toileting, locomotion level, and caring for others  Personal Factors: Age, Past/current experiences, Time  since onset of injury/illness/exacerbation, and 1-2 comorbidities: prediabetic and migraines  are also affecting patient's functional outcome.   Rehab Potential: Good  Clinical Decision Making: Evolving/moderate complexity  Evaluation Complexity: Moderate   GOALS: Goals reviewed with patient? Yes  SHORT TERM GOALS: Target date: 09/08/2021  Patient will decrease worst tailbone pain as reported on NPRS by at least 2 points to demonstrate clinically significant reduction in pain in order to restore/improve function and overall QOL. Baseline: 9/10 Goal status: INITIAL   LONG TERM GOALS: Target date: 10/20/2021   Patient will score  >/= 59 and 62 on FOTO Bowel Constipation/Urinary Problem in order to demonstrate improved PFM coordination, IAP management  and overall improved QOL.  Baseline: both 52 Goal status: INITIAL  2.  Patient will report confidence in ability to control bladder > 6/10 in order to demonstrate improved function and ability to participate more fully in activities at home and in the community. Baseline: 3/10 Goal status: INITIAL  3.  Patient will report less than 5 incidents of stress urinary incontinence over the course of 3 weeks while coughing/sneezing/physical activity/sexual activity in order to demonstrate improved PFM coordination, strength, and function for improved overall QOL. Baseline: leakage occurs every time with increased IAP Goal status: INITIAL  4.  Patient will be able to articulate decreased pressure at the vaginal opening and demonstrate 3-5 postures that encourage gravity assisted repositioning of pelvic organs to decrease discomfort and pressure at end of day in order to participate more fully in activities at home and in the community.  Baseline: 8/10 pressure at the end of the day/feeling of "something" falling Goal status: INITIAL  5.  Patient will demonstrate circumferential and sequential contraction of >4/5 MMT, > 6 sec hold x10 and 5  consecutive quick flicks with </= 10 min rest between testing bouts, and relaxation of the PFM coordinated with breath for improved management of intra-abdominal pressure and normal bowel and bladder function without the presence of pain nor incontinence in order to improve participation at home and in the community. Baseline: will assess next visit, 1/5 MMT/no endurance tested (07/28/21) Goal status: INITIAL    PLAN: PT Frequency: 1x/week  PT Duration: 12 weeks  Planned Interventions: Therapeutic exercises, Therapeutic activity, Neuromuscular re-education, Balance training, Gait training, Patient/Family education, Joint mobilization, Spinal mobilization, Cryotherapy, Moist heat, Taping, and Manual therapy  Plan For Next Session: myofascial at abdomen, manual at SIJ/coccyx with cat/cow/childs pose, begin deep core?   Leveon Pelzer, PT, DPT  07/28/2021, 5:26 PM

## 2021-08-04 ENCOUNTER — Ambulatory Visit: Payer: BC Managed Care – PPO

## 2021-08-04 DIAGNOSIS — M533 Sacrococcygeal disorders, not elsewhere classified: Secondary | ICD-10-CM

## 2021-08-04 DIAGNOSIS — M6281 Muscle weakness (generalized): Secondary | ICD-10-CM

## 2021-08-04 DIAGNOSIS — M6289 Other specified disorders of muscle: Secondary | ICD-10-CM

## 2021-08-04 DIAGNOSIS — R278 Other lack of coordination: Secondary | ICD-10-CM

## 2021-08-04 NOTE — Therapy (Signed)
OUTPATIENT PHYSICAL THERAPY FEMALE PELVIC TREATMENT   Patient Name: Yesenia Davis MRN: [id number] DOB:[birth year], 46 y.o., female Today's Date: 08/04/2021   PT End of Session - 08/04/21 1616     Visit Number 3    Number of Visits 12    Date for PT Re-Evaluation 10/06/21    PT Start Time [id number]    PT Stop Time 1655    PT Time Calculation (min) 40 min    Activity Tolerance Patient tolerated treatment well             Past Medical History:  Diagnosis Date   Acne vulgaris    Epigastric pain    Gastritis    GERD (gastroesophageal reflux disease)    Gestational diabetes    Migraines    Past Surgical History:  Procedure Laterality Date   CHOLECYSTECTOMY  1996   cyst removed     from neck- b9   DILATION AND CURETTAGE OF UTERUS     TUBAL LIGATION Bilateral 08/21/2020   Procedure: POST PARTUM TUBAL LIGATION;  Surgeon: Harlin Heys, MD;  Location: [facility];  Service: Gynecology;  Laterality: Bilateral;   Patient Active Problem List   Diagnosis Date Noted   History of gestational diabetes 04/08/2021   Shingles outbreak 04/25/2020    PCP: Philip Aspen, CNM  REFERRING PROVIDER: Philip Aspen, CNM   REFERRING DIAG:  [phone] (ICD-10-CM) - Pelvic floor weakness   THERAPY DIAG:  Other lack of coordination  Sacrococcygeal disorders, not elsewhere classified  Muscle weakness (generalized)  Pelvic floor dysfunction  Rationale for Evaluation and Treatment: Rehabilitation  ONSET DATE: Over a year ago, but last 3 months worse                                                                                                                                                                                         PRECAUTIONS: None  WEIGHT BEARING RESTRICTIONS: No  FALLS:  Has patient fallen in last 6 months? No  OCCUPATION/SOCIAL ACTIVITIES: Standing job, pressing socks   PLOF: Independent   CHIEF CONCERN: Pt has tried pelvic floor PT before for lower  abdominal pain and some pressure felt at the vaginal opening, but not how Pt is feeling it today. Pt has been feeling "what I think is my cervix" when I'm standing for long periods of time or walking. It feels like a tampon is hanging and is about to come out. Pt also feels "something hanging out" when she wipes but does not get in the way of urination. Pt has had to stop dancing because she feels like whatever is there is going to come out and down  to the floor. Pt said the doctor that she went to did not tell her about a prolapse and when she examined her and asked to cough, the Dr did not say anything. Pt has noticed more bulging out of an organ in a squat position. During sexual relations, Pt occasionally has pain vaginally but it is described more as pressure days after penetrative sex. When the Pt sneezes or coughs, she does have urinary leakage. During her last menstrual cycle, Pt had a very sharp pain at the vagina and it took the breath out of her. She has not had a hx of very painful menstrual periods. Pt has taken Tylenol for the pain and it does aleve the pain when it arises. Pt has had a hx of buttock/tailbone pain, and Pt did have a fall when she was a little girl on the tailbone. Pt did not have any fractures, but she is unable to sit on hard surfaces without pain.     LIVING ENVIRONMENT: Lives with: lives with their family and lives with their spouse Lives in: House/apartment   PATIENT GOALS: Pt would like to not feel the pressure at the end of the day, help with feeling in control of leakage     UROLOGICAL HISTORY Fluid intake: Yes: agua, soda every now and then    Pain with urination: No Fully empty bladder: No Stream: stop and go intermittent, strong  Urgency: Yes Frequency: 4x at work, 6x at home Nocturia: 3x Leakage: Coughing, Sneezing, Exercise, and Intercourse Pads: Yes Type: pantyliners to heavier flow  Amount: 2x/day but does not wear everyday  Bladder control  (0-10): 3/10   GASTROINTESTINAL HISTORY  Pain with bowel movement: No Type of bowel movement:Type (Bristol Stool Scale) 4 Frequency: every other day  Fully empty rectum: No Leakage: No Pads: No   SEXUAL HISTORY/FUNCTION Pain with intercourse: No Ability to have vaginal penetration: Yes   OBSTETRICAL HISTORY Vaginal deliveries: G5P4 Tearing: Yes: with 2 children Currently pregnant: No  GYNECOLOGICAL HISTORY Hysterectomy: no, tubal ligation Pelvic Organ Prolapse: None, but has sxs  Heaviness/pressure: yes   SUBJECTIVE:  Pt noticed when she drives longer distances she begins to have significant tailbone pain. Pt hasn't been able to go buy the donut pillow she is interested in. DPT reminded Pt about towel roll technique to use and Pt said she will try this week. Pt has not noticed a lot of the bulging tissue at the vaginal opening.    PAIN:  Are you having pain? Yes, low back pain, 4/10     OBJECTIVE: (07/28/21)   COGNITION: Overall cognitive status: Within functional limits for tasks assessed     POSTURE:  Iliac crest height: WNL Pelvic obliquity: WNL     RANGE OF MOTION:  (Norm range in degrees)  LEFT RIGHT  Lumbar forward flexion (65):  WNL    Lumbar extension (30): WNL    Lumbar lateral flexion (25):  WNL WNL  Thoracic and Lumbar rotation (30 degrees):    WNL WNL  Hip Flexion (0-125):   WNL WNL  Hip IR (0-45):  WNL WNL  Hip ER (0-45):  WNL WNL  Hip Adduction:   WNL WNL  Hip Abduction (0-40):  WNL WNL  Hip extension (0-15):  WNL WNL  (*= pain, Blank rows = not tested)   STRENGTH: MMT    RLE  LLE  Hip Flexion 4 4  Hip Extension 4 4  Hip Abduction  5 5  Hip Adduction  Hip ER  5 5  Hip IR  5 5  Knee Extension 5 5  Knee Flexion 4 4  Dorsiflexion     Plantarflexion (seated) 5 5  (*= pain, Blank rows = not tested)   SPECIAL TESTS:   FABER (SN 81): negative B FADIR (SN 94): negative B   PALPATION:  Abdominal:  Diastasis: none   Rib  flare: present B RLQ: tenderness upon palpation more than the LLQ   L coccyx bony prominence - tender upon palpation - surrounding musculature (gluteals/PFM) R coccyx bony prominence - tender upon palpation   L4-S1 - hypomobile  SIJ bilaterally - hypomobile with increased pain   Coccyx can move into slight extension when asked to sustain IAP increase "bear down" with no pain    EXTERNAL PELVIC EXAM: Patient educated on the purpose of the pelvic exam and articulated understanding; patient consented to the exam verbally. Deferred 2/2 time constraints  Breath coordination: present but inconsistent  Voluntary Contraction: present, 1/5 MMT Relaxation: delayed  Perineal movement with sustained IAP increase ("bear down"): descent Perineal movement with rapid IAP increase ("cough"): no change  Pubic symphysis: no tenderness  (0= no contraction, 1= flicker, 2= weak squeeze, 3= fair squeeze with lift, 4= good squeeze and lift against resistance, 5= strong squeeze against strong resistance)    TODAY'S TREATMENT:  Neuromuscular Re-education: Quadruped cat/cow for improved spinal mobility and pain modulation technique for tailbone   Childs pose for improved spinal mobility and pain modulation technique for tailbone  Positional variation for suspected prolapse to decrease heaviness/pressure felt at the end of the day  Child's pose with pillow behind bottom for feedback Bridge with pillows underneath   Supine hooklying diaphragmatic breathing with VCs and TCs for downregulation of the nervous system and improved management of IAP  [facility] abdominal rehab   Supine hooklying TrA contraction with coordinated exhale    Patient response to interventions: Pt ended session with 5/10 pain at B SIJ    Patient Education:  Patient provided with HEP: positional variation to decrease heaviness, supine TrA activation, childs pose, cat/cow. Patient educated throughout session on appropriate technique  and form using multi-modal cueing, HEP, and activity modification. Patient will continue to benefit from further education in order to maximize compliance and understanding for long-term therapeutic gains.    ASSESSMENT:  Clinical Impression: Patient presents to clinic with excellent motivation to participate in today's session. Pt continues to demonstrate deficits in IAP management, PFM coordination, PFM strength, PFM endurance, LE strength, posture and pain. Pt arrived to clinic with 4/10 LBP. Pt not having current pain at the tailbone and reported the pelvic tilts helped remind her of her posture this past week. Pt required moderate VCs and TCs for proper TrA activation and diaphragmatic breathing without bodily compensations. Pt observed to forcefully pull in abdominals during TrA contraction but with increased time and cueing, Pt demonstrated improvement. Discussion with Pt on the importance of diaphragmatic breathing and it's correlation with the PFM and IAP management. Pt ended session with 5/10 pain at B SIJ. Pt responded well to all active and educational interventions. Patient will continue to benefit from skilled therapeutic intervention to address deficits in IAP management, PFM coordination, PFM strength, PFM endurance, LE strength, posture and pain in order to increase PLOF and improve overall QOL.    Objective Impairments: decreased activity tolerance, decreased coordination, decreased endurance, decreased strength, improper body mechanics, postural dysfunction, and pain.   Activity Limitations: carrying, lifting, sitting, standing, transfers, continence, toileting,  locomotion level, and caring for others  Personal Factors: Age, Past/current experiences, Time since onset of injury/illness/exacerbation, and 1-2 comorbidities: prediabetic and migraines  are also affecting patient's functional outcome.   Rehab Potential: Good  Clinical Decision Making: Evolving/moderate  complexity  Evaluation Complexity: Moderate   GOALS: Goals reviewed with patient? Yes  SHORT TERM GOALS: Target date: 09/15/2021  Patient will decrease worst tailbone pain as reported on NPRS by at least 2 points to demonstrate clinically significant reduction in pain in order to restore/improve function and overall QOL. Baseline: 9/10 Goal status: INITIAL   LONG TERM GOALS: Target date: 10/27/2021   Patient will score  >/= 59 and 62 on FOTO Bowel Constipation/Urinary Problem in order to demonstrate improved PFM coordination, IAP management  and overall improved QOL.  Baseline: both 52 Goal status: INITIAL  2.  Patient will report confidence in ability to control bladder > 6/10 in order to demonstrate improved function and ability to participate more fully in activities at home and in the community. Baseline: 3/10 Goal status: INITIAL  3.  Patient will report less than 5 incidents of stress urinary incontinence over the course of 3 weeks while coughing/sneezing/physical activity/sexual activity in order to demonstrate improved PFM coordination, strength, and function for improved overall QOL. Baseline: leakage occurs every time with increased IAP Goal status: INITIAL  4.  Patient will be able to articulate decreased pressure at the vaginal opening and demonstrate 3-5 postures that encourage gravity assisted repositioning of pelvic organs to decrease discomfort and pressure at end of day in order to participate more fully in activities at home and in the community.  Baseline: 8/10 pressure at the end of the day/feeling of "something" falling Goal status: INITIAL  5.  Patient will demonstrate circumferential and sequential contraction of >4/5 MMT, > 6 sec hold x10 and 5 consecutive quick flicks with </= 10 min rest between testing bouts, and relaxation of the PFM coordinated with breath for improved management of intra-abdominal pressure and normal bowel and bladder function without the  presence of pain nor incontinence in order to improve participation at home and in the community. Baseline: will assess next visit, 1/5 MMT/no endurance tested (07/28/21) Goal status: INITIAL    PLAN: PT Frequency: 1x/week  PT Duration: 12 weeks  Planned Interventions: Therapeutic exercises, Therapeutic activity, Neuromuscular re-education, Balance training, Gait training, Patient/Family education, Joint mobilization, Spinal mobilization, Cryotherapy, Moist heat, Taping, and Manual therapy  Plan For Next Session: manual at SIJ/mobs or STM, deep core continue   [facility], PT, DPT  08/04/2021, 4:17 PM

## 2021-08-11 ENCOUNTER — Ambulatory Visit: Payer: BC Managed Care – PPO

## 2021-08-18 ENCOUNTER — Ambulatory Visit: Payer: BC Managed Care – PPO | Attending: Certified Nurse Midwife

## 2021-08-18 DIAGNOSIS — R278 Other lack of coordination: Secondary | ICD-10-CM | POA: Diagnosis present

## 2021-08-18 DIAGNOSIS — M6289 Other specified disorders of muscle: Secondary | ICD-10-CM | POA: Insufficient documentation

## 2021-08-18 DIAGNOSIS — M533 Sacrococcygeal disorders, not elsewhere classified: Secondary | ICD-10-CM | POA: Diagnosis present

## 2021-08-18 DIAGNOSIS — M6281 Muscle weakness (generalized): Secondary | ICD-10-CM | POA: Diagnosis present

## 2021-08-18 NOTE — Therapy (Addendum)
OUTPATIENT PHYSICAL THERAPY FEMALE PELVIC TREATMENT   Patient Name: Yesenia Davis MRN: [id number] DOB:14-Jun-1975, 46 y.o., female Today's Date: 08/18/2021   PT End of Session - 08/18/21 1604     Visit Number 4    Number of Visits 12    Date for PT Re-Evaluation 10/06/21    PT Start Time 1605    PT Stop Time 1645    PT Time Calculation (min) 40 min    Activity Tolerance Patient tolerated treatment well             Past Medical History:  Diagnosis Date   Acne vulgaris    Epigastric pain    Gastritis    GERD (gastroesophageal reflux disease)    Gestational diabetes    Migraines    Past Surgical History:  Procedure Laterality Date   CHOLECYSTECTOMY  1996   cyst removed     from neck- b9   DILATION AND CURETTAGE OF UTERUS     TUBAL LIGATION Bilateral 08/21/2020   Procedure: POST PARTUM TUBAL LIGATION;  Surgeon: Linzie Collin, MD;  Location: [facility];  Service: Gynecology;  Laterality: Bilateral;   Patient Active Problem List   Diagnosis Date Noted   History of gestational diabetes 04/08/2021   Shingles outbreak 04/25/2020    PCP: Doreene Burke, CNM  REFERRING PROVIDER: Doreene Burke, CNM   REFERRING DIAG:  [phone] (ICD-10-CM) - Pelvic floor weakness   THERAPY DIAG:  Other lack of coordination  Sacrococcygeal disorders, not elsewhere classified  Muscle weakness (generalized)  Pelvic floor dysfunction  Rationale for Evaluation and Treatment: Rehabilitation  ONSET DATE: Over a year ago, but last 3 months worse                                                                                                                                                                                         PRECAUTIONS: None  WEIGHT BEARING RESTRICTIONS: No  FALLS:  Has patient fallen in last 6 months? No  OCCUPATION/SOCIAL ACTIVITIES: Standing job, pressing socks   PLOF: Independent   CHIEF CONCERN: Pt has tried pelvic floor PT before for lower abdominal  pain and some pressure felt at the vaginal opening, but not how Pt is feeling it today. Pt has been feeling "what I think is my cervix" when I'm standing for long periods of time or walking. It feels like a tampon is hanging and is about to come out. Pt also feels "something hanging out" when she wipes but does not get in the way of urination. Pt has had to stop dancing because she feels like whatever is there is going to come out and down  to the floor. Pt said the doctor that she went to did not tell her about a prolapse and when she examined her and asked to cough, the Dr did not say anything. Pt has noticed more bulging out of an organ in a squat position. During sexual relations, Pt occasionally has pain vaginally but it is described more as pressure days after penetrative sex. When the Pt sneezes or coughs, she does have urinary leakage. During her last menstrual cycle, Pt had a very sharp pain at the vagina and it took the breath out of her. She has not had a hx of very painful menstrual periods. Pt has taken Tylenol for the pain and it does aleve the pain when it arises. Pt has had a hx of buttock/tailbone pain, and Pt did have a fall when she was a little girl on the tailbone. Pt did not have any fractures, but she is unable to sit on hard surfaces without pain.     LIVING ENVIRONMENT: Lives with: lives with their family and lives with their spouse Lives in: House/apartment   PATIENT GOALS: Pt would like to not feel the pressure at the end of the day, help with feeling in control of leakage     UROLOGICAL HISTORY Fluid intake: Yes: agua, soda every now and then    Pain with urination: No Fully empty bladder: No Stream: stop and go intermittent, strong  Urgency: Yes Frequency: 4x at work, 6x at home Nocturia: 3x Leakage: Coughing, Sneezing, Exercise, and Intercourse Pads: Yes Type: pantyliners to heavier flow  Amount: 2x/day but does not wear everyday  Bladder control (0-10): 3/10    GASTROINTESTINAL HISTORY  Pain with bowel movement: No Type of bowel movement:Type (Bristol Stool Scale) 4 Frequency: every other day  Fully empty rectum: No Leakage: No Pads: No   SEXUAL HISTORY/FUNCTION Pain with intercourse: No Ability to have vaginal penetration: Yes   OBSTETRICAL HISTORY Vaginal deliveries: G5P4 Tearing: Yes: with 2 children Currently pregnant: No  GYNECOLOGICAL HISTORY Hysterectomy: no, tubal ligation Pelvic Organ Prolapse: None, but has sxs  Heaviness/pressure: yes   SUBJECTIVE:  Pt is feeling better after having COVID. Pt has been having increased pressure at the vaginal opening but she noticed with intercourse the tissue went back inside.    PAIN:  Are you having pain?   NPRS scale: 6/10      OBJECTIVE: (07/28/21)   COGNITION: Overall cognitive status: Within functional limits for tasks assessed     POSTURE:  Iliac crest height: WNL Pelvic obliquity: WNL     RANGE OF MOTION:  (Norm range in degrees)  LEFT RIGHT  Lumbar forward flexion (65):  WNL    Lumbar extension (30): WNL    Lumbar lateral flexion (25):  WNL WNL  Thoracic and Lumbar rotation (30 degrees):    WNL WNL  Hip Flexion (0-125):   WNL WNL  Hip IR (0-45):  WNL WNL  Hip ER (0-45):  WNL WNL  Hip Adduction:   WNL WNL  Hip Abduction (0-40):  WNL WNL  Hip extension (0-15):  WNL WNL  (*= pain, Blank rows = not tested)   STRENGTH: MMT    RLE  LLE  Hip Flexion 4 4  Hip Extension 4 4  Hip Abduction  5 5  Hip Adduction     Hip ER  5 5  Hip IR  5 5  Knee Extension 5 5  Knee Flexion 4 4  Dorsiflexion     Plantarflexion (seated) 5  5  (*= pain, Blank rows = not tested)   SPECIAL TESTS:   FABER (SN 81): negative B FADIR (SN 94): negative B   PALPATION:  Abdominal:  Diastasis: none   Rib flare: present B RLQ: tenderness upon palpation more than the LLQ   L coccyx bony prominence - tender upon palpation - surrounding musculature (gluteals/PFM) R coccyx  bony prominence - tender upon palpation   L4-S1 - hypomobile  SIJ bilaterally - hypomobile with increased pain   Coccyx can move into slight extension when asked to sustain IAP increase "bear down" with no pain    EXTERNAL PELVIC EXAM: Patient educated on the purpose of the pelvic exam and articulated understanding; patient consented to the exam verbally. Deferred 2/2 time constraints  Breath coordination: present but inconsistent  Voluntary Contraction: present, 1/5 MMT Relaxation: delayed  Perineal movement with sustained IAP increase ("bear down"): descent Perineal movement with rapid IAP increase ("cough"): no change  Pubic symphysis: no tenderness  (0= no contraction, 1= flicker, 2= weak squeeze, 3= fair squeeze with lift, 4= good squeeze and lift against resistance, 5= strong squeeze against strong resistance)    TODAY'S TREATMENT:  Manual Therapy: Prone STM B paraspinals for improved muscular tension  Significant tension noted  Prone STM at B gluteals/deep hip ERs with movement (hip IR) for improved mobility and muscular tension  Significant tension noted especially at superior SIJ  Prone STM along SIJ ligamentous structures for improved mobility and muscular tension  Unable to tolerate Grade I spinal mobs at L4-SI or along SIJ  Neuromuscular Re-education: Pain modulation techniques Single knee to chest, x10 B Lateral trunk rotation, x10 B Seated piriformis pose with breathing hold for 15 secs   Supine hooklying diaphragmatic breathing with VCs and TCs for downregulation of the nervous system and improved management of IAP  Review of [facility] abdominal rehab   Supine hooklying TrA contraction with coordinated exhale, VCs and TCs to decrease bodily compensations   Patient response to interventions: Pt ended session with 4/10 pain at R lower back   Patient Education:  Patient provided with HEP: pain modulation techniques above and prone IR ROM with coordinated  breath. Patient educated throughout session on appropriate technique and form using multi-modal cueing, HEP, and activity modification. Patient will continue to benefit from further education in order to maximize compliance and understanding for long-term therapeutic gains.    ASSESSMENT:  Clinical Impression: Patient presents to clinic with excellent motivation to participate in today's session. Pt continues to demonstrate deficits in IAP management, PFM coordination, PFM strength, PFM endurance, LE strength, posture and pain. Pt arrived to clinic with 6/10 at upper gluteals. Pt did travel this weekend and finds herself going into a posterior pelvic tilt throughout the ride. Pt educated on posture in sitting and pelvic tilts to aid in pain modulation. Pt with significant tension upon palpation of B paraspinals, gluteal musculature and ligamentous structures surrounding B SIJ. Pt required moderate VCs and TCs for proper TrA activation and to decrease bodily compensation (holding exhalation and forceful activation). With increased time Pt demonstrated understanding. Pt also becoming more aware of Valsalva maneuver throughout the session. Pt ended session with 4/10 pain at R upper gluteals. Pt responded well to manual, active, and educational interventions. Patient will continue to benefit from skilled therapeutic intervention to address deficits in IAP management, PFM coordination, PFM strength, PFM endurance, LE strength, posture and pain in order to increase PLOF and improve overall QOL.    Objective Impairments: decreased  activity tolerance, decreased coordination, decreased endurance, decreased strength, improper body mechanics, postural dysfunction, and pain.   Activity Limitations: carrying, lifting, sitting, standing, transfers, continence, toileting, locomotion level, and caring for others  Personal Factors: Age, Past/current experiences, Time since onset of injury/illness/exacerbation, and 1-2  comorbidities: prediabetic and migraines  are also affecting patient's functional outcome.   Rehab Potential: Good  Clinical Decision Making: Evolving/moderate complexity  Evaluation Complexity: Moderate   GOALS: Goals reviewed with patient? Yes  SHORT TERM GOALS: Target date: 09/29/2021  Patient will decrease worst tailbone pain as reported on NPRS by at least 2 points to demonstrate clinically significant reduction in pain in order to restore/improve function and overall QOL. Baseline: 9/10 Goal status: INITIAL   LONG TERM GOALS: Target date: 11/10/2021   Patient will score  >/= 59 and 62 on FOTO Bowel Constipation/Urinary Problem in order to demonstrate improved PFM coordination, IAP management  and overall improved QOL.  Baseline: both 52 Goal status: INITIAL  2.  Patient will report confidence in ability to control bladder > 6/10 in order to demonstrate improved function and ability to participate more fully in activities at home and in the community. Baseline: 3/10 Goal status: INITIAL  3.  Patient will report less than 5 incidents of stress urinary incontinence over the course of 3 weeks while coughing/sneezing/physical activity/sexual activity in order to demonstrate improved PFM coordination, strength, and function for improved overall QOL. Baseline: leakage occurs every time with increased IAP Goal status: INITIAL  4.  Patient will be able to articulate decreased pressure at the vaginal opening and demonstrate 3-5 postures that encourage gravity assisted repositioning of pelvic organs to decrease discomfort and pressure at end of day in order to participate more fully in activities at home and in the community.  Baseline: 8/10 pressure at the end of the day/feeling of "something" falling Goal status: INITIAL  5.  Patient will demonstrate circumferential and sequential contraction of >4/5 MMT, > 6 sec hold x10 and 5 consecutive quick flicks with </= 10 min rest between  testing bouts, and relaxation of the PFM coordinated with breath for improved management of intra-abdominal pressure and normal bowel and bladder function without the presence of pain nor incontinence in order to improve participation at home and in the community. Baseline: will assess next visit, 1/5 MMT/no endurance tested (07/28/21) Goal status: INITIAL    PLAN: PT Frequency: 1x/week  PT Duration: 12 weeks  Planned Interventions: Therapeutic exercises, Therapeutic activity, Neuromuscular re-education, Balance training, Gait training, Patient/Family education, Joint mobilization, Spinal mobilization, Cryotherapy, Moist heat, Taping, and Manual therapy  Plan For Next Session: manual SIJ/lower back, lifting techniques/standing core   Freya Zobrist, PT, DPT  08/18/2021, 4:06 PM

## 2021-08-26 ENCOUNTER — Ambulatory Visit: Payer: BC Managed Care – PPO

## 2021-08-26 DIAGNOSIS — M6289 Other specified disorders of muscle: Secondary | ICD-10-CM

## 2021-08-26 DIAGNOSIS — R278 Other lack of coordination: Secondary | ICD-10-CM | POA: Diagnosis not present

## 2021-08-26 DIAGNOSIS — M533 Sacrococcygeal disorders, not elsewhere classified: Secondary | ICD-10-CM

## 2021-08-26 DIAGNOSIS — M6281 Muscle weakness (generalized): Secondary | ICD-10-CM

## 2021-08-26 NOTE — Therapy (Signed)
OUTPATIENT PHYSICAL THERAPY FEMALE PELVIC TREATMENT   Patient Name: Yesenia Davis MRN: [id number] DOB:1975-05-31, 46 y.o., female Today's Date: 08/26/2021   PT End of Session - 08/26/21 1529     Visit Number 5    Number of Visits 12    Date for PT Re-Evaluation 10/06/21    Authorization Type IE: 07/14/21    PT Start Time 1530    PT Stop Time 1610    PT Time Calculation (min) 40 min    Activity Tolerance Patient tolerated treatment well             Past Medical History:  Diagnosis Date   Acne vulgaris    Epigastric pain    Gastritis    GERD (gastroesophageal reflux disease)    Gestational diabetes    Migraines    Past Surgical History:  Procedure Laterality Date   CHOLECYSTECTOMY  1996   cyst removed     from neck- b9   DILATION AND CURETTAGE OF UTERUS     TUBAL LIGATION Bilateral 08/21/2020   Procedure: POST PARTUM TUBAL LIGATION;  Surgeon: Linzie Collin, MD;  Location: [facility] ORS;  Service: Gynecology;  Laterality: Bilateral;   Patient Active Problem List   Diagnosis Date Noted   History of gestational diabetes 04/08/2021   Shingles outbreak 04/25/2020    PCP: Doreene Burke, CNM  REFERRING PROVIDER: Doreene Burke, CNM   REFERRING DIAG:  [phone] (ICD-10-CM) - Pelvic floor weakness   THERAPY DIAG:  Other lack of coordination  Sacrococcygeal disorders, not elsewhere classified  Muscle weakness (generalized)  Pelvic floor dysfunction  Rationale for Evaluation and Treatment: Rehabilitation  ONSET DATE: Over a year ago, but last 3 months worse                                                                                                                                                                                         PRECAUTIONS: None  WEIGHT BEARING RESTRICTIONS: No  FALLS:  Has patient fallen in last 6 months? No  OCCUPATION/SOCIAL ACTIVITIES: Standing job, pressing socks   PLOF: Independent   CHIEF CONCERN: Pt has tried pelvic  floor PT before for lower abdominal pain and some pressure felt at the vaginal opening, but not how Pt is feeling it today. Pt has been feeling "what I think is my cervix" when I'm standing for long periods of time or walking. It feels like a tampon is hanging and is about to come out. Pt also feels "something hanging out" when she wipes but does not get in the way of urination. Pt has had to stop dancing because she feels like whatever is there  is going to come out and down to the floor. Pt said the doctor that she went to did not tell her about a prolapse and when she examined her and asked to cough, the Dr did not say anything. Pt has noticed more bulging out of an organ in a squat position. During sexual relations, Pt occasionally has pain vaginally but it is described more as pressure days after penetrative sex. When the Pt sneezes or coughs, she does have urinary leakage. During her last menstrual cycle, Pt had a very sharp pain at the vagina and it took the breath out of her. She has not had a hx of very painful menstrual periods. Pt has taken Tylenol for the pain and it does aleve the pain when it arises. Pt has had a hx of buttock/tailbone pain, and Pt did have a fall when she was a little girl on the tailbone. Pt did not have any fractures, but she is unable to sit on hard surfaces without pain.     LIVING ENVIRONMENT: Lives with: lives with their family and lives with their spouse Lives in: House/apartment   PATIENT GOALS: Pt would like to not feel the pressure at the end of the day, help with feeling in control of leakage     UROLOGICAL HISTORY Fluid intake: Yes: agua, soda every now and then    Pain with urination: No Fully empty bladder: No Stream: stop and go intermittent, strong  Urgency: Yes Frequency: 4x at work, 6x at home Nocturia: 3x Leakage: Coughing, Sneezing, Exercise, and Intercourse Pads: Yes Type: pantyliners to heavier flow  Amount: 2x/day but does not wear  everyday  Bladder control (0-10): 3/10   GASTROINTESTINAL HISTORY  Pain with bowel movement: No Type of bowel movement:Type (Bristol Stool Scale) 4 Frequency: every other day  Fully empty rectum: No Leakage: No Pads: No   SEXUAL HISTORY/FUNCTION Pain with intercourse: No Ability to have vaginal penetration: Yes   OBSTETRICAL HISTORY Vaginal deliveries: G5P4 Tearing: Yes: with 2 children Currently pregnant: No  GYNECOLOGICAL HISTORY Hysterectomy: no, tubal ligation Pelvic Organ Prolapse: None, but has sxs  Heaviness/pressure: yes   SUBJECTIVE:  Pt had an episode of pain in the lower abdominals and going towards the coccyx, 8/10 yesterday but it went away after rest. Pt could not pinpoint reasoning behind pain.    PAIN:  Are you having pain?  No   OBJECTIVE: (07/28/21)   COGNITION: Overall cognitive status: Within functional limits for tasks assessed     POSTURE:  Iliac crest height: WNL Pelvic obliquity: WNL     RANGE OF MOTION:  (Norm range in degrees)  LEFT RIGHT  Lumbar forward flexion (65):  WNL    Lumbar extension (30): WNL    Lumbar lateral flexion (25):  WNL WNL  Thoracic and Lumbar rotation (30 degrees):    WNL WNL  Hip Flexion (0-125):   WNL WNL  Hip IR (0-45):  WNL WNL  Hip ER (0-45):  WNL WNL  Hip Adduction:   WNL WNL  Hip Abduction (0-40):  WNL WNL  Hip extension (0-15):  WNL WNL  (*= pain, Blank rows = not tested)   STRENGTH: MMT    RLE  LLE  Hip Flexion 4 4  Hip Extension 4 4  Hip Abduction  5 5  Hip Adduction     Hip ER  5 5  Hip IR  5 5  Knee Extension 5 5  Knee Flexion 4 4  Dorsiflexion  Plantarflexion (seated) 5 5  (*= pain, Blank rows = not tested)   SPECIAL TESTS:   FABER (SN 81): negative B FADIR (SN 94): negative B   PALPATION:  Abdominal:  Diastasis: none   Rib flare: present B RLQ: tenderness upon palpation more than the LLQ   L coccyx bony prominence - tender upon palpation - surrounding musculature  (gluteals/PFM) R coccyx bony prominence - tender upon palpation   L4-S1 - hypomobile  SIJ bilaterally - hypomobile with increased pain   Coccyx can move into slight extension when asked to sustain IAP increase "bear down" with no pain    EXTERNAL PELVIC EXAM: Patient educated on the purpose of the pelvic exam and articulated understanding; patient consented to the exam verbally. Deferred 2/2 time constraints  Breath coordination: present but inconsistent  Voluntary Contraction: present, 1/5 MMT Relaxation: delayed  Perineal movement with sustained IAP increase ("bear down"): descent Perineal movement with rapid IAP increase ("cough"): no change  Pubic symphysis: no tenderness  (0= no contraction, 1= flicker, 2= weak squeeze, 3= fair squeeze with lift, 4= good squeeze and lift against resistance, 5= strong squeeze against strong resistance)    TODAY'S TREATMENT:  Neuromuscular Re-education: Review of PFM lengthening techniques with diaphragmatic breathing and for pain modulation, VCs and TCs as needed              B Single knee to chest              Cat/cow             Lateral trunk rotation              Quadruped tail wags             Prone hip IR  Seated diaphragmatic breathing with VCs and TCs for downregulation of the nervous system and improved management of IAP Seated TrA activation for improved IAP management, cueing required to avoid elevation of shoulders   Discussion and simulation of work station and ergonomics to decrease lower back torque and decreased pressure felt at the vaginal opening. Coordinated breath taught with movement during hanging of the socks onto the machine.  Emphasis on 45 degree angle towards object for proper body mechanics  Using exhale with movement in order to avoid increase vaginal pressure or worsening or prolpase   STSs with coordinated breath, x10, for improved IAP management    Patient response to interventions: Pt feels increased  pressure at vaginal opening when picking up her kids.    Patient Education:  Patient provided with HEP: STSs with breath, seated TrA activation, seated diaphragmatic breathing. Patient educated throughout session on appropriate technique and form using multi-modal cueing, HEP, and activity modification. Patient will continue to benefit from further education in order to maximize compliance and understanding for long-term therapeutic gains.    ASSESSMENT:  Clinical Impression: Patient presents to clinic with excellent motivation to participate in today's session. Pt continues to demonstrate deficits in IAP management, PFM coordination, PFM strength, PFM endurance, LE strength, posture and pain. Pt arrived to clinic with no pain. Part of session consisted of reviewing HEP (pain modulation techniques and PFM lengthening techniques) as Pt had questions. Discussion had on using her breath in standing and sitting to aid her during work hours while reducing bodily tension. 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 Pt verbalized understanding. Pt required  moderate VCs and TCs for proper technique and body mechanics during other active interventions. Pt responded well to active and educational interventions. Patient will continue to benefit from skilled therapeutic intervention to address deficits in IAP management, PFM coordination, PFM strength, PFM endurance, LE strength, posture and pain in order to increase PLOF and improve overall QOL.    Objective Impairments: decreased activity tolerance, decreased coordination, decreased endurance, decreased strength, improper body mechanics, postural dysfunction, and pain.   Activity Limitations: carrying, lifting, sitting, standing, transfers, continence, toileting, locomotion level, and caring for  others  Personal Factors: Age, Past/current experiences, Time since onset of injury/illness/exacerbation, and 1-2 comorbidities: prediabetic and migraines  are also affecting patient's functional outcome.   Rehab Potential: Good  Clinical Decision Making: Evolving/moderate complexity  Evaluation Complexity: Moderate   GOALS: Goals reviewed with patient? Yes  SHORT TERM GOALS: Target date: 10/07/2021  Patient will decrease worst tailbone pain as reported on NPRS by at least 2 points to demonstrate clinically significant reduction in pain in order to restore/improve function and overall QOL. Baseline: 9/10 Goal status: INITIAL   LONG TERM GOALS: Target date: 11/18/2021   Patient will score  >/= 59 and 62 on FOTO Bowel Constipation/Urinary Problem in order to demonstrate improved PFM coordination, IAP management  and overall improved QOL.  Baseline: both 52 Goal status: INITIAL  2.  Patient will report confidence in ability to control bladder > 6/10 in order to demonstrate improved function and ability to participate more fully in activities at home and in the community. Baseline: 3/10 Goal status: INITIAL  3.  Patient will report less than 5 incidents of stress urinary incontinence over the course of 3 weeks while coughing/sneezing/physical activity/sexual activity in order to demonstrate improved PFM coordination, strength, and function for improved overall QOL. Baseline: leakage occurs every time with increased IAP Goal status: INITIAL  4.  Patient will be able to articulate decreased pressure at the vaginal opening and demonstrate 3-5 postures that encourage gravity assisted repositioning of pelvic organs to decrease discomfort and pressure at end of day in order to participate more fully in activities at home and in the community.  Baseline: 8/10 pressure at the end of the day/feeling of "something" falling Goal status: INITIAL  5.  Patient will demonstrate circumferential and  sequential contraction of >4/5 MMT, > 6 sec hold x10 and 5 consecutive quick flicks with </= 10 min rest between testing bouts, and relaxation of the PFM coordinated with breath for improved management of intra-abdominal pressure and normal bowel and bladder function without the presence of pain nor incontinence in order to improve participation at home and in the community. Baseline: will assess next visit, 1/5 MMT/no endurance tested (07/28/21) Goal status: INITIAL    PLAN: PT Frequency: 1x/week  PT Duration: 12 weeks  Planned Interventions: Therapeutic exercises, Therapeutic activity, Neuromuscular re-education, Balance training, Gait training, Patient/Family education, Joint mobilization, Spinal mobilization, Cryotherapy, Moist heat, Taping, and Manual therapy  Plan For Next Session: FOTO, lifting techniques practice, STSs?   Zyheir Daft, PT, DPT  08/26/2021, 3:30 PM

## 2021-09-02 ENCOUNTER — Encounter: Payer: Self-pay | Admitting: Emergency Medicine

## 2021-09-02 ENCOUNTER — Ambulatory Visit: Payer: BC Managed Care – PPO

## 2021-09-02 DIAGNOSIS — R933 Abnormal findings on diagnostic imaging of other parts of digestive tract: Secondary | ICD-10-CM | POA: Diagnosis not present

## 2021-09-02 DIAGNOSIS — Z79899 Other long term (current) drug therapy: Secondary | ICD-10-CM | POA: Diagnosis not present

## 2021-09-02 DIAGNOSIS — R739 Hyperglycemia, unspecified: Secondary | ICD-10-CM | POA: Insufficient documentation

## 2021-09-02 DIAGNOSIS — K567 Ileus, unspecified: Secondary | ICD-10-CM | POA: Diagnosis not present

## 2021-09-02 DIAGNOSIS — R112 Nausea with vomiting, unspecified: Secondary | ICD-10-CM | POA: Diagnosis present

## 2021-09-02 LAB — CBC
HCT: 45.1 % (ref 36.0–46.0)
Hemoglobin: 14.4 g/dL (ref 12.0–15.0)
MCH: 28.3 pg (ref 26.0–34.0)
MCHC: 31.9 g/dL (ref 30.0–36.0)
MCV: 88.6 fL (ref 80.0–100.0)
Platelets: 297 10*3/uL (ref 150–400)
RBC: 5.09 MIL/uL (ref 3.87–5.11)
RDW: 13.2 % (ref 11.5–15.5)
WBC: 8.1 10*3/uL (ref 4.0–10.5)
nRBC: 0 % (ref 0.0–0.2)

## 2021-09-02 LAB — URINALYSIS, ROUTINE W REFLEX MICROSCOPIC
Bacteria, UA: NONE SEEN
Bilirubin Urine: NEGATIVE
Glucose, UA: NEGATIVE mg/dL
Hgb urine dipstick: NEGATIVE
Ketones, ur: NEGATIVE mg/dL
Leukocytes,Ua: NEGATIVE
Nitrite: NEGATIVE
Protein, ur: 30 mg/dL — AB
Specific Gravity, Urine: 1.027 (ref 1.005–1.030)
pH: 7 (ref 5.0–8.0)

## 2021-09-02 LAB — COMPREHENSIVE METABOLIC PANEL
ALT: 28 U/L (ref 0–44)
AST: 31 U/L (ref 15–41)
Albumin: 4.2 g/dL (ref 3.5–5.0)
Alkaline Phosphatase: 85 U/L (ref 38–126)
Anion gap: 9 (ref 5–15)
BUN: 16 mg/dL (ref 6–20)
CO2: 25 mmol/L (ref 22–32)
Calcium: 8.8 mg/dL — ABNORMAL LOW (ref 8.9–10.3)
Chloride: 101 mmol/L (ref 98–111)
Creatinine, Ser: 0.62 mg/dL (ref 0.44–1.00)
GFR, Estimated: >60 mL/min (ref >60)
Glucose, Bld: 136 mg/dL — ABNORMAL HIGH (ref 70–99)
Potassium: 3.8 mmol/L (ref 3.5–5.1)
Sodium: 135 mmol/L (ref 135–145)
Total Bilirubin: 1.2 mg/dL (ref 0.3–1.2)
Total Protein: 7.9 g/dL (ref 6.5–8.1)

## 2021-09-02 LAB — POC URINE PREG, ED: Preg Test, Ur: NEGATIVE

## 2021-09-02 LAB — LIPASE, BLOOD: Lipase: 26 U/L (ref 11–51)

## 2021-09-02 MED ORDER — ONDANSETRON 4 MG PO TBDP
4.0000 mg | ORAL_TABLET | Freq: Once | ORAL | Status: AC | PRN
  Administered 2021-09-02: 4 mg via ORAL
  Filled 2021-09-02: qty 1

## 2021-09-02 NOTE — ED Triage Notes (Signed)
Pt presents via POV with complaints of lower abdominal pain with associated nausea that started today. Pt notes having an episode like this 2 weeks ago and it resolved. Pt took tylenol PTA without out improvement. Denies Fevers, CP or SOB.

## 2021-09-03 ENCOUNTER — Emergency Department: Payer: BC Managed Care – PPO

## 2021-09-03 ENCOUNTER — Encounter: Payer: Self-pay | Admitting: Internal Medicine

## 2021-09-03 ENCOUNTER — Observation Stay
Admission: EM | Admit: 2021-09-03 | Discharge: 2021-09-04 | Disposition: A | Discharge status: Home / Self Care | Payer: BC Managed Care – PPO | Attending: Internal Medicine | Admitting: Internal Medicine

## 2021-09-03 ENCOUNTER — Observation Stay: Payer: BC Managed Care – PPO | Admitting: Anesthesiology

## 2021-09-03 ENCOUNTER — Other Ambulatory Visit: Payer: Self-pay

## 2021-09-03 ENCOUNTER — Encounter
Admission: EM | Disposition: A | Discharge status: Home / Self Care | Payer: Self-pay | Source: Home / Self Care | Attending: Emergency Medicine

## 2021-09-03 DIAGNOSIS — R112 Nausea with vomiting, unspecified: Secondary | ICD-10-CM

## 2021-09-03 DIAGNOSIS — R1084 Generalized abdominal pain: Secondary | ICD-10-CM

## 2021-09-03 DIAGNOSIS — R933 Abnormal findings on diagnostic imaging of other parts of digestive tract: Secondary | ICD-10-CM

## 2021-09-03 DIAGNOSIS — K567 Ileus, unspecified: Secondary | ICD-10-CM

## 2021-09-03 HISTORY — DX: Prediabetes: R73.03

## 2021-09-03 HISTORY — PX: ESOPHAGOGASTRODUODENOSCOPY (EGD) WITH PROPOFOL: SHX5813

## 2021-09-03 SURGERY — ESOPHAGOGASTRODUODENOSCOPY (EGD) WITH PROPOFOL
Anesthesia: General

## 2021-09-03 MED ORDER — ENOXAPARIN SODIUM 40 MG/0.4ML IJ SOSY
40.0000 mg | PREFILLED_SYRINGE | INTRAMUSCULAR | Status: DC
  Administered 2021-09-03: 40 mg via SUBCUTANEOUS
  Filled 2021-09-03: qty 0.4

## 2021-09-03 MED ORDER — DEXAMETHASONE SODIUM PHOSPHATE 10 MG/ML IJ SOLN
INTRAMUSCULAR | Status: DC | PRN
  Administered 2021-09-03: 10 mg via INTRAVENOUS

## 2021-09-03 MED ORDER — SODIUM CHLORIDE 0.9 % IV SOLN: INTRAVENOUS | Status: DC

## 2021-09-03 MED ORDER — PROPOFOL 10 MG/ML IV BOLUS
INTRAVENOUS | Status: DC | PRN
  Administered 2021-09-03: 150 mg via INTRAVENOUS

## 2021-09-03 MED ORDER — MORPHINE SULFATE (PF) 2 MG/ML IV SOLN: 1.0000 mg | INTRAVENOUS | Status: DC | PRN

## 2021-09-03 MED ORDER — LACTATED RINGERS IV BOLUS
1000.0000 mL | Freq: Once | INTRAVENOUS | Status: AC
  Administered 2021-09-03: 1000 mL via INTRAVENOUS

## 2021-09-03 MED ORDER — DOCUSATE SODIUM 100 MG PO CAPS: 200.0000 mg | ORAL_CAPSULE | Freq: Two times a day (BID) | ORAL | Status: DC | PRN

## 2021-09-03 MED ORDER — GLYCOPYRROLATE 0.2 MG/ML IJ SOLN
INTRAMUSCULAR | Status: DC | PRN
  Administered 2021-09-03: .2 mg via INTRAVENOUS

## 2021-09-03 MED ORDER — MORPHINE SULFATE (PF) 4 MG/ML IV SOLN
4.0000 mg | Freq: Once | INTRAVENOUS | Status: AC
  Administered 2021-09-03: 4 mg via INTRAVENOUS
  Filled 2021-09-03: qty 1

## 2021-09-03 MED ORDER — IOHEXOL 350 MG/ML SOLN
100.0000 mL | Freq: Once | INTRAVENOUS | Status: AC | PRN
  Administered 2021-09-03: 100 mL via INTRAVENOUS

## 2021-09-03 MED ORDER — ONDANSETRON HCL 4 MG/2ML IJ SOLN
INTRAMUSCULAR | Status: DC | PRN
  Administered 2021-09-03: 4 mg via INTRAVENOUS

## 2021-09-03 MED ORDER — LIDOCAINE HCL (CARDIAC) PF 100 MG/5ML IV SOSY
PREFILLED_SYRINGE | INTRAVENOUS | Status: DC | PRN
  Administered 2021-09-03: 100 mg via INTRAVENOUS

## 2021-09-03 MED ORDER — PHENYLEPHRINE 80 MCG/ML (10ML) SYRINGE FOR IV PUSH (FOR BLOOD PRESSURE SUPPORT)
PREFILLED_SYRINGE | INTRAVENOUS | Status: DC | PRN
  Administered 2021-09-03: 160 ug via INTRAVENOUS

## 2021-09-03 MED ORDER — ONDANSETRON HCL 4 MG/2ML IJ SOLN: 4.0000 mg | Freq: Four times a day (QID) | INTRAMUSCULAR | Status: DC | PRN

## 2021-09-03 MED ORDER — ONDANSETRON HCL 4 MG/2ML IJ SOLN
4.0000 mg | INTRAMUSCULAR | Status: AC
  Administered 2021-09-03: 4 mg via INTRAVENOUS
  Filled 2021-09-03: qty 2

## 2021-09-03 MED ORDER — SUCCINYLCHOLINE CHLORIDE 200 MG/10ML IV SOSY
PREFILLED_SYRINGE | INTRAVENOUS | Status: DC | PRN
  Administered 2021-09-03: 100 mg via INTRAVENOUS

## 2021-09-03 MED ORDER — ACETAMINOPHEN 325 MG PO TABS
650.0000 mg | ORAL_TABLET | Freq: Four times a day (QID) | ORAL | Status: DC | PRN
  Administered 2021-09-03: 650 mg via ORAL
  Filled 2021-09-03: qty 2

## 2021-09-03 MED ORDER — ESMOLOL HCL 100 MG/10ML IV SOLN
INTRAVENOUS | Status: DC | PRN
  Administered 2021-09-03: 20 mg via INTRAVENOUS

## 2021-09-03 NOTE — H&P (Signed)
History and Physical    Patient: Yesenia Davis VPX:[id number] DOB: 04/10/1975 DOA: 09/03/2021 DOS: the patient was seen and examined on 09/03/2021 PCP: Doreene Burke, CNM  Patient coming from: Home  Chief Complaint:  Chief Complaint  Patient presents with   Nausea    HPI: Yesenia Davis is a 46 y.o. female with medical history significant for Migraines, seasonal allergies, GERD, prior cholecystectomy who presents to the ED with abdominal pain and protracted nausea.  Denies fever chills, diarrhea or dysuria.  Denies cough, chest pain or shortness of breath. ED course and data review: Tmax 99.3 with otherwise normal vitals.  CBC and CMP W NL.  Lipase and LFTs WNL, urinalysis unremarkable.  Pregnancy test negative CT abdomen and pelvis showing the following: IMPRESSION: 1. Segmental narrowing of the mid gastric body with dilatation of the stomach proximally. Further evaluation with upper GI study or endoscopy recommended to exclude underlying infiltrative neoplasm or stricture. 2. Multiple fluid-filled loops of small bowel throughout the abdomen may represent mild enteritis with associated ileus. Obstruction is less likely. 3. Colonic diverticulosis. Normal appendix.  Patient treated with Zofran.  Hospitalist consulted for admission due to concern for CT findings.   Review of Systems: As mentioned in the history of present illness. All other systems reviewed and are negative.  Past Medical History:  Diagnosis Date   Acne vulgaris    Epigastric pain    Gastritis    GERD (gastroesophageal reflux disease)    Gestational diabetes    Migraines    Past Surgical History:  Procedure Laterality Date   CHOLECYSTECTOMY  1996   cyst removed     from neck- b9   DILATION AND CURETTAGE OF UTERUS     TUBAL LIGATION Bilateral 08/21/2020   Procedure: POST PARTUM TUBAL LIGATION;  Surgeon: Linzie Collin, MD;  Location: [facility];  Service: Gynecology;  Laterality: Bilateral;   Social  History:  reports that she has never smoked. She has never used smokeless tobacco. She reports that she does not drink alcohol and does not use drugs.  Allergies  Allergen Reactions   Ciprofloxacin Hcl Rash    Family History  Problem Relation Age of Onset   Breast cancer Maternal Aunt        22's   Diabetes Father    Diabetes Mother    Diabetes Sister    Ovarian cancer Neg Hx    Colon cancer Neg Hx    Heart disease Neg Hx     Prior to Admission medications   Medication Sig Start Date End Date Taking? Authorizing Provider  acetaminophen (TYLENOL) 325 MG tablet Take 650 mg by mouth every 6 (six) hours as needed for moderate pain.   Yes [provider]  Prenatal 27-1 MG TABS TAKE 1 TABLET DAILY WHILE TRYING TO CONCEIVE, PREGNANT AND BREASTFEEDING. Patient not taking: Reported on 09/03/2021 04/01/20   [provider]    Physical Exam: Vitals:   09/02/21 2053 09/02/21 2054 09/03/21 0017  BP: 119/82  121/78  Pulse: 97  98  Resp: 20  20  Temp: 99.3 F (37.4 C)  98.7 F (37.1 C)  TempSrc: Oral  Oral  SpO2: 100%  99%  Weight:  67.1 kg   Height:  5\' 4"  (1.626 m)    Physical Exam Vitals and nursing note reviewed.  Constitutional:      General: She is not in acute distress.    Comments: Appears uncomfortable related to pain  HENT:     Head: Normocephalic  and atraumatic.  Cardiovascular:     Rate and Rhythm: Normal rate and regular rhythm.     Heart sounds: Normal heart sounds.  Pulmonary:     Effort: Pulmonary effort is normal.     Breath sounds: Normal breath sounds.  Abdominal:     Palpations: Abdomen is soft.     Tenderness: There is no abdominal tenderness.     Comments: Epigastric and periabdominal  Neurological:     Mental Status: Mental status is at baseline.     Labs on Admission: I have personally reviewed following labs and imaging studies  CBC: Recent Labs  Lab 09/02/21 2059  WBC 8.1  HGB 14.4  HCT 45.1  MCV 88.6  PLT [id number]    Basic Metabolic Panel: Recent Labs  Lab 09/02/21 2059  NA 135  K 3.8  CL 101  CO2 25  GLUCOSE 136*  BUN 16  CREATININE 0.62  CALCIUM 8.8*   GFR: Estimated Creatinine Clearance: 84.6 mL/min (by C-G formula based on SCr of 0.62 mg/dL). Liver Function Tests: Recent Labs  Lab 09/02/21 2059  AST 31  ALT 28  ALKPHOS 85  BILITOT 1.2  PROT 7.9  ALBUMIN 4.2   Recent Labs  Lab 09/02/21 2059  LIPASE 26   No results for input(s): "AMMONIA" in the last 168 hours. Coagulation Profile: No results for input(s): "INR", "PROTIME" in the last 168 hours. Cardiac Enzymes: No results for input(s): "CKTOTAL", "CKMB", "CKMBINDEX", "TROPONINI" in the last 168 hours. BNP (last 3 results) No results for input(s): "PROBNP" in the last 8760 hours. HbA1C: No results for input(s): "HGBA1C" in the last 72 hours. CBG: No results for input(s): "GLUCAP" in the last 168 hours. Lipid Profile: No results for input(s): "CHOL", "HDL", "LDLCALC", "TRIG", "CHOLHDL", "LDLDIRECT" in the last 72 hours. Thyroid Function Tests: No results for input(s): "TSH", "T4TOTAL", "FREET4", "T3FREE", "THYROIDAB" in the last 72 hours. Anemia Panel: No results for input(s): "VITAMINB12", "FOLATE", "FERRITIN", "TIBC", "IRON", "RETICCTPCT" in the last 72 hours. Urine analysis:    Component Value Date/Time   COLORURINE YELLOW (A) 09/02/2021 2057   APPEARANCEUR HAZY (A) 09/02/2021 2057   APPEARANCEUR Clear 02/08/2020 1125   LABSPEC 1.027 09/02/2021 2057   LABSPEC 1.030 09/26/2013 1647   PHURINE 7.0 09/02/2021 2057   GLUCOSEU NEGATIVE 09/02/2021 2057   GLUCOSEU Negative 09/26/2013 1647   HGBUR NEGATIVE 09/02/2021 2057   BILIRUBINUR NEGATIVE 09/02/2021 2057   BILIRUBINUR neg 08/12/2020 1023   BILIRUBINUR Negative 02/08/2020 1125   BILIRUBINUR Negative 09/26/2013 [place] 09/02/2021 2057   PROTEINUR 30 (A) 09/02/2021 2057   UROBILINOGEN 0.2 08/12/2020 1023   NITRITE NEGATIVE 09/02/2021 2057    LEUKOCYTESUR NEGATIVE 09/02/2021 2057   LEUKOCYTESUR Negative 09/26/2013 1647    Radiological Exams on Admission: CT ABDOMEN PELVIS W CONTRAST  Result Date: 09/03/2021 CLINICAL DATA:  Abdominal pain, nausea and vomiting. EXAM: CT ABDOMEN AND PELVIS WITH CONTRAST TECHNIQUE: Multidetector CT imaging of the abdomen and pelvis was performed using the standard protocol following bolus administration of intravenous contrast. RADIATION DOSE REDUCTION: This exam was performed according to the departmental dose-optimization program which includes automated exposure control, adjustment of the mA and/or kV according to patient size and/or use of iterative reconstruction technique. CONTRAST:  120mL OMNIPAQUE IOHEXOL 350 MG/ML SOLN COMPARISON:  CT abdomen pelvis dated 09/26/2013. FINDINGS: Lower chest: Images lung bases are clear. No intra-abdominal free air or free fluid. Hepatobiliary: The liver is unremarkable. No biliary ductal dilatation. Cholecystectomy. No retained calcified stone noted  in the central CBD. Pancreas: Unremarkable. No pancreatic ductal dilatation or surrounding inflammatory changes. Spleen: Normal in size without focal abnormality. Adrenals/Urinary Tract: The adrenal glands are unremarkable. The kidneys, visualized ureters, and urinary bladder appear unremarkable. Stomach/Bowel: The stomach is distended with mixture of fluid and ingested content. There is a 4.5 cm narrowed segment in the mid gastric body with distension of the stomach proximal to this segment. Although this may represent an area of contraction, and infiltrative process or stricture is a concern. Further evaluation with upper GI study or endoscopy is recommended. Multiple fluid-filled loops of small bowel measuring up to 3 cm noted throughout the abdomen. No transition identified. Findings may represent mild enteritis with associated ileus. Obstruction is less likely. There is loose stool in the proximal colon. Scattered distal  colonic diverticula without active inflammatory changes. The appendix is normal. Vascular/Lymphatic: The abdominal aorta and IVC are unremarkable. No portal venous gas. There is no adenopathy. Reproductive: The uterus is slightly heterogeneous. The right ovary is unremarkable. There is a 2 cm left ovarian follicle/cyst. No follow-up. Bilateral tubal ligation clips. Other: Small fat containing umbilical hernia. Musculoskeletal: No acute or significant osseous findings. IMPRESSION: 1. Segmental narrowing of the mid gastric body with dilatation of the stomach proximally. Further evaluation with upper GI study or endoscopy recommended to exclude underlying infiltrative neoplasm or stricture. 2. Multiple fluid-filled loops of small bowel throughout the abdomen may represent mild enteritis with associated ileus. Obstruction is less likely. 3. Colonic diverticulosis. Normal appendix. Electronically Signed   By: Elgie Collard M.D.   On: 09/03/2021 02:31     Data Reviewed: Relevant notes from primary care and specialist visits, past discharge summaries as available in EHR, including Care Everywhere. Prior diagnostic testing as pertinent to current admission diagnoses Updated medications and problem lists for reconciliation ED course, including vitals, labs, imaging, treatment and response to treatment Triage notes, nursing and pharmacy notes and ED provider's notes Notable results as noted in HPI   Assessment and Plan: * Nausea and vomiting, possible gastric outlet obstruction History of cholecystectomy Keep n.p.o. IV Protonix IV antiemetics, IV pain meds, IV fluids and supportive care GI consulted for consideration of endoscopy        DVT prophylaxis: Teds  Consults: GI Dr. Tobi Bastos  Advance Care Planning:   Code Status: Prior   Family Communication: none  Disposition Plan: Back to previous home environment  Severity of Illness: The appropriate patient status for this patient is  OBSERVATION. Observation status is judged to be reasonable and necessary in order to provide the required intensity of service to ensure the patient's safety. The patient's presenting symptoms, physical exam findings, and initial radiographic and laboratory data in the context of their medical condition is felt to place them at decreased risk for further clinical deterioration. Furthermore, it is anticipated that the patient will be medically stable for discharge from the hospital within 2 midnights of admission.   Author: Andris Baumann, MD 09/03/2021 3:27 AM  For on call review [email or web address].

## 2021-09-03 NOTE — ED Provider Notes (Signed)
[facility] Provider Note    Event Date/Time   First MD Initiated Contact with Patient 09/03/21 [phone]     (approximate)   History   Nausea   HPI  Yesenia Davis is a 46 y.o. female who presents for evaluation of a cute onset abdominal pain with nausea and vomiting.  She said that she had a similar episode a couple of days ago and it was milder and self-limited.  This time is very severe and she is extremely uncomfortable.  She has not had any recent pain when she urinates and no recent fever.  She denies chest pain or shortness of breath.  She has had similar episodes to this in the past but never this bad.  She said that she had an endoscopy about 5 years ago (she cannot remember exactly when) that told her she had some abnormal findings including gastritis and something else she cannot remember, but she does not usually have pain like this.  She had her gallbladder removed many years ago.  No history of kidney stones.  Nothing in particular seems to make the pain better or worse.  No recent trauma.     Physical Exam   Triage Vital Signs: ED Triage Vitals  Enc Vitals Group     BP 09/02/21 2053 119/82     Pulse Rate 09/02/21 2053 97     Resp 09/02/21 2053 20     Temp 09/02/21 2053 99.3 F (37.4 C)     Temp Source 09/02/21 2053 Oral     SpO2 09/02/21 2053 100 %     Weight 09/02/21 2054 67.1 kg (148 lb)     Height 09/02/21 2054 1.626 m (5\' 4" )     Head Circumference --      Peak Flow --      Pain Score --      Pain Loc --      Pain Edu? --      Excl. in GC? --     Most recent vital signs: Vitals:   09/02/21 2053 09/03/21 0017  BP: 119/82 121/78  Pulse: 97 98  Resp: 20 20  Temp: 99.3 F (37.4 C) 98.7 F (37.1 C)  SpO2: 100% 99%     General: Awake, peers very uncomfortable and in pain. CV:  Good peripheral perfusion.  Regular rate and rhythm. Resp:  Normal effort.  Lungs clear to auscultation. Abd:  Abdomen is somewhat distended.  Tender  to palpation throughout, not peritoneal but very tender with some guarding but no rebound. Other:  No focal neurological deficits.   ED Results / Procedures / Treatments   Labs (all labs ordered are listed, but only abnormal results are displayed) Labs Reviewed  COMPREHENSIVE METABOLIC PANEL - Abnormal; Notable for the following components:      Result Value   Glucose, Bld 136 (*)    Calcium 8.8 (*)    All other components within normal limits  URINALYSIS, ROUTINE W REFLEX MICROSCOPIC - Abnormal; Notable for the following components:   Color, Urine YELLOW (*)    APPearance HAZY (*)    Protein, ur 30 (*)    All other components within normal limits  LIPASE, BLOOD  CBC  POC URINE PREG, ED     RADIOLOGY See hospital course for details: The patient has an ileus as well as some abnormal gastric findings.    PROCEDURES:  Critical Care performed: No  Procedures   MEDICATIONS ORDERED IN ED: Medications  ondansetron (  ZOFRAN-ODT) disintegrating tablet 4 mg (4 mg Oral Given 09/02/21 2100)  morphine (PF) 4 MG/ML injection 4 mg (4 mg Intravenous Given 09/03/21 0142)  ondansetron (ZOFRAN) injection 4 mg (4 mg Intravenous Given 09/03/21 0141)  lactated ringers bolus 1,000 mL (1,000 mLs Intravenous New Bag/Given 09/03/21 0142)  iohexol (OMNIPAQUE) 350 MG/ML injection 100 mL (100 mLs Intravenous Contrast Given 09/03/21 0213)     IMPRESSION / MDM / ASSESSMENT AND PLAN / ED COURSE  I reviewed the triage vital signs and the nursing notes.                              Differential diagnosis includes, but is not limited to, choledocholithiasis, gastritis, SBO/ileus, diverticulitis, renal/ureteral colic , UTI/pyelonephritis, STD/PID/TOA, adnexal abnormality less likely.  Patient's presentation is most consistent with acute presentation with potential threat to life or bodily function.  Labs/studies ordered: CMP, lipase, CBC, urine pregnancy, urinalysis.  Labs are generally reassuring  with no leukocytosis, no LFT elevation, no elevated lipase, no evidence of UTI.  However the patient is in a great deal of distress.  I ordered morphine 4 mg IV, Zofran 4 mg IV, and LR 1 L IV bolus given her recent episodes of vomiting.  Given that she has no elevated LFTs and has already had a cholecystectomy, I will proceed with CT of the abdomen and pelvis with IV contrast for further assessment of the cause of her abdominal pain.   Clinical Course as of 09/03/21 0317  Wed Sep 03, 2021  0249 CT ABDOMEN PELVIS W CONTRAST I viewed and interpreted the patient's CT of the abdomen and pelvis.  There appears to be some distention but without an obvious transition point.  The radiologist interpreted as ileus, but also with some abnormal findings associated with the stomach itself.  However the patient says she has had a recent endoscopy.  I reassessed the patient and she is still having significant discomfort, distention, although her nausea and pain have improved a bit since the medications.  Given the findings of ileus and other abnormalities on the CT scan, as well as her ongoing symptoms of pain, distention, and nausea, it is likely best that we admit her to the hospital for further evaluation and probable GI consult, bowel rest, fluids, pain control etc.  I discussed this with the patient and she agrees with the plan and does not feel she could safely go home.  I am consulting the hospitalist for admission. [CF]  0315 Consulted by phone with Dr. Para March with the hospitalist service.  She will admit the patient for further management.  She will consult gastroenterology in the morning; we both agree that this is nonemergent and that a call to GI in the middle of the night is not necessary. [CF]    Clinical Course User Index [CF] Loleta Rose, MD     FINAL CLINICAL IMPRESSION(S) / ED DIAGNOSES   Final diagnoses:  Ileus (HCC)  Generalized abdominal pain  Nausea and vomiting, unspecified  vomiting type     Rx / DC Orders   ED Discharge Orders     None        Note:  This document was prepared using Dragon voice recognition software and may include unintentional dictation errors.   Loleta Rose, MD 09/03/21 [phone]

## 2021-09-03 NOTE — Anesthesia Postprocedure Evaluation (Signed)
Anesthesia Post Note  Patient: Yesenia Davis  Procedure(s) Performed: ESOPHAGOGASTRODUODENOSCOPY (EGD) WITH PROPOFOL  Patient location during evaluation: Endoscopy Anesthesia Type: General Level of consciousness: awake and alert Pain management: pain level controlled Vital Signs Assessment: post-procedure vital signs reviewed and stable Respiratory status: spontaneous breathing, nonlabored ventilation, respiratory function stable and patient connected to nasal cannula oxygen Cardiovascular status: blood pressure returned to baseline and stable Postop Assessment: no apparent nausea or vomiting Anesthetic complications: no   No notable events documented.   Last Vitals:  Vitals:   09/03/21 1459 09/03/21 1509  BP:  110/81  Pulse:    Resp:    Temp: (!) 36.3 C   SpO2:      Last Pain:  Vitals:   09/03/21 1509  TempSrc:   PainSc: 1                  Corinda Gubler

## 2021-09-03 NOTE — Anesthesia Preprocedure Evaluation (Signed)
Anesthesia Evaluation  Patient identified by MRN, date of birth, ID band Patient awake  General Assessment Comment:  Presented for protracted nausea and vomiting. None today, but CT scan shows dilated bowel loops and proximal stomach.  Reviewed: Allergy & Precautions, NPO status , Patient's Chart, lab work & pertinent test results  History of Anesthesia Complications Negative for: history of anesthetic complications  Airway Mallampati: II  TM Distance: >3 FB Neck ROM: Full    Dental no notable dental hx. (+) Teeth Intact   Pulmonary neg pulmonary ROS, neg sleep apnea, neg COPD, Patient abstained from smoking.Not current smoker,    Pulmonary exam normal breath sounds clear to auscultation       Cardiovascular Exercise Tolerance: Good METS(-) hypertension(-) CAD and (-) Past MI negative cardio ROS  (-) dysrhythmias  Rhythm:Regular Rate:Normal - Systolic murmurs    Neuro/Psych  Headaches, negative psych ROS   GI/Hepatic GERD  Poorly Controlled,(+)     (-) substance abuse  ,   Endo/Other  diabetes  Renal/GU negative Renal ROS     Musculoskeletal   Abdominal   Peds  Hematology   Anesthesia Other Findings Past Medical History: No date: Acne vulgaris No date: Epigastric pain No date: Gastritis No date: GERD (gastroesophageal reflux disease) No date: Gestational diabetes No date: Migraines No date: Pre-diabetes  Reproductive/Obstetrics                             Anesthesia Physical Anesthesia Plan  ASA: 2  Anesthesia Plan: General   Post-op Pain Management:    Induction: Intravenous and Rapid sequence  PONV Risk Score and Plan: 3 and Ondansetron and Dexamethasone  Airway Management Planned: Oral ETT  Additional Equipment: None  Intra-op Plan:   Post-operative Plan: Extubation in OR  Informed Consent: I have reviewed the patients History and Physical, chart, labs and  discussed the procedure including the risks, benefits and alternatives for the proposed anesthesia with the patient or authorized representative who has indicated his/her understanding and acceptance.     Dental advisory given  Plan Discussed with: CRNA and Surgeon  Anesthesia Plan Comments: (Discussed risks of anesthesia with patient, including PONV, sore throat, lip/dental/eye damage. Rare risks discussed as well, such as cardiorespiratory and neurological sequelae, and allergic reactions. Discussed the role of CRNA in patient's perioperative care. Patient understands.)        Anesthesia Quick Evaluation

## 2021-09-03 NOTE — Plan of Care (Signed)

## 2021-09-03 NOTE — Consult Note (Signed)
Wyline Mood , MD [address], [address], [place], [place], [address] [address], [address], [place], [place], [address] Phone: [phone]  Fax: [phone]  Consultation  Referring Provider:    Dr Para March Primary Care Physician:  Doreene Burke, CNM Primary Gastroenterologist:  None     Reason for Consultation:     abnormal ct scan  Date of Admission:  09/03/2021 Date of Consultation:  09/03/2021         HPI:   Yesenia Davis is a 46 y.o. female presented to the emergency room with acute abdominal pain nausea vomiting of 2 days duration, no vomiting since yesterday , having regular bowel movements, denies any nsaid use, has generalized abdominal discomfort. No weight loss.  She had a CT scan of the abdomenThat showed segmental narrowing of the mid gastric body with dilation of the stomach proximally further evaluation upper GI study or endoscopy recommended multiple fluid live loops of small bowel to the abdomen may represent enteritis with associated ideas obstruction is less likely.    On admission hemoglobin 13.1 g LFTs normal.  I have been consulted to evaluate for endoscopy  Past Medical History:  Diagnosis Date   Acne vulgaris    Epigastric pain    Gastritis    GERD (gastroesophageal reflux disease)    Gestational diabetes    Migraines     Past Surgical History:  Procedure Laterality Date   CHOLECYSTECTOMY  1996   cyst removed     from neck- b9   DILATION AND CURETTAGE OF UTERUS     TUBAL LIGATION Bilateral 08/21/2020   Procedure: POST PARTUM TUBAL LIGATION;  Surgeon: Linzie Collin, MD;  Location: [facility] ORS;  Service: Gynecology;  Laterality: Bilateral;    Prior to Admission medications   Medication Sig Start Date End Date Taking? Authorizing Provider  acetaminophen (TYLENOL) 325 MG tablet Take 650 mg by mouth every 6 (six) hours as needed for moderate pain.   Yes [provider]  Prenatal 27-1 MG TABS TAKE 1 TABLET DAILY WHILE TRYING TO CONCEIVE,  PREGNANT AND BREASTFEEDING. Patient not taking: Reported on 09/03/2021 04/01/20   [provider]    Family History  Problem Relation Age of Onset   Breast cancer Maternal Aunt        80's   Diabetes Father    Diabetes Mother    Diabetes Sister    Ovarian cancer Neg Hx    Colon cancer Neg Hx    Heart disease Neg Hx      Social History   Tobacco Use   Smoking status: Never   Smokeless tobacco: Never  Vaping Use   Vaping Use: Never used  Substance Use Topics   Alcohol use: No   Drug use: No    Allergies as of 09/02/2021 - Review Complete 09/02/2021  Allergen Reaction Noted   Ciprofloxacin hcl Rash 03/29/2015    Review of Systems:    All systems reviewed and negative except where noted in HPI.   Physical Exam:  Vital signs in last 24 hours: Temp:  [98.4 F (36.9 C)-99.3 F (37.4 C)] 98.8 F (37.1 C) (08/23 0758) Pulse Rate:  [76-98] 77 (08/23 0758) Resp:  [18-20] 18 (08/23 0600) BP: (93-121)/(60-82) 93/60 (08/23 0758) SpO2:  [96 %-100 %] 96 % (08/23 0758) Weight:  [67.1 kg] 67.1 kg (08/22 2054)   General:   Pleasant, cooperative in NAD Head:  Normocephalic and atraumatic. Eyes:   No icterus.   Conjunctiva pink. PERRLA.  Ears:  Normal auditory acuity. Neck:  Supple; no masses or thyroidomegaly Lungs: Respirations even and unlabored. Lungs clear to auscultation bilaterally.   No wheezes, crackles, or rhonchi.  Heart:  Regular rate and rhythm;  Without murmur, clicks, rubs or gallops Abdomen:  Soft, nondistended, nontender. Normal bowel sounds. No appreciable masses or hepatomegaly.  No rebound or guarding.  Neurologic:  Alert and oriented x3;  grossly normal neurologically. Skin:  Intact without significant lesions or rashes. Cervical Nodes:  No significant cervical adenopathy. Psych:  Alert and cooperative. Normal affect.  LAB RESULTS: Recent Labs    09/02/21 2059  WBC 8.1  HGB 14.4  HCT 45.1  PLT 297   BMET Recent Labs    09/02/21 2059  NA  135  K 3.8  CL 101  CO2 25  GLUCOSE 136*  BUN 16  CREATININE 0.62  CALCIUM 8.8*   LFT Recent Labs    09/02/21 2059  PROT 7.9  ALBUMIN 4.2  AST 31  ALT 28  ALKPHOS 85  BILITOT 1.2   PT/INR No results for input(s): "LABPROT", "INR" in the last 72 hours.  STUDIES: CT ABDOMEN PELVIS W CONTRAST  Result Date: 09/03/2021 CLINICAL DATA:  Abdominal pain, nausea and vomiting. EXAM: CT ABDOMEN AND PELVIS WITH CONTRAST TECHNIQUE: Multidetector CT imaging of the abdomen and pelvis was performed using the standard protocol following bolus administration of intravenous contrast. RADIATION DOSE REDUCTION: This exam was performed according to the departmental dose-optimization program which includes automated exposure control, adjustment of the mA and/or kV according to patient size and/or use of iterative reconstruction technique. CONTRAST:  OMNIPAQUE IOHEXOL 350 MG/ML SOLN COMPARISON:  CT abdomen pelvis dated 09/26/2013. FINDINGS: Lower chest: Images lung bases are clear. No intra-abdominal free air or free fluid. Hepatobiliary: The liver is unremarkable. No biliary ductal dilatation. Cholecystectomy. No retained calcified stone noted in the central CBD. Pancreas: Unremarkable. No pancreatic ductal dilatation or surrounding inflammatory changes. Spleen: Normal in size without focal abnormality. Adrenals/Urinary Tract: The adrenal glands are unremarkable. The kidneys, visualized ureters, and urinary bladder appear unremarkable. Stomach/Bowel: The stomach is distended with mixture of fluid and ingested content. There is a 4.5 cm narrowed segment in the mid gastric body with distension of the stomach proximal to this segment. Although this may represent an area of contraction, and infiltrative process or stricture is a concern. Further evaluation with upper GI study or endoscopy is recommended. Multiple fluid-filled loops of small bowel measuring up to 3 cm noted throughout the abdomen. No transition  identified. Findings may represent mild enteritis with associated ileus. Obstruction is less likely. There is loose stool in the proximal colon. Scattered distal colonic diverticula without active inflammatory changes. The appendix is normal. Vascular/Lymphatic: The abdominal aorta and IVC are unremarkable. No portal venous gas. There is no adenopathy. Reproductive: The uterus is slightly heterogeneous. The right ovary is unremarkable. There is a 2 cm left ovarian follicle/cyst. No follow-up. Bilateral tubal ligation clips. Other: Small fat containing umbilical hernia. Musculoskeletal: No acute or significant osseous findings. IMPRESSION: 1. Segmental narrowing of the mid gastric body with dilatation of the stomach proximally. Further evaluation with upper GI study or endoscopy recommended to exclude underlying infiltrative neoplasm or stricture. 2. Multiple fluid-filled loops of small bowel throughout the abdomen may represent mild enteritis with associated ileus. Obstruction is less likely. 3. Colonic diverticulosis. Normal appendix. Electronically Signed   By: Elgie Collard M.D.   On: 09/03/2021 02:31      Impression / Plan:  Yesenia Davis is a 46 y.o. y/o female whom I have been consulted for endoscopy in view of abnormal findings on the CT scan suggestive of gastric outlet obstruction when she presented to the ER with nausea vomiting.  Plan 1.  EGD today, may require intubation   I have discussed alternative options, risks & benefits,  which include, but are not limited to, bleeding, infection, perforation,respiratory complication & drug reaction.  The patient agrees with this plan & written consent will be obtained.      Thank you for involving me in the care of this patient.      LOS: 0 days   Wyline Mood, MD  09/03/2021, 8:13 AM

## 2021-09-03 NOTE — Op Note (Signed)
[facility] Gastroenterology Patient Name: Yesenia Davis Procedure Date: 09/03/2021 2:25 PM MRN: [id number] Account #: [id number] Date of Birth: Mar 04, 1975 Admit Type: Outpatient Age: 46 Room: [facility] ENDO ROOM 3 Gender: Female Note Status: Finalized Instrument Name: Upper Endoscope [id number] Procedure:             Upper GI endoscopy Indications:           Abnormal CT of the GI tract Providers:             Wyline Mood MD, MD Referring MD:          Doreene Burke (Referring MD) Medicines:             Monitored Anesthesia Care Complications:         No immediate complications. Procedure:             Pre-Anesthesia Assessment:                        - Prior to the procedure, a History and Physical was                         performed, and patient medications, allergies and                         sensitivities were reviewed. The patient's tolerance                         of previous anesthesia was reviewed.                        - The risks and benefits of the procedure and the                         sedation options and risks were discussed with the                         patient. All questions were answered and informed                         consent was obtained.                        - After reviewing the risks and benefits, the patient                         was deemed in satisfactory condition to undergo the                         procedure.                        - ASA Grade Assessment: II - A patient with mild                         systemic disease.                        After obtaining informed consent, the endoscope was                         passed under  direct vision. Throughout the procedure,                         the patient's blood pressure, pulse, and oxygen                         saturations were monitored continuously. The Endoscope                         was introduced through the mouth, and advanced to the                          third part of duodenum. The upper GI endoscopy was                         accomplished with ease. The patient tolerated the                         procedure well. Findings:      The esophagus was normal.      The examined duodenum was normal.      A small amount of food (residue) was found in the prepyloric region of       the stomach.      The cardia and gastric fundus were normal on retroflexion. Impression:            - Normal esophagus.                        - Normal examined duodenum.                        - A small amount of food (residue) in the stomach.                        - No specimens collected. Recommendation:        - Return patient to hospital ward for ongoing care.                        - Clear liquid diet.                        - Continue present medications. Procedure Code(s):     --- Professional ---                        [id number], Esophagogastroduodenoscopy, flexible,                         transoral; diagnostic, including collection of                         specimen(s) by brushing or washing, when performed                         (separate procedure) Diagnosis Code(s):     --- Professional ---                        R93.3, Abnormal findings on diagnostic imaging of  other parts of digestive tract CPT copyright 2019 [employer]. All rights reserved. The codes documented in this report are preliminary and upon coder review may  be revised to meet current compliance requirements. Jonathon Bellows, MD Jonathon Bellows MD, MD 09/03/2021 2:46:07 PM This report has been signed electronically. Number of Addenda: 0 Note Initiated On: 09/03/2021 2:25 PM Estimated Blood Loss:  Estimated blood loss: none.      [facility]

## 2021-09-03 NOTE — Anesthesia Procedure Notes (Signed)
Procedure Name: Intubation Date/Time: 09/03/2021 2:37 PM  Performed by: Mohammed Kindle, CRNAPre-anesthesia Checklist: Patient identified, Emergency Drugs available, Suction available and Patient being monitored Patient Re-evaluated:Patient Re-evaluated prior to induction Oxygen Delivery Method: Circle system utilized Preoxygenation: Pre-oxygenation with 100% oxygen Induction Type: IV induction, Rapid sequence and Cricoid Pressure applied Laryngoscope Size: McGraph and 3 Grade View: Grade I Tube type: Oral Tube size: 6.5 mm Number of attempts: 1 Airway Equipment and Method: Stylet and Oral airway Placement Confirmation: ETT inserted through vocal cords under direct vision, positive ETCO2, breath sounds checked- equal and bilateral and CO2 detector Secured at: 21 cm Tube secured with: Tape Dental Injury: Teeth and Oropharynx as per pre-operative assessment

## 2021-09-03 NOTE — Assessment & Plan Note (Addendum)
History of cholecystectomy Keep n.p.o. IV Protonix IV antiemetics, IV pain meds, IV fluids and supportive care GI consulted for consideration of endoscopy

## 2021-09-03 NOTE — Progress Notes (Signed)
PROGRESS NOTE    Yesenia Davis  HCW:[id number] DOB: 1975-08-15 DOA: 09/03/2021 PCP: Doreene Burke, CNM    Assessment & Plan:   Principal Problem:   Nausea and vomiting, possible gastric outlet obstruction  Assessment and Plan: Nausea and vomiting: etiology unclear, possible gastric outlet obstruction. EGD today as per GI.  Continue on IV PPI. Zofran prn. Hx of cholecystectomy   Hyperglycemia: no hx of DM.    DVT prophylaxis: lovenox  Code Status: full  Family Communication:  Disposition Plan: likely d/c back home    Level of care: Med-Surg  Status is: Observation The patient remains OBS appropriate and will d/c before 2 midnights.    Consultants:  GI   Procedures:   Antimicrobials:    Subjective: Pt c/o nausea   Objective: Vitals:   09/03/21 0017 09/03/21 0513 09/03/21 0600 09/03/21 0758  BP: 121/78  112/72 93/60  Pulse: 98  76 77  Resp: 20  18   Temp: 98.7 F (37.1 C) 98.6 F (37 C) 98.4 F (36.9 C) 98.8 F (37.1 C)  TempSrc: Oral Oral Oral Oral  SpO2: 99%  99% 96%  Weight:      Height:       No intake or output data in the 24 hours ending 09/03/21 0936 Filed Weights   09/02/21 2054  Weight: 67.1 kg    Examination:  General exam: Appears calm and comfortable  Respiratory system: Clear to auscultation. Respiratory effort normal. Cardiovascular system: S1 & S2 +. No rubs, gallops or clicks.  Gastrointestinal system: Abdomen is nondistended, soft and nontender.  Normal bowel sounds heard. Central nervous system: Alert and oriented. Moves all extremities  Psychiatry: Judgement and insight appear normal. Flat mood and affect    Data Reviewed: I have personally reviewed following labs and imaging studies  CBC: Recent Labs  Lab 09/02/21 2059  WBC 8.1  HGB 14.4  HCT 45.1  MCV 88.6  PLT 297   Basic Metabolic Panel: Recent Labs  Lab 09/02/21 2059  NA 135  K 3.8  CL 101  CO2 25  GLUCOSE 136*  BUN 16  CREATININE 0.62  CALCIUM  8.8*   GFR: Estimated Creatinine Clearance: 84.6 mL/min (by C-G formula based on SCr of 0.62 mg/dL). Liver Function Tests: Recent Labs  Lab 09/02/21 2059  AST 31  ALT 28  ALKPHOS 85  BILITOT 1.2  PROT 7.9  ALBUMIN 4.2   Recent Labs  Lab 09/02/21 2059  LIPASE 26   No results for input(s): "AMMONIA" in the last 168 hours. Coagulation Profile: No results for input(s): "INR", "PROTIME" in the last 168 hours. Cardiac Enzymes: No results for input(s): "CKTOTAL", "CKMB", "CKMBINDEX", "TROPONINI" in the last 168 hours. BNP (last 3 results) No results for input(s): "PROBNP" in the last 8760 hours. HbA1C: No results for input(s): "HGBA1C" in the last 72 hours. CBG: No results for input(s): "GLUCAP" in the last 168 hours. Lipid Profile: No results for input(s): "CHOL", "HDL", "LDLCALC", "TRIG", "CHOLHDL", "LDLDIRECT" in the last 72 hours. Thyroid Function Tests: No results for input(s): "TSH", "T4TOTAL", "FREET4", "T3FREE", "THYROIDAB" in the last 72 hours. Anemia Panel: No results for input(s): "VITAMINB12", "FOLATE", "FERRITIN", "TIBC", "IRON", "RETICCTPCT" in the last 72 hours. Sepsis Labs: No results for input(s): "PROCALCITON", "LATICACIDVEN" in the last 168 hours.  No results found for this or any previous visit (from the past 240 hour(s)).       Radiology Studies: CT ABDOMEN PELVIS W CONTRAST  Result Date: 09/03/2021 CLINICAL DATA:  Abdominal pain,  nausea and vomiting. EXAM: CT ABDOMEN AND PELVIS WITH CONTRAST TECHNIQUE: Multidetector CT imaging of the abdomen and pelvis was performed using the standard protocol following bolus administration of intravenous contrast. RADIATION DOSE REDUCTION: This exam was performed according to the departmental dose-optimization program which includes automated exposure control, adjustment of the mA and/or kV according to patient size and/or use of iterative reconstruction technique. CONTRAST:  OMNIPAQUE IOHEXOL 350 MG/ML SOLN  COMPARISON:  CT abdomen pelvis dated 09/26/2013. FINDINGS: Lower chest: Images lung bases are clear. No intra-abdominal free air or free fluid. Hepatobiliary: The liver is unremarkable. No biliary ductal dilatation. Cholecystectomy. No retained calcified stone noted in the central CBD. Pancreas: Unremarkable. No pancreatic ductal dilatation or surrounding inflammatory changes. Spleen: Normal in size without focal abnormality. Adrenals/Urinary Tract: The adrenal glands are unremarkable. The kidneys, visualized ureters, and urinary bladder appear unremarkable. Stomach/Bowel: The stomach is distended with mixture of fluid and ingested content. There is a 4.5 cm narrowed segment in the mid gastric body with distension of the stomach proximal to this segment. Although this may represent an area of contraction, and infiltrative process or stricture is a concern. Further evaluation with upper GI study or endoscopy is recommended. Multiple fluid-filled loops of small bowel measuring up to 3 cm noted throughout the abdomen. No transition identified. Findings may represent mild enteritis with associated ileus. Obstruction is less likely. There is loose stool in the proximal colon. Scattered distal colonic diverticula without active inflammatory changes. The appendix is normal. Vascular/Lymphatic: The abdominal aorta and IVC are unremarkable. No portal venous gas. There is no adenopathy. Reproductive: The uterus is slightly heterogeneous. The right ovary is unremarkable. There is a 2 cm left ovarian follicle/cyst. No follow-up. Bilateral tubal ligation clips. Other: Small fat containing umbilical hernia. Musculoskeletal: No acute or significant osseous findings. IMPRESSION: 1. Segmental narrowing of the mid gastric body with dilatation of the stomach proximally. Further evaluation with upper GI study or endoscopy recommended to exclude underlying infiltrative neoplasm or stricture. 2. Multiple fluid-filled loops of small bowel  throughout the abdomen may represent mild enteritis with associated ileus. Obstruction is less likely. 3. Colonic diverticulosis. Normal appendix. Electronically Signed   By: Elgie Collard M.D.   On: 09/03/2021 02:31        Scheduled Meds: Continuous Infusions:   LOS: 0 days    Time spent: 35 mins     Charise Killian, MD [facility] Pager [phone]  If 7PM-7AM, please contact night-coverage [email or web address]  09/03/2021, 9:36 AM

## 2021-09-03 NOTE — H&P (Signed)
Wyline Mood, MD [address], [place], [place], [address] [address], [place], [place], [address] Phone: [phone]  Fax: [phone]  Primary Care Physician:  Doreene Burke, CNM   Pre-Procedure History & Physical: HPI:  Malayla Granberry is a 46 y.o. female is here for an endoscopy    Past Medical History:  Diagnosis Date   Acne vulgaris    Epigastric pain    Gastritis    GERD (gastroesophageal reflux disease)    Gestational diabetes    Migraines    Pre-diabetes     Past Surgical History:  Procedure Laterality Date   CHOLECYSTECTOMY  1996   cyst removed     from neck- b9   DILATION AND CURETTAGE OF UTERUS     TUBAL LIGATION Bilateral 08/21/2020   Procedure: POST PARTUM TUBAL LIGATION;  Surgeon: Linzie Collin, MD;  Location: [facility] ORS;  Service: Gynecology;  Laterality: Bilateral;    Prior to Admission medications   Medication Sig Start Date End Date Taking? Authorizing Provider  acetaminophen (TYLENOL) 325 MG tablet Take 650 mg by mouth every 6 (six) hours as needed for moderate pain.   Yes [provider]  Prenatal 27-1 MG TABS TAKE 1 TABLET DAILY WHILE TRYING TO CONCEIVE, PREGNANT AND BREASTFEEDING. Patient not taking: Reported on 09/03/2021 04/01/20   [provider]    Allergies as of 09/02/2021 - Review Complete 09/02/2021  Allergen Reaction Noted   Ciprofloxacin hcl Rash 03/29/2015    Family History  Problem Relation Age of Onset   Breast cancer Maternal Aunt        55's   Diabetes Father    Diabetes Mother    Diabetes Sister    Ovarian cancer Neg Hx    Colon cancer Neg Hx    Heart disease Neg Hx     Social History   Socioeconomic History   Marital status: Married    Spouse name: Misael   Number of children: Not on file   Years of education: Not on file   Highest education level: Not on file  Occupational History   Not on file  Tobacco Use   Smoking status: Never   Smokeless tobacco: Never   Vaping Use   Vaping Use: Never used  Substance and Sexual Activity   Alcohol use: No   Drug use: No   Sexual activity: Yes    Birth control/protection: Surgical    Comment: BTL  Other Topics Concern   Not on file  Social History Narrative   Not on file   Social Determinants of Health   Financial Resource Strain: Not on file  Food Insecurity: Not on file  Transportation Needs: Not on file  Physical Activity: Not on file  Stress: Not on file  Social Connections: Not on file  Intimate Partner Violence: Not on file    Review of Systems: See HPI, otherwise negative ROS  Physical Exam: BP 96/69   Pulse 71   Temp 97.9 F (36.6 C) (Temporal)   Resp 16   Ht 5\' 4"  (1.626 m)   Wt 67.1 kg   SpO2 93%   Breastfeeding No   BMI 25.40 kg/m  General:   Alert,  pleasant and cooperative in NAD Head:  Normocephalic and atraumatic. Neck:  Supple; no masses or thyromegaly. Lungs:  Clear throughout to auscultation, normal respiratory effort.    Heart:  +S1, +S2, Regular rate and rhythm, No edema. Abdomen:  Soft, nontender and nondistended. Normal  bowel sounds, without guarding, and without rebound.   Neurologic:  Alert and  oriented x4;  grossly normal neurologically.  Impression/Plan: Macaria Bias is here for an endoscopy  to be performed for  evaluation of abnormal ct scan findings    Risks, benefits, limitations, and alternatives regarding endoscopy have been reviewed with the patient.  Questions have been answered.  All parties agreeable.   Wyline Mood, MD  09/03/2021, 2:22 PM

## 2021-09-03 NOTE — Transfer of Care (Signed)
Immediate Anesthesia Transfer of Care Note  Patient: Yesenia Davis  Procedure(s) Performed: ESOPHAGOGASTRODUODENOSCOPY (EGD) WITH PROPOFOL  Patient Location: Endoscopy Unit  Anesthesia Type:General  Level of Consciousness: awake, drowsy and patient cooperative  Airway & Oxygen Therapy: Patient Spontanous Breathing and Patient connected to face mask oxygen  Post-op Assessment: Report given to RN and Post -op Vital signs reviewed and stable  Post vital signs: Reviewed and stable  Last Vitals:  Vitals Value Taken Time  BP 101/65 09/03/21 1500  Temp    Pulse 99 09/03/21 1501  Resp 22 09/03/21 1501  SpO2 99 % 09/03/21 1501  Vitals shown include unvalidated device data.  Last Pain:  Vitals:   09/03/21 1346  TempSrc: Temporal  PainSc: 3          Complications: No notable events documented.

## 2021-09-03 NOTE — Progress Notes (Signed)
   09/03/21 2300  Clinical Encounter Type  Visited With Patient  Visit Type Initial  Referral From Nurse  Consult/Referral To Chaplain   Chaplain responded to nurse page. Chaplain provided a rosary upon patient request. Patient appreciated Chaplain visit.

## 2021-09-04 ENCOUNTER — Observation Stay: Payer: BC Managed Care – PPO

## 2021-09-04 ENCOUNTER — Encounter: Payer: Self-pay | Admitting: Gastroenterology

## 2021-09-04 DIAGNOSIS — R739 Hyperglycemia, unspecified: Secondary | ICD-10-CM

## 2021-09-04 DIAGNOSIS — R112 Nausea with vomiting, unspecified: Secondary | ICD-10-CM | POA: Diagnosis not present

## 2021-09-04 LAB — CBC
HCT: 39 % (ref 36.0–46.0)
Hemoglobin: 12.2 g/dL (ref 12.0–15.0)
MCH: 28.3 pg (ref 26.0–34.0)
MCHC: 31.3 g/dL (ref 30.0–36.0)
MCV: 90.5 fL (ref 80.0–100.0)
Platelets: 279 10*3/uL (ref 150–400)
RBC: 4.31 MIL/uL (ref 3.87–5.11)
RDW: 13.2 % (ref 11.5–15.5)
WBC: 6.5 10*3/uL (ref 4.0–10.5)
nRBC: 0 % (ref 0.0–0.2)

## 2021-09-04 LAB — BASIC METABOLIC PANEL
Anion gap: 3 — ABNORMAL LOW (ref 5–15)
BUN: 10 mg/dL (ref 6–20)
CO2: 24 mmol/L (ref 22–32)
Calcium: 7.6 mg/dL — ABNORMAL LOW (ref 8.9–10.3)
Chloride: 112 mmol/L — ABNORMAL HIGH (ref 98–111)
Creatinine, Ser: 0.56 mg/dL (ref 0.44–1.00)
GFR, Estimated: >60 mL/min (ref >60)
Glucose, Bld: 134 mg/dL — ABNORMAL HIGH (ref 70–99)
Potassium: 3.9 mmol/L (ref 3.5–5.1)
Sodium: 139 mmol/L (ref 135–145)

## 2021-09-04 MED ORDER — POLYETHYLENE GLYCOL 3350 17 G PO PACK
17.0000 g | PACK | Freq: Every day | ORAL | Status: DC
  Administered 2021-09-04: 17 g via ORAL
  Filled 2021-09-04: qty 1

## 2021-09-04 MED ORDER — DOCUSATE SODIUM 100 MG PO CAPS
200.0000 mg | ORAL_CAPSULE | Freq: Two times a day (BID) | ORAL | Status: DC
  Administered 2021-09-04: 200 mg via ORAL
  Filled 2021-09-04: qty 2

## 2021-09-04 MED ORDER — DOCUSATE SODIUM 100 MG PO CAPS: 200.0000 mg | ORAL_CAPSULE | Freq: Two times a day (BID) | ORAL | 0 refills | Status: AC | PRN

## 2021-09-04 NOTE — TOC Initial Note (Signed)
Transition of Care [facility]) - Initial/Assessment Note    Patient Details  Name: Yesenia Davis MRN: [id number] Date of Birth: 08/11/1975  Transition of Care [facility]) CM/SW Contact:    Chapman Fitch, RN Phone Number: 09/04/2021, 2:56 PM  Clinical Narrative:                   Transition of Care (TOC) Screening Note   Patient Details  Name: Yesenia Davis Date of Birth: 1975-11-19   Transition of Care [facility]) CM/SW Contact:    Chapman Fitch, RN Phone Number: 09/04/2021, 2:56 PM    Transition of Care Department [facility]) has reviewed patient and no TOC needs have been identified at this time. We will continue to monitor patient advancement through interdisciplinary progression rounds. If new patient transition needs arise, please place a TOC consult.         Patient Goals and CMS Choice        Expected Discharge Plan and Services           Expected Discharge Date: 09/04/21                                    Prior Living Arrangements/Services                       Activities of Daily Living Home Assistive Devices/Equipment: None ADL Screening (condition at time of admission) Patient's cognitive ability adequate to safely complete daily activities?: Yes Is the patient deaf or have difficulty hearing?: No Does the patient have difficulty seeing, even when wearing glasses/contacts?: No Does the patient have difficulty concentrating, remembering, or making decisions?: No Patient able to express need for assistance with ADLs?: Yes Does the patient have difficulty dressing or bathing?: No Independently performs ADLs?: Yes (appropriate for developmental age) Does the patient have difficulty walking or climbing stairs?: No Weakness of Legs: None Weakness of Arms/Hands: None  Permission Sought/Granted                  Emotional Assessment              Admission diagnosis:  Ileus (HCC) [K56.7] Generalized abdominal pain [R10.84] Nausea and  vomiting [R11.2] Nausea and vomiting, unspecified vomiting type [R11.2] Patient Active Problem List   Diagnosis Date Noted   Nausea and vomiting, possible gastric outlet obstruction 09/03/2021   History of gestational diabetes 04/08/2021   Shingles outbreak 04/25/2020   PCP:  Doreene Burke, CNM Pharmacy:   [facility] [phone] [place] [facility] - [address] [place] [address] Phone: [phone] Fax: [phone]     Social Determinants of Health (SDOH) Interventions    Readmission Risk Interventions     No data to display

## 2021-09-04 NOTE — Discharge Summary (Signed)
Physician Discharge Summary  Yesenia Davis KZS:[id number] DOB: 03-14-1975 DOA: 09/03/2021  PCP: Doreene Burke, CNM  Admit date: 09/03/2021 Discharge date: 09/04/2021  Admitted From: home Disposition: home   Recommendations for Outpatient Follow-up:  Follow up with PCP in 1-2 weeks   Home Health: no  Equipment/Devices: none  Discharge Condition: stable  CODE STATUS: full  Diet recommendation: Regular  Brief/Interim Summary: HPI was taken from Dr. Para March: Yesenia Davis is a 46 y.o. female with medical history significant for Migraines, seasonal allergies, GERD, prior cholecystectomy who presents to the ED with abdominal pain and protracted nausea.  Denies fever chills, diarrhea or dysuria.  Denies cough, chest pain or shortness of breath. ED course and data review: Tmax 99.3 with otherwise normal vitals.  CBC and CMP W NL.  Lipase and LFTs WNL, urinalysis unremarkable.  Pregnancy test negative CT abdomen and pelvis showing the following: IMPRESSION: 1. Segmental narrowing of the mid gastric body with dilatation of the stomach proximally. Further evaluation with upper GI study or endoscopy recommended to exclude underlying infiltrative neoplasm or stricture. 2. Multiple fluid-filled loops of small bowel throughout the abdomen may represent mild enteritis with associated ileus. Obstruction is less likely. 3. Colonic diverticulosis. Normal appendix.   Patient treated with Zofran.  Hospitalist consulted for admission due to concern for CT findings.    As per Dr. Mayford Knife 8/23-8/24/23: Pt presented w/ nausea and vomiting and CT abd/pelvis showed segmental narrowing of the mid gastric body w/ dilatation of the stomach proximally and possible ileus. Pt had an EGD which showed normal esophagus, normal examined duodenum, small amount of food (residue) in the stomach & no specimens were collected. Pt did have a bowel movement prior to having EGD.   Discharge Diagnoses:  Principal  Problem:   Nausea and vomiting, possible gastric outlet obstruction  Nausea and vomiting: etiology unclear, possible gastric outlet obstruction. S/p EGD which was normal and showed small amount of food (residue) in the stomach & no specimens were collected.   Zofran prn. Hx of cholecystectomy    Hyperglycemia: no hx of DM.  Discharge Instructions  Discharge Instructions     Diet general   Complete by: As directed    Discharge instructions   Complete by: As directed    F/u w/ PCP in 1 week   Increase activity slowly   Complete by: As directed       Allergies as of 09/04/2021       Reactions   Ciprofloxacin Hcl Rash        Medication List     STOP taking these medications    Prenatal 27-1 MG Tabs       TAKE these medications    acetaminophen 325 MG tablet Commonly known as: TYLENOL Take 650 mg by mouth every 6 (six) hours as needed for moderate pain.   docusate sodium 100 MG capsule Commonly known as: COLACE Take 2 capsules (200 mg total) by mouth 2 (two) times daily as needed for mild constipation or moderate constipation.        Allergies  Allergen Reactions   Ciprofloxacin Hcl Rash    Consultations: GI   Procedures/Studies: DG Abd 1 View  Result Date: 09/04/2021 CLINICAL DATA:  Ileus.  N/V. EXAM: ABDOMEN - 1 VIEW COMPARISON:  CT AP, 09/03/2021 and 09/26/2013. FINDINGS: Similar pattern of mild air-and-fluid distention of small-bowel loops. Nondilated colon. Pelvic phleboliths. No additional suspicious radio-opaque calculi. Cholecystectomy and tubal ligation clips. No acute osseous abnormality IMPRESSION: Mild distention of small bowel,  consistent with given history ileus, however early small bowel obstruction can appear similar. Electronically Signed   By: Roanna Banning M.D.   On: 09/04/2021 08:15   CT ABDOMEN PELVIS W CONTRAST  Result Date: 09/03/2021 CLINICAL DATA:  Abdominal pain, nausea and vomiting. EXAM: CT ABDOMEN AND PELVIS WITH CONTRAST  TECHNIQUE: Multidetector CT imaging of the abdomen and pelvis was performed using the standard protocol following bolus administration of intravenous contrast. RADIATION DOSE REDUCTION: This exam was performed according to the departmental dose-optimization program which includes automated exposure control, adjustment of the mA and/or kV according to patient size and/or use of iterative reconstruction technique. CONTRAST:  OMNIPAQUE IOHEXOL 350 MG/ML SOLN COMPARISON:  CT abdomen pelvis dated 09/26/2013. FINDINGS: Lower chest: Images lung bases are clear. No intra-abdominal free air or free fluid. Hepatobiliary: The liver is unremarkable. No biliary ductal dilatation. Cholecystectomy. No retained calcified stone noted in the central CBD. Pancreas: Unremarkable. No pancreatic ductal dilatation or surrounding inflammatory changes. Spleen: Normal in size without focal abnormality. Adrenals/Urinary Tract: The adrenal glands are unremarkable. The kidneys, visualized ureters, and urinary bladder appear unremarkable. Stomach/Bowel: The stomach is distended with mixture of fluid and ingested content. There is a 4.5 cm narrowed segment in the mid gastric body with distension of the stomach proximal to this segment. Although this may represent an area of contraction, and infiltrative process or stricture is a concern. Further evaluation with upper GI study or endoscopy is recommended. Multiple fluid-filled loops of small bowel measuring up to 3 cm noted throughout the abdomen. No transition identified. Findings may represent mild enteritis with associated ileus. Obstruction is less likely. There is loose stool in the proximal colon. Scattered distal colonic diverticula without active inflammatory changes. The appendix is normal. Vascular/Lymphatic: The abdominal aorta and IVC are unremarkable. No portal venous gas. There is no adenopathy. Reproductive: The uterus is slightly heterogeneous. The right ovary is  unremarkable. There is a 2 cm left ovarian follicle/cyst. No follow-up. Bilateral tubal ligation clips. Other: Small fat containing umbilical hernia. Musculoskeletal: No acute or significant osseous findings. IMPRESSION: 1. Segmental narrowing of the mid gastric body with dilatation of the stomach proximally. Further evaluation with upper GI study or endoscopy recommended to exclude underlying infiltrative neoplasm or stricture. 2. Multiple fluid-filled loops of small bowel throughout the abdomen may represent mild enteritis with associated ileus. Obstruction is less likely. 3. Colonic diverticulosis. Normal appendix. Electronically Signed   By: Elgie Collard M.D.   On: 09/03/2021 02:31   (Echo, Carotid, EGD, Colonoscopy, ERCP)    Subjective: pt denies any complaints    Discharge Exam: Vitals:   09/04/21 0357 09/04/21 0746  BP: 90/60 (!) 91/59  Pulse: 65   Resp:  20  Temp:  98.2 F (36.8 C)  SpO2:  100%   Vitals:   09/03/21 1956 09/04/21 0342 09/04/21 0357 09/04/21 0746  BP: 91/65 (!) 86/55 90/60 (!) 91/59  Pulse: 64 (!) 57 65   Resp: 20 20  20   Temp:  97.9 F (36.6 C)  98.2 F (36.8 C)  TempSrc:  Oral  Oral  SpO2: 97% 95%  100%  Weight:      Height:        General: Pt is alert, awake, not in acute distress Cardiovascular:S1/S2 +, no rubs, no gallops Respiratory: CTA bilaterally, no wheezing, no rhonchi Abdominal: Soft, NT, ND, bowel sounds + Extremities: no edema, no cyanosis    The results of significant diagnostics from this hospitalization (including imaging, microbiology, ancillary and  laboratory) are listed below for reference.     Microbiology: No results found for this or any previous visit (from the past 240 hour(s)).   Labs: BNP (last 3 results) No results for input(s): "BNP" in the last 8760 hours. Basic Metabolic Panel: Recent Labs  Lab 09/02/21 2059 09/04/21 0426  NA 135 139  K 3.8 3.9  CL 101 112*  CO2 25 24  GLUCOSE 136* 134*  BUN 16 10   CREATININE 0.62 0.56  CALCIUM 8.8* 7.6*   Liver Function Tests: Recent Labs  Lab 09/02/21 2059  AST 31  ALT 28  ALKPHOS 85  BILITOT 1.2  PROT 7.9  ALBUMIN 4.2   Recent Labs  Lab 09/02/21 2059  LIPASE 26   No results for input(s): "AMMONIA" in the last 168 hours. CBC: Recent Labs  Lab 09/02/21 2059 09/04/21 0426  WBC 8.1 6.5  HGB 14.4 12.2  HCT 45.1 39.0  MCV 88.6 90.5  PLT 297 279   Cardiac Enzymes: No results for input(s): "CKTOTAL", "CKMB", "CKMBINDEX", "TROPONINI" in the last 168 hours. BNP: Invalid input(s): "POCBNP" CBG: No results for input(s): "GLUCAP" in the last 168 hours. D-Dimer No results for input(s): "DDIMER" in the last 72 hours. Hgb A1c No results for input(s): "HGBA1C" in the last 72 hours. Lipid Profile No results for input(s): "CHOL", "HDL", "LDLCALC", "TRIG", "CHOLHDL", "LDLDIRECT" in the last 72 hours. Thyroid function studies No results for input(s): "TSH", "T4TOTAL", "T3FREE", "THYROIDAB" in the last 72 hours.  Invalid input(s): "FREET3" Anemia work up No results for input(s): "VITAMINB12", "FOLATE", "FERRITIN", "TIBC", "IRON", "RETICCTPCT" in the last 72 hours. Urinalysis    Component Value Date/Time   COLORURINE YELLOW (A) 09/02/2021 2057   APPEARANCEUR HAZY (A) 09/02/2021 2057   APPEARANCEUR Clear 02/08/2020 1125   LABSPEC 1.027 09/02/2021 2057   LABSPEC 1.030 09/26/2013 1647   PHURINE 7.0 09/02/2021 2057   GLUCOSEU NEGATIVE 09/02/2021 2057   GLUCOSEU Negative 09/26/2013 1647   HGBUR NEGATIVE 09/02/2021 2057   BILIRUBINUR NEGATIVE 09/02/2021 2057   BILIRUBINUR neg 08/12/2020 1023   BILIRUBINUR Negative 02/08/2020 1125   BILIRUBINUR Negative 09/26/2013 1647   KETONESUR NEGATIVE 09/02/2021 2057   PROTEINUR 30 (A) 09/02/2021 2057   UROBILINOGEN 0.2 08/12/2020 1023   NITRITE NEGATIVE 09/02/2021 2057   LEUKOCYTESUR NEGATIVE 09/02/2021 2057   LEUKOCYTESUR Negative 09/26/2013 1647   Sepsis Labs Recent Labs  Lab  09/02/21 2059 09/04/21 0426  WBC 8.1 6.5   Microbiology No results found for this or any previous visit (from the past 240 hour(s)).   Time coordinating discharge: Over 30 minutes  SIGNED:   Charise Killian, MD  [facility] 09/04/2021, 2:30 PM Pager   If 7PM-7AM, please contact night-coverage [email or web address]

## 2021-09-04 NOTE — Plan of Care (Signed)

## 2021-09-09 ENCOUNTER — Ambulatory Visit: Payer: BC Managed Care – PPO

## 2021-09-09 DIAGNOSIS — R278 Other lack of coordination: Secondary | ICD-10-CM | POA: Diagnosis not present

## 2021-09-09 DIAGNOSIS — M6289 Other specified disorders of muscle: Secondary | ICD-10-CM

## 2021-09-09 DIAGNOSIS — M533 Sacrococcygeal disorders, not elsewhere classified: Secondary | ICD-10-CM

## 2021-09-09 DIAGNOSIS — M6281 Muscle weakness (generalized): Secondary | ICD-10-CM

## 2021-09-09 NOTE — Therapy (Signed)
OUTPATIENT PHYSICAL THERAPY FEMALE PELVIC TREATMENT   Patient Name: Yesenia Davis MRN: [id number] DOB:02-Mar-1975, 46 y.o., female Today's Date: 09/09/2021   PT End of Session - 09/09/21 1539     Visit Number 6    Number of Visits 12    Date for PT Re-Evaluation 10/06/21    Authorization Type IE: 07/14/21    PT Start Time 1538    PT Stop Time 1618    PT Time Calculation (min) 40 min    Activity Tolerance Patient tolerated treatment well             Past Medical History:  Diagnosis Date   Acne vulgaris    Epigastric pain    Gastritis    GERD (gastroesophageal reflux disease)    Gestational diabetes    Migraines    Pre-diabetes    Past Surgical History:  Procedure Laterality Date   CHOLECYSTECTOMY  1996   cyst removed     from neck- b9   DILATION AND CURETTAGE OF UTERUS     ESOPHAGOGASTRODUODENOSCOPY (EGD) WITH PROPOFOL N/A 09/03/2021   Procedure: ESOPHAGOGASTRODUODENOSCOPY (EGD) WITH PROPOFOL;  Surgeon: Jonathon Bellows, MD;  Location: [facility] ENDOSCOPY;  Service: Gastroenterology;  Laterality: N/A;   TUBAL LIGATION Bilateral 08/21/2020   Procedure: POST PARTUM TUBAL LIGATION;  Surgeon: Harlin Heys, MD;  Location: [facility] ORS;  Service: Gynecology;  Laterality: Bilateral;   Patient Active Problem List   Diagnosis Date Noted   Nausea and vomiting, possible gastric outlet obstruction 09/03/2021   History of gestational diabetes 04/08/2021   Shingles outbreak 04/25/2020    PCP: Philip Aspen, CNM  REFERRING PROVIDER: Philip Aspen, CNM   REFERRING DIAG:  [phone] (ICD-10-CM) - Pelvic floor weakness   THERAPY DIAG:  Other lack of coordination  Sacrococcygeal disorders, not elsewhere classified  Muscle weakness (generalized)  Pelvic floor dysfunction  Rationale for Evaluation and Treatment: Rehabilitation  ONSET DATE: Over a year ago, but last 3 months worse                                                                                                                                                                                          PRECAUTIONS: None  WEIGHT BEARING RESTRICTIONS: No  FALLS:  Has patient fallen in last 6 months? No  OCCUPATION/SOCIAL ACTIVITIES: Standing job, pressing socks   PLOF: Independent   CHIEF CONCERN: Pt has tried pelvic floor PT before for lower abdominal pain and some pressure felt at the vaginal opening, but not how Pt is feeling it today. Pt has been feeling "what I think is my cervix" when I'm standing for long periods of time or walking.  It feels like a tampon is hanging and is about to come out. Pt also feels "something hanging out" when she wipes but does not get in the way of urination. Pt has had to stop dancing because she feels like whatever is there is going to come out and down to the floor. Pt said the doctor that she went to did not tell her about a prolapse and when she examined her and asked to cough, the Dr did not say anything. Pt has noticed more bulging out of an organ in a squat position. During sexual relations, Pt occasionally has pain vaginally but it is described more as pressure days after penetrative sex. When the Pt sneezes or coughs, she does have urinary leakage. During her last menstrual cycle, Pt had a very sharp pain at the vagina and it took the breath out of her. She has not had a hx of very painful menstrual periods. Pt has taken Tylenol for the pain and it does aleve the pain when it arises. Pt has had a hx of buttock/tailbone pain, and Pt did have a fall when she was a little girl on the tailbone. Pt did not have any fractures, but she is unable to sit on hard surfaces without pain.     LIVING ENVIRONMENT: Lives with: lives with their family and lives with their spouse Lives in: House/apartment   PATIENT GOALS: Pt would like to not feel the pressure at the end of the day, help with feeling in control of leakage     UROLOGICAL HISTORY Fluid intake: Yes: agua, soda every now  and then    Pain with urination: No Fully empty bladder: No Stream: stop and go intermittent, strong  Urgency: Yes Frequency: 4x at work, 6x at home Nocturia: 3x Leakage: Coughing, Sneezing, Exercise, and Intercourse Pads: Yes Type: pantyliners to heavier flow  Amount: 2x/day but does not wear everyday  Bladder control (0-10): 3/10   GASTROINTESTINAL HISTORY  Pain with bowel movement: No Type of bowel movement:Type (Bristol Stool Scale) 4 Frequency: every other day  Fully empty rectum: No Leakage: No Pads: No   SEXUAL HISTORY/FUNCTION Pain with intercourse: No Ability to have vaginal penetration: Yes   OBSTETRICAL HISTORY Vaginal deliveries: G5P4 Tearing: Yes: with 2 children Currently pregnant: No  GYNECOLOGICAL HISTORY Hysterectomy: no, tubal ligation Pelvic Organ Prolapse: None, but has sxs  Heaviness/pressure: yes   SUBJECTIVE:  Pt was recently hospitalized due to an ileus. Pt is being monitored by her PCP and will keep DPT updated. Pt has no nausea or upper gastric pain. Pt feels no pressure at vaginal opening but still has concerns about the tissue she feels at the vaginal opening.   PAIN:  Are you having pain?  Yes NPRS scale: 5/10, lower back pain    OBJECTIVE: (07/28/21)   COGNITION: Overall cognitive status: Within functional limits for tasks assessed     POSTURE:  Iliac crest height: WNL Pelvic obliquity: WNL     RANGE OF MOTION:  (Norm range in degrees)  LEFT RIGHT  Lumbar forward flexion (65):  WNL    Lumbar extension (30): WNL    Lumbar lateral flexion (25):  WNL WNL  Thoracic and Lumbar rotation (30 degrees):    WNL WNL  Hip Flexion (0-125):   WNL WNL  Hip IR (0-45):  WNL WNL  Hip ER (0-45):  WNL WNL  Hip Adduction:   WNL WNL  Hip Abduction (0-40):  WNL WNL  Hip extension (0-15):  WNL  WNL  (*= pain, Blank rows = not tested)   STRENGTH: MMT    RLE  LLE  Hip Flexion 4 4  Hip Extension 4 4  Hip Abduction  5 5  Hip Adduction      Hip ER  5 5  Hip IR  5 5  Knee Extension 5 5  Knee Flexion 4 4  Dorsiflexion     Plantarflexion (seated) 5 5  (*= pain, Blank rows = not tested)   SPECIAL TESTS:   FABER (SN 81): negative B FADIR (SN 94): negative B   PALPATION:  Abdominal:  Diastasis: none   Rib flare: present B RLQ: tenderness upon palpation more than the LLQ   L coccyx bony prominence - tender upon palpation - surrounding musculature (gluteals/PFM) R coccyx bony prominence - tender upon palpation   L4-S1 - hypomobile  SIJ bilaterally - hypomobile with increased pain   Coccyx can move into slight extension when asked to sustain IAP increase "bear down" with no pain    EXTERNAL PELVIC EXAM: Patient educated on the purpose of the pelvic exam and articulated understanding; patient consented to the exam verbally. Deferred 2/2 time constraints  Breath coordination: present but inconsistent  Voluntary Contraction: present, 1/5 MMT Relaxation: delayed  Perineal movement with sustained IAP increase ("bear down"): descent Perineal movement with rapid IAP increase ("cough"): no change  Pubic symphysis: no tenderness  (0= no contraction, 1= flicker, 2= weak squeeze, 3= fair squeeze with lift, 4= good squeeze and lift against resistance, 5= strong squeeze against strong resistance)    TODAY'S TREATMENT:  Neuromuscular Re-education: Lifting techniques and body mechanics - squatting and semi lunge stance with simulated bed height to crib  Practice lifting mechanics from ground with weighted ball  All the above with coordinated breath for improved IAP management   Supine hooklying diaphragmatic breathing with VCs and TCs for downregulation of the nervous system and improved management of IAP  [facility] abdominal rehab   Supine hooklying TrA contraction with coordinated exhale   Supine hooklying TrA w/marches, significant cueing for coordination and proper technique   Standing modified plank with mini squat  and coordinated breath for improved IAP management   Patient response to interventions: Pt felt her TrA tighten stronger with LE challenge   Patient Education:  Patient provided with HEP: TrA activation with march, standing modified plank with breath. Patient educated throughout session on appropriate technique and form using multi-modal cueing, HEP, and activity modification. Patient will continue to benefit from further education in order to maximize compliance and understanding for long-term therapeutic gains.    ASSESSMENT:  Clinical Impression: Patient presents to clinic with excellent motivation to participate in today's session. Pt continues to demonstrate deficits in IAP management, PFM coordination, PFM strength, PFM endurance, LE strength, posture and pain. Pt arrived to clinic with increased lower back pain, 5/10. Pt required moderate VCs and TCs for proper technique and body mechanics during progression of TrA activation in various positions. Discussion and practice of lifting techniques and proper body mechanics with coordinated breath to aid in pain modulation (lower back) and decrease pressure felt at the vaginal opening. Pt responded well to active and educational interventions. Patient will continue to benefit from skilled therapeutic intervention to address deficits in IAP management, PFM coordination, PFM strength, PFM endurance, LE strength, posture and pain in order to increase PLOF and improve overall QOL.    Objective Impairments: decreased activity tolerance, decreased coordination, decreased endurance, decreased strength, improper body mechanics, postural  dysfunction, and pain.   Activity Limitations: carrying, lifting, sitting, standing, transfers, continence, toileting, locomotion level, and caring for others  Personal Factors: Age, Past/current experiences, Time since onset of injury/illness/exacerbation, and 1-2 comorbidities: prediabetic and migraines  are also  affecting patient's functional outcome.   Rehab Potential: Good  Clinical Decision Making: Evolving/moderate complexity  Evaluation Complexity: Moderate   GOALS: Goals reviewed with patient? Yes  SHORT TERM GOALS: Target date: 10/21/2021  Patient will decrease worst tailbone pain as reported on NPRS by at least 2 points to demonstrate clinically significant reduction in pain in order to restore/improve function and overall QOL. Baseline: 9/10 Goal status: INITIAL   LONG TERM GOALS: Target date: 12/02/2021   Patient will score  >/= 59 and 62 on FOTO Bowel Constipation/Urinary Problem in order to demonstrate improved PFM coordination, IAP management  and overall improved QOL.  Baseline: both 52 Goal status: INITIAL  2.  Patient will report confidence in ability to control bladder > 6/10 in order to demonstrate improved function and ability to participate more fully in activities at home and in the community. Baseline: 3/10 Goal status: INITIAL  3.  Patient will report less than 5 incidents of stress urinary incontinence over the course of 3 weeks while coughing/sneezing/physical activity/sexual activity in order to demonstrate improved PFM coordination, strength, and function for improved overall QOL. Baseline: leakage occurs every time with increased IAP Goal status: INITIAL  4.  Patient will be able to articulate decreased pressure at the vaginal opening and demonstrate 3-5 postures that encourage gravity assisted repositioning of pelvic organs to decrease discomfort and pressure at end of day in order to participate more fully in activities at home and in the community.  Baseline: 8/10 pressure at the end of the day/feeling of "something" falling Goal status: INITIAL  5.  Patient will demonstrate circumferential and sequential contraction of >4/5 MMT, > 6 sec hold x10 and 5 consecutive quick flicks with </= 10 min rest between testing bouts, and relaxation of the PFM coordinated  with breath for improved management of intra-abdominal pressure and normal bowel and bladder function without the presence of pain nor incontinence in order to improve participation at home and in the community. Baseline: will assess next visit, 1/5 MMT/no endurance tested (07/28/21) Goal status: INITIAL    PLAN: PT Frequency: 1x/week  PT Duration: 12 weeks  Planned Interventions: Therapeutic exercises, Therapeutic activity, Neuromuscular re-education, Balance training, Gait training, Patient/Family education, Joint mobilization, Spinal mobilization, Cryotherapy, Moist heat, Taping, and Manual therapy  Plan For Next Session:  FOTO, continue deep core, tailbone?    [employer], PT, DPT  09/09/2021, 3:42 PM

## 2021-09-17 ENCOUNTER — Ambulatory Visit: Payer: BC Managed Care – PPO | Attending: Certified Nurse Midwife

## 2021-09-17 DIAGNOSIS — M533 Sacrococcygeal disorders, not elsewhere classified: Secondary | ICD-10-CM | POA: Insufficient documentation

## 2021-09-17 DIAGNOSIS — R278 Other lack of coordination: Secondary | ICD-10-CM | POA: Insufficient documentation

## 2021-09-17 DIAGNOSIS — M6289 Other specified disorders of muscle: Secondary | ICD-10-CM | POA: Diagnosis present

## 2021-09-17 DIAGNOSIS — M6281 Muscle weakness (generalized): Secondary | ICD-10-CM | POA: Diagnosis present

## 2021-09-17 NOTE — Therapy (Signed)
OUTPATIENT PHYSICAL THERAPY FEMALE PELVIC TREATMENT   Patient Name: Yesenia Davis MRN: [id number] DOB:Jan 01, 1976, 46 y.o., female Today's Date: 09/17/2021   PT End of Session - 09/17/21 1529     Visit Number 7    Number of Visits 12    Date for PT Re-Evaluation 10/06/21    Authorization Type IE: 07/14/21    PT Start Time 1530    PT Stop Time 1610    PT Time Calculation (min) 40 min    Activity Tolerance Patient tolerated treatment well             Past Medical History:  Diagnosis Date   Acne vulgaris    Epigastric pain    Gastritis    GERD (gastroesophageal reflux disease)    Gestational diabetes    Migraines    Pre-diabetes    Past Surgical History:  Procedure Laterality Date   CHOLECYSTECTOMY  1996   cyst removed     from neck- b9   DILATION AND CURETTAGE OF UTERUS     ESOPHAGOGASTRODUODENOSCOPY (EGD) WITH PROPOFOL N/A 09/03/2021   Procedure: ESOPHAGOGASTRODUODENOSCOPY (EGD) WITH PROPOFOL;  Surgeon: Wyline Mood, MD;  Location: [facility];  Service: Gastroenterology;  Laterality: N/A;   TUBAL LIGATION Bilateral 08/21/2020   Procedure: POST PARTUM TUBAL LIGATION;  Surgeon: Linzie Collin, MD;  Location: [facility] ORS;  Service: Gynecology;  Laterality: Bilateral;   Patient Active Problem List   Diagnosis Date Noted   Nausea and vomiting, possible gastric outlet obstruction 09/03/2021   History of gestational diabetes 04/08/2021   Shingles outbreak 04/25/2020    PCP: Doreene Burke, CNM  REFERRING PROVIDER: Doreene Burke, CNM   REFERRING DIAG:  [phone] (ICD-10-CM) - Pelvic floor weakness   THERAPY DIAG:  Other lack of coordination  Sacrococcygeal disorders, not elsewhere classified  Muscle weakness (generalized)  Pelvic floor dysfunction  Rationale for Evaluation and Treatment: Rehabilitation  ONSET DATE: Over a year ago, but last 3 months worse                                                                                                                                                                                          PRECAUTIONS: None  WEIGHT BEARING RESTRICTIONS: No  FALLS:  Has patient fallen in last 6 months? No  OCCUPATION/SOCIAL ACTIVITIES: Standing job, pressing socks   PLOF: Independent   CHIEF CONCERN: Pt has tried pelvic floor PT before for lower abdominal pain and some pressure felt at the vaginal opening, but not how Pt is feeling it today. Pt has been feeling "what I think is my cervix" when I'm standing for long periods of time or walking.  It feels like a tampon is hanging and is about to come out. Pt also feels "something hanging out" when she wipes but does not get in the way of urination. Pt has had to stop dancing because she feels like whatever is there is going to come out and down to the floor. Pt said the doctor that she went to did not tell her about a prolapse and when she examined her and asked to cough, the Dr did not say anything. Pt has noticed more bulging out of an organ in a squat position. During sexual relations, Pt occasionally has pain vaginally but it is described more as pressure days after penetrative sex. When the Pt sneezes or coughs, she does have urinary leakage. During her last menstrual cycle, Pt had a very sharp pain at the vagina and it took the breath out of her. She has not had a hx of very painful menstrual periods. Pt has taken Tylenol for the pain and it does aleve the pain when it arises. Pt has had a hx of buttock/tailbone pain, and Pt did have a fall when she was a little girl on the tailbone. Pt did not have any fractures, but she is unable to sit on hard surfaces without pain.     LIVING ENVIRONMENT: Lives with: lives with their family and lives with their spouse Lives in: House/apartment   PATIENT GOALS: Pt would like to not feel the pressure at the end of the day, help with feeling in control of leakage     UROLOGICAL HISTORY Fluid intake: Yes: agua, soda every now  and then    Pain with urination: No Fully empty bladder: No Stream: stop and go intermittent, strong  Urgency: Yes Frequency: 4x at work, 6x at home Nocturia: 3x Leakage: Coughing, Sneezing, Exercise, and Intercourse Pads: Yes Type: pantyliners to heavier flow  Amount: 2x/day but does not wear everyday  Bladder control (0-10): 3/10   GASTROINTESTINAL HISTORY  Pain with bowel movement: No Type of bowel movement:Type (Bristol Stool Scale) 4 Frequency: every other day  Fully empty rectum: No Leakage: No Pads: No   SEXUAL HISTORY/FUNCTION Pain with intercourse: No Ability to have vaginal penetration: Yes   OBSTETRICAL HISTORY Vaginal deliveries: G5P4 Tearing: Yes: with 2 children Currently pregnant: No  GYNECOLOGICAL HISTORY Hysterectomy: no, tubal ligation Pelvic Organ Prolapse: None, but has sxs  Heaviness/pressure: yes   SUBJECTIVE:  Pt feels like she is having an off week because she can feel the bulge more at the vaginal opening than before. She has been carrying her granddaughter more and having to care for her youngest child because her husband hurt his back.    PAIN:  Are you having pain? Yes NPRS scale: LLQ, 5/10   OBJECTIVE: (07/28/21)   COGNITION: Overall cognitive status: Within functional limits for tasks assessed     POSTURE:  Iliac crest height: WNL Pelvic obliquity: WNL     RANGE OF MOTION:  (Norm range in degrees)  LEFT RIGHT  Lumbar forward flexion (65):  WNL    Lumbar extension (30): WNL    Lumbar lateral flexion (25):  WNL WNL  Thoracic and Lumbar rotation (30 degrees):    WNL WNL  Hip Flexion (0-125):   WNL WNL  Hip IR (0-45):  WNL WNL  Hip ER (0-45):  WNL WNL  Hip Adduction:   WNL WNL  Hip Abduction (0-40):  WNL WNL  Hip extension (0-15):  WNL WNL  (*= pain, Blank rows = not  tested)   STRENGTH: MMT    RLE  LLE  Hip Flexion 4 4  Hip Extension 4 4  Hip Abduction  5 5  Hip Adduction     Hip ER  5 5  Hip IR  5 5  Knee  Extension 5 5  Knee Flexion 4 4  Dorsiflexion     Plantarflexion (seated) 5 5  (*= pain, Blank rows = not tested)   SPECIAL TESTS:   FABER (SN 81): negative B FADIR (SN 94): negative B   PALPATION:  Abdominal:  Diastasis: none   Rib flare: present B RLQ: tenderness upon palpation more than the LLQ   L coccyx bony prominence - tender upon palpation - surrounding musculature (gluteals/PFM) R coccyx bony prominence - tender upon palpation   L4-S1 - hypomobile  SIJ bilaterally - hypomobile with increased pain   Coccyx can move into slight extension when asked to sustain IAP increase "bear down" with no pain    EXTERNAL PELVIC EXAM: Patient educated on the purpose of the pelvic exam and articulated understanding; patient consented to the exam verbally. Deferred 2/2 time constraints  Breath coordination: present but inconsistent  Voluntary Contraction: present, 1/5 MMT Relaxation: delayed  Perineal movement with sustained IAP increase ("bear down"): descent Perineal movement with rapid IAP increase ("cough"): no change  Pubic symphysis: no tenderness  (0= no contraction, 1= flicker, 2= weak squeeze, 3= fair squeeze with lift, 4= good squeeze and lift against resistance, 5= strong squeeze against strong resistance)    TODAY'S TREATMENT:  Neuromuscular Re-education: Reassessment of FOTO: IE: FOTO Bowel Constipation - 52, FOTO Urinary Problem - 52  Today: Bowel Constipation - 55, Urinary Problem - 57 Newly intermittent lower quadrant pain - PFDI Pain increase   Discussion on importance of practicing TrA contraction with coordinated breath and adding challenge of LE for improved IAP management that will aid in decreasing heaviness/pressure felt at vaginal opening  Review of TrA in supine as part of HEP. Printed out HEP.    Patient response to interventions: Pt feels like her lower abdominal pain is something new/worse since IE   Patient Education:  Patient provided with  HEP: no change. Patient educated throughout session on appropriate technique and form using multi-modal cueing, HEP, and activity modification. Patient will continue to benefit from further education in order to maximize compliance and understanding for long-term therapeutic gains.    ASSESSMENT:  Clinical Impression: Patient presents to clinic with excellent motivation to participate in today's session. Pt continues to demonstrate deficits in IAP management, PFM coordination, PFM strength, PFM endurance, LE strength, posture and pain. Pt arrived to clinic with increased L lower quadrant pain, 5/10 which she reports feels like a recently newer symptom. Pt also reports increased heaviness/pressure felt at the vaginal opening this past week. Upon reassessment of FOTO, Pt is progressing towards LTGs with a 5 point increase in both Urinary Problem and Bowel Constipation; however, Pt reports increased pelvic pain which is demonstrated as a higher score than IE. Discussion on the importance of practicing TrA activation as part of HEP in order to build strength at the deep core to minimize symptoms of heaviness/pressure. Brief review of HEP and will continue to assess abdomen pain next session. Pt responded well to active and educational interventions. Patient will continue to benefit from skilled therapeutic intervention to address deficits in IAP management, PFM coordination, PFM strength, PFM endurance, LE strength, posture and pain in order to increase PLOF and improve overall QOL.  Objective Impairments: decreased activity tolerance, decreased coordination, decreased endurance, decreased strength, improper body mechanics, postural dysfunction, and pain.   Activity Limitations: carrying, lifting, sitting, standing, transfers, continence, toileting, locomotion level, and caring for others  Personal Factors: Age, Past/current experiences, Time since onset of injury/illness/exacerbation, and 1-2  comorbidities: prediabetic and migraines  are also affecting patient's functional outcome.   Rehab Potential: Good  Clinical Decision Making: Evolving/moderate complexity  Evaluation Complexity: Moderate   GOALS: Goals reviewed with patient? Yes  SHORT TERM GOALS: Target date: 10/06/21  Patient will decrease worst tailbone pain as reported on NPRS by at least 2 points to demonstrate clinically significant reduction in pain in order to restore/improve function and overall QOL. Baseline: 9/10 Goal status: INITIAL   LONG TERM GOALS: Target date: 10/06/2021   Patient will score  >/= 59 and 62 on FOTO Bowel Constipation/Urinary Problem in order to demonstrate improved PFM coordination, IAP management  and overall improved QOL.  Baseline: both 52 Goal status: INITIAL  2.  Patient will report confidence in ability to control bladder > 6/10 in order to demonstrate improved function and ability to participate more fully in activities at home and in the community. Baseline: 3/10 Goal status: INITIAL  3.  Patient will report less than 5 incidents of stress urinary incontinence over the course of 3 weeks while coughing/sneezing/physical activity/sexual activity in order to demonstrate improved PFM coordination, strength, and function for improved overall QOL. Baseline: leakage occurs every time with increased IAP Goal status: INITIAL  4.  Patient will be able to articulate decreased pressure at the vaginal opening and demonstrate 3-5 postures that encourage gravity assisted repositioning of pelvic organs to decrease discomfort and pressure at end of day in order to participate more fully in activities at home and in the community.  Baseline: 8/10 pressure at the end of the day/feeling of "something" falling Goal status: INITIAL  5.  Patient will demonstrate circumferential and sequential contraction of >4/5 MMT, > 6 sec hold x10 and 5 consecutive quick flicks with </= 10 min rest between  testing bouts, and relaxation of the PFM coordinated with breath for improved management of intra-abdominal pressure and normal bowel and bladder function without the presence of pain nor incontinence in order to improve participation at home and in the community. Baseline: will assess next visit, 1/5 MMT/no endurance tested (07/28/21) Goal status: INITIAL    PLAN: PT Frequency: 1x/week  PT Duration: 12 weeks  Planned Interventions: Therapeutic exercises, Therapeutic activity, Neuromuscular re-education, Balance training, Gait training, Patient/Family education, Joint mobilization, Spinal mobilization, Cryotherapy, Moist heat, Taping, and Manual therapy  Plan For Next Session:  assess/manual at abdomen, continue deep core, review if needed, how is tailbone?   [employer], PT, DPT  09/17/2021, 3:31 PM

## 2021-09-30 ENCOUNTER — Ambulatory Visit: Payer: BC Managed Care – PPO

## 2021-10-01 ENCOUNTER — Ambulatory Visit: Payer: BC Managed Care – PPO

## 2021-10-09 ENCOUNTER — Ambulatory Visit: Payer: BC Managed Care – PPO

## 2021-10-09 DIAGNOSIS — R278 Other lack of coordination: Secondary | ICD-10-CM | POA: Diagnosis not present

## 2021-10-09 DIAGNOSIS — M6289 Other specified disorders of muscle: Secondary | ICD-10-CM

## 2021-10-09 DIAGNOSIS — M6281 Muscle weakness (generalized): Secondary | ICD-10-CM

## 2021-10-09 DIAGNOSIS — M533 Sacrococcygeal disorders, not elsewhere classified: Secondary | ICD-10-CM

## 2021-10-09 NOTE — Therapy (Signed)
OUTPATIENT PHYSICAL THERAPY FEMALE PELVIC RE-EVAL    Patient Name: Yesenia Davis MRN: [id number] DOB:January 07, 1976, 46 y.o., female Today's Date: 10/09/2021   PT End of Session - 10/09/21 1554     Visit Number 8    Number of Visits 18    Date for PT Re-Evaluation 12/18/21    Authorization Type IE: 07/14/21; RC: 10/09/21    PT Start Time 1555    PT Stop Time 1635    PT Time Calculation (min) 40 min    Activity Tolerance Patient tolerated treatment well             Past Medical History:  Diagnosis Date   Acne vulgaris    Epigastric pain    Gastritis    GERD (gastroesophageal reflux disease)    Gestational diabetes    Migraines    Pre-diabetes    Past Surgical History:  Procedure Laterality Date   CHOLECYSTECTOMY  1996   cyst removed     from neck- b9   DILATION AND CURETTAGE OF UTERUS     ESOPHAGOGASTRODUODENOSCOPY (EGD) WITH PROPOFOL N/A 09/03/2021   Procedure: ESOPHAGOGASTRODUODENOSCOPY (EGD) WITH PROPOFOL;  Surgeon: Jonathon Bellows, MD;  Location: [facility] ENDOSCOPY;  Service: Gastroenterology;  Laterality: N/A;   TUBAL LIGATION Bilateral 08/21/2020   Procedure: POST PARTUM TUBAL LIGATION;  Surgeon: Harlin Heys, MD;  Location: [facility] ORS;  Service: Gynecology;  Laterality: Bilateral;   Patient Active Problem List   Diagnosis Date Noted   Nausea and vomiting, possible gastric outlet obstruction 09/03/2021   History of gestational diabetes 04/08/2021   Shingles outbreak 04/25/2020    PCP: Philip Aspen, CNM  REFERRING PROVIDER: Philip Aspen, CNM   REFERRING DIAG:  [phone] (ICD-10-CM) - Pelvic floor weakness   THERAPY DIAG:  Other lack of coordination  Sacrococcygeal disorders, not elsewhere classified  Muscle weakness (generalized)  Pelvic floor dysfunction  Rationale for Evaluation and Treatment: Rehabilitation  ONSET DATE: Over a year ago, but last 3 months worse                                                                                                                                                                                          PRECAUTIONS: None  WEIGHT BEARING RESTRICTIONS: No  FALLS:  Has patient fallen in last 6 months? No  OCCUPATION/SOCIAL ACTIVITIES: Standing job, pressing socks   PLOF: Independent   CHIEF CONCERN: Pt has tried pelvic floor PT before for lower abdominal pain and some pressure felt at the vaginal opening, but not how Pt is feeling it today. Pt has been feeling "what I think is my cervix" when I'm standing for long periods of  time or walking. It feels like a tampon is hanging and is about to come out. Pt also feels "something hanging out" when she wipes but does not get in the way of urination. Pt has had to stop dancing because she feels like whatever is there is going to come out and down to the floor. Pt said the doctor that she went to did not tell her about a prolapse and when she examined her and asked to cough, the Dr did not say anything. Pt has noticed more bulging out of an organ in a squat position. During sexual relations, Pt occasionally has pain vaginally but it is described more as pressure days after penetrative sex. When the Pt sneezes or coughs, she does have urinary leakage. During her last menstrual cycle, Pt had a very sharp pain at the vagina and it took the breath out of her. She has not had a hx of very painful menstrual periods. Pt has taken Tylenol for the pain and it does aleve the pain when it arises. Pt has had a hx of buttock/tailbone pain, and Pt did have a fall when she was a little girl on the tailbone. Pt did not have any fractures, but she is unable to sit on hard surfaces without pain.     LIVING ENVIRONMENT: Lives with: lives with their family and lives with their spouse Lives in: House/apartment   PATIENT GOALS: Pt would like to not feel the pressure at the end of the day, help with feeling in control of leakage     UROLOGICAL HISTORY Fluid intake: Yes: agua,  soda every now and then    Pain with urination: No Fully empty bladder: No Stream: stop and go intermittent, strong  Urgency: Yes Frequency: 4x at work, 6x at home Nocturia: 3x Leakage: Coughing, Sneezing, Exercise, and Intercourse Pads: Yes Type: pantyliners to heavier flow  Amount: 2x/day but does not wear everyday  Bladder control (0-10): 3/10   GASTROINTESTINAL HISTORY  Pain with bowel movement: No Type of bowel movement:Type (Bristol Stool Scale) 4 Frequency: every other day  Fully empty rectum: No Leakage: No Pads: No   SEXUAL HISTORY/FUNCTION Pain with intercourse: No Ability to have vaginal penetration: Yes   OBSTETRICAL HISTORY Vaginal deliveries: G5P4 Tearing: Yes: with 2 children Currently pregnant: No  GYNECOLOGICAL HISTORY Hysterectomy: no, tubal ligation Pelvic Organ Prolapse: None, but has sxs  Heaviness/pressure: yes   SUBJECTIVE:  Pt is having a bit more in the lower abdominals but right now may be due to menstrual cycle. Pt has not had much urinary leakage. Pt has noticed vaginal tissue protruding more with certain tasks like lifting the babies.    PAIN:  Are you having pain? Yes NPRS scale: LLQ, 5/10   OBJECTIVE: (07/28/21)   COGNITION: Overall cognitive status: Within functional limits for tasks assessed     POSTURE:  Iliac crest height: WNL Pelvic obliquity: WNL     RANGE OF MOTION:  (Norm range in degrees)  LEFT RIGHT  Lumbar forward flexion (65):  WNL    Lumbar extension (30): WNL    Lumbar lateral flexion (25):  WNL WNL  Thoracic and Lumbar rotation (30 degrees):    WNL WNL  Hip Flexion (0-125):   WNL WNL  Hip IR (0-45):  WNL WNL  Hip ER (0-45):  WNL WNL  Hip Adduction:   WNL WNL  Hip Abduction (0-40):  WNL WNL  Hip extension (0-15):  WNL WNL  (*= pain, Blank rows = not  tested)   STRENGTH: MMT    RLE  LLE  Hip Flexion 4 4  Hip Extension 4 4  Hip Abduction  5 5  Hip Adduction     Hip ER  5 5  Hip IR  5 5  Knee  Extension 5 5  Knee Flexion 4 4  Dorsiflexion     Plantarflexion (seated) 5 5  (*= pain, Blank rows = not tested)   SPECIAL TESTS:   FABER (SN 81): negative B FADIR (SN 94): negative B   PALPATION:  Abdominal:  Diastasis: none   Rib flare: present B RLQ: tenderness upon palpation more than the LLQ   L coccyx bony prominence - tender upon palpation - surrounding musculature (gluteals/PFM) R coccyx bony prominence - tender upon palpation   L4-S1 - hypomobile  SIJ bilaterally - hypomobile with increased pain   Coccyx can move into slight extension when asked to sustain IAP increase "bear down" with no pain    EXTERNAL PELVIC EXAM: Patient educated on the purpose of the pelvic exam and articulated understanding; patient consented to the exam verbally.  Breath coordination: present but inconsistent  Voluntary Contraction: present, 1/5 MMT Relaxation: delayed  Perineal movement with sustained IAP increase ("bear down"): descent Perineal movement with rapid IAP increase ("cough"): no change  Pubic symphysis: no tenderness  (0= no contraction, 1= flicker, 2= weak squeeze, 3= fair squeeze with lift, 4= good squeeze and lift against resistance, 5= strong squeeze against strong resistance)    TODAY'S TREATMENT:  Neuromuscular Re-education: Reassessment of FOTO on 09/17/21 IE: FOTO Bowel Constipation - 52, FOTO Urinary Problem - 52  Today: Bowel Constipation - 55, Urinary Problem - 57 Newly intermittent lower quadrant pain - PFDI Pain increase   Discussion and Reassessment of LTGs and STGs below: -Pt has MET 4/6 goals established since IE  Discussion on second opinion with GYN in [facility] as Pt is concerned about the vaginal tissue and the increased pressure and heaviness she is feeling at the vaginal opening. Discussed that DPT cannot grade nor diagnosis a prolapse; however, Pt is having sxs of a prolapse. DPT mentioned options that may be discussed in her visit with the second  opinion in [facility] or with Referring Provider: pessary and/or surgical options and continuing with PFPT. Pt verbalized understanding.   Pt would like for DPT to contact Referring Provider Philip Aspen, CNM) to mention the sxs she has been experiencing and requesting a medical confirmation as diagnosing a prolapse is outside of pelvic physical therapy scope of practice.   Discussion on continuing compliance with HEP  to continue strengthening deep core and improving IAP management.   Patient response to interventions: Pt does appreciate the progression she has made so far in PFPT   Patient Education:  Patient provided with HEP: no change. Patient educated throughout session on appropriate technique and form using multi-modal cueing, HEP, and activity modification. Patient will continue to benefit from further education in order to maximize compliance and understanding for long-term therapeutic gains.    ASSESSMENT:  Clinical Impression: Patient presents to clinic with excellent motivation to participate in today's reassessment. Based on FOTO Reassessment last session in Bowel Constipation and Urinary Leakage, Pt demonstrates minimal improvement (3-5 points). Pt has improved significantly with urinary leakage (2 episodes of stress UI over the course of 3 weeks), improved bladder control (7/10) compared to IE (3/10), improved worst tailbone pain (6/10) compared to IE (9/10 and inability to sit on hard surfaces for long  periods of time), and improved PFM coordination and IAP management as Pt does not have as much vaginal pressure/heaviness. Pt voiced concern on these continuing sxs and vaginal tissue at introitus. DPT discussed that diagnosing a prolapse is outside of scope and requires a physicians confirmation. Pt verbalized understanding and will be contacting her CNM but would also like DPT to contact CNM to mention concern of sxs (pelvic pain, increased heaviness/pressure) to receive a  medical confirmation. Although Pt demonstrates improvement since initial evaluation, patient will continue to benefit from skilled therapeutic intervention to address deficits in IAP management, PFM coordination, PFM strength, PFM endurance, LE strength, posture and pain in order to increase PLOF and improve overall QOL.    Objective Impairments: decreased activity tolerance, decreased coordination, decreased endurance, decreased strength, improper body mechanics, postural dysfunction, and pain.   Activity Limitations: carrying, lifting, sitting, standing, transfers, continence, toileting, locomotion level, and caring for others  Personal Factors: Age, Past/current experiences, Time since onset of injury/illness/exacerbation, and 1-2 comorbidities: prediabetic and migraines  are also affecting patient's functional outcome.   Rehab Potential: Good  Clinical Decision Making: Evolving/moderate complexity  Evaluation Complexity: Moderate   GOALS: Goals reviewed with patient? Yes  SHORT TERM GOALS: Target date: 10/06/21  Patient will decrease worst tailbone pain as reported on NPRS by at least 2 points to demonstrate clinically significant reduction in pain in order to restore/improve function and overall QOL. Baseline: 9/10; (9/28): 6/10  Goal status: MET   LONG TERM GOALS: Target date: 12/18/21  Patient will score  >/= 59 and 62 on FOTO Bowel Constipation/Urinary Problem in order to demonstrate improved PFM coordination, IAP management  and overall improved QOL.  Baseline: both 52, (9/6): 55 and 57  Goal status: IN PROGRESS  2.  Patient will report confidence in ability to control bladder > 6/10 in order to demonstrate improved function and ability to participate more fully in activities at home and in the community. Baseline: 3/10; (9/28): 7/10 Goal status: MET  3.  Patient will report less than 5 incidents of stress urinary incontinence over the course of 3 weeks while  coughing/sneezing/physical activity/sexual activity in order to demonstrate improved PFM coordination, strength, and function for improved overall QOL. Baseline: leakage occurs every time with increased IAP; (9/28): 2x over past 3 weeks with sneezing Goal status: MET  4.  Patient will be able to articulate decreased pressure at the vaginal opening and demonstrate 3-5 postures that encourage gravity assisted repositioning of pelvic organs to decrease discomfort and pressure at end of day in order to participate more fully in activities at home and in the community.  Baseline: 8/10 pressure at the end of the day/feeling of "something" falling; (9/28): 5/10 and does not feel like "something" is dropping as much, has been better Goal status: MET  5.  Patient will demonstrate circumferential and sequential contraction of >4/5 MMT, > 6 sec hold x10 and 5 consecutive quick flicks with </= 10 min rest between testing bouts, and relaxation of the PFM coordinated with breath for improved management of intra-abdominal pressure and normal bowel and bladder function without the presence of pain nor incontinence in order to improve participation at home and in the community. Baseline: will assess next visit, 1/5 MMT/no endurance tested (07/28/21) Goal status: IN PROGRESS    PLAN: PT Frequency: 1x/week  PT Duration: 12 weeks  Planned Interventions: Therapeutic exercises, Therapeutic activity, Neuromuscular re-education, Balance training, Gait training, Patient/Family education, Joint mobilization, Spinal mobilization, Cryotherapy, Moist heat, Taping, and Manual  therapy  Plan For Next Session:  deep core in standing, PFM internal assess in standing, how is tailbone?   [employer], PT, DPT  10/09/2021, 4:52 PM

## 2021-10-22 ENCOUNTER — Ambulatory Visit: Payer: BC Managed Care – PPO

## 2021-10-23 ENCOUNTER — Encounter: Payer: Self-pay | Admitting: Certified Nurse Midwife

## 2021-10-23 ENCOUNTER — Ambulatory Visit (INDEPENDENT_AMBULATORY_CARE_PROVIDER_SITE_OTHER): Payer: BC Managed Care – PPO | Admitting: Certified Nurse Midwife

## 2021-10-23 VITALS — BP 113/79 | HR 72 | Wt 149.0 lb

## 2021-10-23 DIAGNOSIS — N814 Uterovaginal prolapse, unspecified: Secondary | ICD-10-CM

## 2021-10-23 DIAGNOSIS — Z8742 Personal history of other diseases of the female genital tract: Secondary | ICD-10-CM

## 2021-10-23 DIAGNOSIS — R102 Pelvic and perineal pain: Secondary | ICD-10-CM | POA: Diagnosis not present

## 2021-10-23 DIAGNOSIS — N941 Unspecified dyspareunia: Secondary | ICD-10-CM | POA: Insufficient documentation

## 2021-10-23 NOTE — Progress Notes (Signed)
GYN ENCOUNTER NOTE  Subjective:       Yesenia Davis is a 46 y.o. [id number] female is here for gynecologic evaluation of the following issues:  1. Bilateral ovarian pain, pain with intercourse. PT has history of ovarian cysts and uterine prolapse ,  stage 2 that  she is seeing PT for treatment.      Gynecologic History Patient's last menstrual period was 10/02/2021 (approximate). Contraception: tubal ligation Last Pap: 02/09/2018. Results were: normal Last mammogram: 06/05/2021. Results were: normal  Obstetric History OB History  Gravida Para Term Preterm AB Living  5 4 4   1 4   SAB IAB Ectopic Multiple Live Births  1     0 4    # Outcome Date GA Lbr Len/2nd Weight Sex Delivery Anes PTL Lv  5 Term 08/20/20 [redacted]w[redacted]d 05:56 / 00:01 7 lb 4.4 oz (3.3 kg) M Vag-Spont None  LIV  4 Term 10/18/15 [redacted]w[redacted]d / 00:06 6 lb 14.1 oz (3.12 kg) M Vag-Spont None  LIV  3 Term 2005   7 lb 1.8 oz (3.225 kg) M Vag-Spont   LIV  2 SAB 2003          1 Term 1996   7 lb 3.2 oz (3.266 kg) F Vag-Spont   LIV    Past Medical History:  Diagnosis Date   Acne vulgaris    Epigastric pain    Gastritis    GERD (gastroesophageal reflux disease)    Gestational diabetes    Migraines    Pre-diabetes     Past Surgical History:  Procedure Laterality Date   CHOLECYSTECTOMY  1996   cyst removed     from neck- b9   DILATION AND CURETTAGE OF UTERUS     ESOPHAGOGASTRODUODENOSCOPY (EGD) WITH PROPOFOL N/A 09/03/2021   Procedure: ESOPHAGOGASTRODUODENOSCOPY (EGD) WITH PROPOFOL;  Surgeon: Jonathon Bellows, MD;  Location: [facility] ENDOSCOPY;  Service: Gastroenterology;  Laterality: N/A;   TUBAL LIGATION Bilateral 08/21/2020   Procedure: POST PARTUM TUBAL LIGATION;  Surgeon: Harlin Heys, MD;  Location: [facility] ORS;  Service: Gynecology;  Laterality: Bilateral;    Current Outpatient Medications on File Prior to Visit  Medication Sig Dispense Refill   acetaminophen (TYLENOL) 325 MG tablet Take 650 mg by mouth every 6 (six) hours as  needed for moderate pain.     docusate sodium (COLACE) 100 MG capsule Take 2 capsules (200 mg total) by mouth 2 (two) times daily as needed for mild constipation or moderate constipation. (Patient not taking: Reported on 10/23/2021)  0   No current facility-administered medications on file prior to visit.    Allergies  Allergen Reactions   Ciprofloxacin Hcl Rash    Social History   Socioeconomic History   Marital status: Married    Spouse name: Misael   Number of children: Not on file   Years of education: Not on file   Highest education level: Not on file  Occupational History   Not on file  Tobacco Use   Smoking status: Never   Smokeless tobacco: Never  Vaping Use   Vaping Use: Never used  Substance and Sexual Activity   Alcohol use: No   Drug use: No   Sexual activity: Yes    Birth control/protection: Surgical    Comment: BTL  Other Topics Concern   Not on file  Social History Narrative   Not on file   Social Determinants of Health   Financial Resource Strain: Not on file  Food Insecurity: Not on file  Transportation  Needs: Not on file  Physical Activity: Not on file  Stress: Not on file  Social Connections: Not on file  Intimate Partner Violence: Not on file    Family History  Problem Relation Age of Onset   Breast cancer Maternal Aunt        52's   Diabetes Father    Diabetes Mother    Diabetes Sister    Ovarian cancer Neg Hx    Colon cancer Neg Hx    Heart disease Neg Hx     The following portions of the patient's history were reviewed and updated as appropriate: allergies, current medications, past family history, past medical history, past social history, past surgical history and problem list.  Review of Systems Review of Systems - Negative except as mentioned in HPI Review of Systems - General ROS: negative for - chills, fatigue, fever, hot flashes, malaise or night sweats Hematological and Lymphatic ROS: negative for - bleeding problems or  swollen lymph nodes Gastrointestinal ROS: negative for - abdominal pain, blood in stools, change in bowel habits and nausea/vomiting Musculoskeletal ROS: negative for - joint pain, muscle pain or muscular weakness Genito-Urinary ROS: negative for - change in menstrual cycle, dysmenorrhea,  dysuria, genital discharge, genital ulcers, hematuria, incontinence, irregular/heavy menses, nocturia. Positive for bilateral ovarian pelvic pain and dyspareunia  Objective:   BP 113/79   Pulse 72   Wt 149 lb (67.6 kg)   LMP 10/02/2021 (Approximate)   Breastfeeding No   BMI 25.58 kg/m  CONSTITUTIONAL: Well-developed, well-nourished female in no acute distress.  HENT:  Normocephalic, atraumatic.  NECK: Normal range of motion, supple, no masses.  Normal thyroid.  SKIN: Skin is warm and dry. No rash noted. Not diaphoretic. No erythema. No pallor. NEUROLGIC: Alert and oriented to person, place, and time. PSYCHIATRIC: Normal mood and affect. Normal behavior. Normal judgment and thought content. CARDIOVASCULAR:Not Examined RESPIRATORY: Not Examined BREASTS: Not Examined ABDOMEN: Soft, non distended; Non tender.  No Organomegaly. PELVIC:  External Genitalia: Normal  BUS: Normal  Vagina: Normal  Cervix: Normal , prolapsed to the introitus when bearing down   Uterus: Normal size, shape,consistency, mobile  Adnexa: Normal  RV: Normal   Bladder: Nontender MUSCULOSKELETAL: Normal range of motion. No tenderness.  No cyanosis, clubbing, or edema.     Assessment:   Prolapsed uterus Pelvic pain  History ovarian cyst   Plan:   Discussed possible pain due to recurrent cysts, vs prolapse. Pt encouraged to continue with pelvic floor pt. Will do u/s to evaluate for ovarian cyst. Discussed no cyst and pelvic floor therapy not helping , recommend consultation with MD to discuss pessary vs surgery. She verbalizes and agrees to plan.   Doreene Burke, CNM

## 2021-10-24 ENCOUNTER — Encounter: Payer: Self-pay | Admitting: Obstetrics and Gynecology

## 2021-10-24 ENCOUNTER — Encounter: Payer: Self-pay | Admitting: Certified Nurse Midwife

## 2021-10-27 ENCOUNTER — Ambulatory Visit: Payer: BC Managed Care – PPO | Attending: Certified Nurse Midwife

## 2021-10-27 DIAGNOSIS — R278 Other lack of coordination: Secondary | ICD-10-CM | POA: Diagnosis not present

## 2021-10-27 DIAGNOSIS — M533 Sacrococcygeal disorders, not elsewhere classified: Secondary | ICD-10-CM | POA: Diagnosis present

## 2021-10-27 DIAGNOSIS — M6281 Muscle weakness (generalized): Secondary | ICD-10-CM

## 2021-10-27 DIAGNOSIS — M6289 Other specified disorders of muscle: Secondary | ICD-10-CM

## 2021-10-27 NOTE — Therapy (Signed)
OUTPATIENT PHYSICAL THERAPY FEMALE PELVIC TREATMENT   Patient Name: Yesenia Davis MRN: [id number] DOB:05-07-1975, 46 y.o., female Today's Date: 10/28/2021   PT End of Session - 10/27/21 1701     Visit Number 9    Number of Visits 18    Date for PT Re-Evaluation 12/18/21    Authorization Type IE: 07/14/21; RC: 10/09/21    PT Start Time 1700    PT Stop Time 1745    PT Time Calculation (min) 45 min    Activity Tolerance Patient tolerated treatment well             Past Medical History:  Diagnosis Date   Acne vulgaris    Epigastric pain    Gastritis    GERD (gastroesophageal reflux disease)    Gestational diabetes    Migraines    Pre-diabetes    Past Surgical History:  Procedure Laterality Date   CHOLECYSTECTOMY  1996   cyst removed     from neck- b9   DILATION AND CURETTAGE OF UTERUS     ESOPHAGOGASTRODUODENOSCOPY (EGD) WITH PROPOFOL N/A 09/03/2021   Procedure: ESOPHAGOGASTRODUODENOSCOPY (EGD) WITH PROPOFOL;  Surgeon: Jonathon Bellows, MD;  Location: [facility] ENDOSCOPY;  Service: Gastroenterology;  Laterality: N/A;   TUBAL LIGATION Bilateral 08/21/2020   Procedure: POST PARTUM TUBAL LIGATION;  Surgeon: Harlin Heys, MD;  Location: [facility] ORS;  Service: Gynecology;  Laterality: Bilateral;   Patient Active Problem List   Diagnosis Date Noted   Prolapsed uterus 10/23/2021   Pelvic pain 10/23/2021   Dyspareunia, female 10/23/2021   Nausea and vomiting, possible gastric outlet obstruction 09/03/2021   History of gestational diabetes 04/08/2021   Shingles outbreak 04/25/2020    PCP: Philip Aspen, CNM  REFERRING PROVIDER: Philip Aspen, CNM   REFERRING DIAG:  [phone] (ICD-10-CM) - Pelvic floor weakness   THERAPY DIAG:  Other lack of coordination  Sacrococcygeal disorders, not elsewhere classified  Muscle weakness (generalized)  Pelvic floor dysfunction  Rationale for Evaluation and Treatment: Rehabilitation  ONSET DATE: Over a year ago, but last 3 months  worse                                                                                                                                                                                         PRECAUTIONS: None  WEIGHT BEARING RESTRICTIONS: No  FALLS:  Has patient fallen in last 6 months? No  OCCUPATION/SOCIAL ACTIVITIES: Standing job, pressing socks   PLOF: Independent   CHIEF CONCERN: Pt has tried pelvic floor PT before for lower abdominal pain and some pressure felt at the vaginal opening, but not how Pt is feeling it today. Pt has been  feeling "what I think is my cervix" when I'm standing for long periods of time or walking. It feels like a tampon is hanging and is about to come out. Pt also feels "something hanging out" when she wipes but does not get in the way of urination. Pt has had to stop dancing because she feels like whatever is there is going to come out and down to the floor. Pt said the doctor that she went to did not tell her about a prolapse and when she examined her and asked to cough, the Dr did not say anything. Pt has noticed more bulging out of an organ in a squat position. During sexual relations, Pt occasionally has pain vaginally but it is described more as pressure days after penetrative sex. When the Pt sneezes or coughs, she does have urinary leakage. During her last menstrual cycle, Pt had a very sharp pain at the vagina and it took the breath out of her. She has not had a hx of very painful menstrual periods. Pt has taken Tylenol for the pain and it does aleve the pain when it arises. Pt has had a hx of buttock/tailbone pain, and Pt did have a fall when she was a little girl on the tailbone. Pt did not have any fractures, but she is unable to sit on hard surfaces without pain.     LIVING ENVIRONMENT: Lives with: lives with their family and lives with their spouse Lives in: House/apartment   PATIENT GOALS: Pt would like to not feel the pressure at the end of the  day, help with feeling in control of leakage     UROLOGICAL HISTORY Fluid intake: Yes: agua, soda every now and then    Pain with urination: No Fully empty bladder: No Stream: stop and go intermittent, strong  Urgency: Yes Frequency: 4x at work, 6x at home Nocturia: 3x Leakage: Coughing, Sneezing, Exercise, and Intercourse Pads: Yes Type: pantyliners to heavier flow  Amount: 2x/day but does not wear everyday  Bladder control (0-10): 3/10   GASTROINTESTINAL HISTORY  Pain with bowel movement: No Type of bowel movement:Type (Bristol Stool Scale) 4 Frequency: every other day  Fully empty rectum: No Leakage: No Pads: No   SEXUAL HISTORY/FUNCTION Pain with intercourse: No Ability to have vaginal penetration: Yes   OBSTETRICAL HISTORY Vaginal deliveries: G5P4 Tearing: Yes: with 2 children Currently pregnant: No  GYNECOLOGICAL HISTORY Hysterectomy: no, tubal ligation Pelvic Organ Prolapse: None, but has sxs  Heaviness/pressure: yes   SUBJECTIVE:  Pt recently followed-up with CNM and is sending her to get an u/s to see if Pt is having cysts. Pt also told her about prolapse sxs and Pt does report and office note demonstrates Stage 2 uterine prolapse. Pt was told to take Tylenol and Ibuprofen for the pain.    PAIN:  Are you having pain? No NPRS scale: 0   OBJECTIVE: (07/28/21)   COGNITION: Overall cognitive status: Within functional limits for tasks assessed     POSTURE:  Iliac crest height: WNL Pelvic obliquity: WNL     RANGE OF MOTION:  (Norm range in degrees)  LEFT RIGHT  Lumbar forward flexion (65):  WNL    Lumbar extension (30): WNL    Lumbar lateral flexion (25):  WNL WNL  Thoracic and Lumbar rotation (30 degrees):    WNL WNL  Hip Flexion (0-125):   WNL WNL  Hip IR (0-45):  WNL WNL  Hip ER (0-45):  WNL WNL  Hip Adduction:  WNL WNL  Hip Abduction (0-40):  WNL WNL  Hip extension (0-15):  WNL WNL  (*= pain, Blank rows = not tested)   STRENGTH:  MMT    RLE  LLE  Hip Flexion 4 4  Hip Extension 4 4  Hip Abduction  5 5  Hip Adduction     Hip ER  5 5  Hip IR  5 5  Knee Extension 5 5  Knee Flexion 4 4  Dorsiflexion     Plantarflexion (seated) 5 5  (*= pain, Blank rows = not tested)   SPECIAL TESTS:   FABER (SN 81): negative B FADIR (SN 94): negative B   PALPATION:  Abdominal:  Diastasis: none   Rib flare: present B RLQ: tenderness upon palpation more than the LLQ   L coccyx bony prominence - tender upon palpation - surrounding musculature (gluteals/PFM) R coccyx bony prominence - tender upon palpation   L4-S1 - hypomobile  SIJ bilaterally - hypomobile with increased pain   Coccyx can move into slight extension when asked to sustain IAP increase "bear down" with no pain    EXTERNAL PELVIC EXAM: Patient educated on the purpose of the pelvic exam and articulated understanding; patient consented to the exam verbally.  Breath coordination: present but inconsistent  Voluntary Contraction: present, 1/5 MMT Relaxation: delayed  Perineal movement with sustained IAP increase ("bear down"): descent Perineal movement with rapid IAP increase ("cough"): no change  Pubic symphysis: no tenderness  (0= no contraction, 1= flicker, 2= weak squeeze, 3= fair squeeze with lift, 4= good squeeze and lift against resistance, 5= strong squeeze against strong resistance)    TODAY'S TREATMENT:  Neuromuscular Re-education: Discussion on seeking further options for Stage 2 uterine prolapse - pessary and surgical as Pt is hesitant to continue PFPT due to progression of prolapse and compliance outside of weekly sessions. DPT emphasized conservative approach being able to aid in managing and possiblity of reserving prolapse. Pt verbalized understanding and would like to continue PFPT and try to continue with HEP at home. Pt given resources in Spanish about "what is a pessary" and "surgical options for a prolapse" via IUGA (international  urogynecological association)   Discussion on studies showing how PFPT can reverse and diminish sxs of a prolapse at Stage 1 and Stage 2.   Review: Of gravity-assisted positions to help with heaviness/pressure at vaginal opening   Supine hooklying diaphragmatic breathing with VCs and TCs for downregulation of the nervous system and improved management of IAP - significant cueing for continued motion with breathing   [facility] abdominal rehab   Supine hooklying TrA contraction with coordinated exhale    Supine hooklying TrA w/marches     Patient response to interventions: Pt is comfortable continuing with PFPT    Patient Education:  Patient provided with HEP: no change - continue supine TrA activation and with marches. Patient educated throughout session on appropriate technique and form using multi-modal cueing, HEP, and activity modification. Patient will continue to benefit from further education in order to maximize compliance and understanding for long-term therapeutic gains.    ASSESSMENT:  Clinical Impression: Patient presents to clinic with excellent motivation to participate in today's session. Pt continues to demonstrate deficits in IAP management, PFM coordination, PFM strength, PFM endurance, LE strength, posture and pain. Per recent visit with CNM, Pt with Stage 2 uterine prolapse and is receiving a second opinion in [facility] (GYN) and is considering other options (pessary vs surgical). After some discussion, Pt would like to  continue with PFPT as DPT mentioned with consistency and practice, sxs of heaviness/pressure and pain in the lower abdominals should resolve; however will take some time. Pt verbalized understanding. Pt given educational handouts in Spanish about pessary and surgical options for a uterine prolapse (IUGA). Rest of session focused on review of the deep core and Pt required significant verbal/tactile cueing for proper diaphragmatic breathing in supine (a  continuous motion) and to decrease bodily compensations with LE challenge. With increased time, Pt able to demonstrate understanding. Patient will continue to benefit from skilled therapeutic intervention to address deficits in IAP management, PFM coordination, PFM strength, PFM endurance, LE strength, posture and pain in order to increase PLOF and improve overall QOL.    Objective Impairments: decreased activity tolerance, decreased coordination, decreased endurance, decreased strength, improper body mechanics, postural dysfunction, and pain.   Activity Limitations: carrying, lifting, sitting, standing, transfers, continence, toileting, locomotion level, and caring for others  Personal Factors: Age, Past/current experiences, Time since onset of injury/illness/exacerbation, and 1-2 comorbidities: prediabetic and migraines  are also affecting patient's functional outcome.   Rehab Potential: Good  Clinical Decision Making: Evolving/moderate complexity  Evaluation Complexity: Moderate   GOALS: Goals reviewed with patient? Yes  SHORT TERM GOALS: Target date: 10/06/21  Patient will decrease worst tailbone pain as reported on NPRS by at least 2 points to demonstrate clinically significant reduction in pain in order to restore/improve function and overall QOL. Baseline: 9/10; (9/28): 6/10  Goal status: MET   LONG TERM GOALS: Target date: 12/18/21  Patient will score  >/= 59 and 62 on FOTO Bowel Constipation/Urinary Problem in order to demonstrate improved PFM coordination, IAP management  and overall improved QOL.  Baseline: both 52, (9/6): 55 and 57  Goal status: IN PROGRESS  2.  Patient will report confidence in ability to control bladder > 6/10 in order to demonstrate improved function and ability to participate more fully in activities at home and in the community. Baseline: 3/10; (9/28): 7/10 Goal status: MET  3.  Patient will report less than 5 incidents of stress urinary  incontinence over the course of 3 weeks while coughing/sneezing/physical activity/sexual activity in order to demonstrate improved PFM coordination, strength, and function for improved overall QOL. Baseline: leakage occurs every time with increased IAP; (9/28): 2x over past 3 weeks with sneezing Goal status: MET  4.  Patient will be able to articulate decreased pressure at the vaginal opening and demonstrate 3-5 postures that encourage gravity assisted repositioning of pelvic organs to decrease discomfort and pressure at end of day in order to participate more fully in activities at home and in the community.  Baseline: 8/10 pressure at the end of the day/feeling of "something" falling; (9/28): 5/10 and does not feel like "something" is dropping as much, has been better Goal status: MET  5.  Patient will demonstrate circumferential and sequential contraction of >4/5 MMT, > 6 sec hold x10 and 5 consecutive quick flicks with </= 10 min rest between testing bouts, and relaxation of the PFM coordinated with breath for improved management of intra-abdominal pressure and normal bowel and bladder function without the presence of pain nor incontinence in order to improve participation at home and in the community. Baseline: will assess next visit, 1/5 MMT/no endurance tested (07/28/21) Goal status: IN PROGRESS    PLAN: PT Frequency: 1x/week  PT Duration: 12 weeks  Planned Interventions: Therapeutic exercises, Therapeutic activity, Neuromuscular re-education, Balance training, Gait training, Patient/Family education, Joint mobilization, Spinal mobilization, Cryotherapy, Moist heat, Taping,  and Manual therapy  Plan For Next Session:  continue deep core sitting now or on all fours, standing at wall?    Lynae Pederson, PT, DPT  10/28/2021, 8:04 AM

## 2021-11-04 ENCOUNTER — Ambulatory Visit: Payer: BC Managed Care – PPO

## 2021-11-04 DIAGNOSIS — M6281 Muscle weakness (generalized): Secondary | ICD-10-CM

## 2021-11-04 DIAGNOSIS — M6289 Other specified disorders of muscle: Secondary | ICD-10-CM

## 2021-11-04 DIAGNOSIS — M533 Sacrococcygeal disorders, not elsewhere classified: Secondary | ICD-10-CM

## 2021-11-04 DIAGNOSIS — R278 Other lack of coordination: Secondary | ICD-10-CM | POA: Diagnosis not present

## 2021-11-04 NOTE — Therapy (Signed)
OUTPATIENT PHYSICAL THERAPY FEMALE PELVIC PROGRESS NOTE/TREATMENT   Patient Name: Yesenia Davis MRN: [id number] DOB:1975/01/23, 46 y.o., female Today's Date: 11/04/2021   PT End of Session - 11/04/21 1613     Visit Number 10    Number of Visits 18    Date for PT Re-Evaluation 12/18/21    Authorization Type IE: 07/14/21; RC: 10/09/21 ; PN:11/04/21    PT Start Time 1615    PT Stop Time 1655    PT Time Calculation (min) 40 min    Activity Tolerance Patient tolerated treatment well             Past Medical History:  Diagnosis Date   Acne vulgaris    Epigastric pain    Gastritis    GERD (gastroesophageal reflux disease)    Gestational diabetes    Migraines    Pre-diabetes    Past Surgical History:  Procedure Laterality Date   CHOLECYSTECTOMY  1996   cyst removed     from neck- b9   DILATION AND CURETTAGE OF UTERUS     ESOPHAGOGASTRODUODENOSCOPY (EGD) WITH PROPOFOL N/A 09/03/2021   Procedure: ESOPHAGOGASTRODUODENOSCOPY (EGD) WITH PROPOFOL;  Surgeon: Jonathon Bellows, MD;  Location: [facility] ENDOSCOPY;  Service: Gastroenterology;  Laterality: N/A;   TUBAL LIGATION Bilateral 08/21/2020   Procedure: POST PARTUM TUBAL LIGATION;  Surgeon: Harlin Heys, MD;  Location: [facility] ORS;  Service: Gynecology;  Laterality: Bilateral;   Patient Active Problem List   Diagnosis Date Noted   Prolapsed uterus 10/23/2021   Pelvic pain 10/23/2021   Dyspareunia, female 10/23/2021   Nausea and vomiting, possible gastric outlet obstruction 09/03/2021   History of gestational diabetes 04/08/2021   Shingles outbreak 04/25/2020    PCP: Philip Aspen, CNM  REFERRING PROVIDER: Philip Aspen, CNM   REFERRING DIAG:  [phone] (ICD-10-CM) - Pelvic floor weakness   THERAPY DIAG:  Other lack of coordination  Sacrococcygeal disorders, not elsewhere classified  Muscle weakness (generalized)  Pelvic floor dysfunction  Rationale for Evaluation and Treatment: Rehabilitation  ONSET DATE: Over a  year ago, but last 3 months worse                                                                                                                                                                                         PRECAUTIONS: None  WEIGHT BEARING RESTRICTIONS: No  FALLS:  Has patient fallen in last 6 months? No  OCCUPATION/SOCIAL ACTIVITIES: Standing job, pressing socks   PLOF: Independent   CHIEF CONCERN: Pt has tried pelvic floor PT before for lower abdominal pain and some pressure felt at the vaginal opening, but not how Pt is feeling it today.  Pt has been feeling "what I think is my cervix" when I'm standing for long periods of time or walking. It feels like a tampon is hanging and is about to come out. Pt also feels "something hanging out" when she wipes but does not get in the way of urination. Pt has had to stop dancing because she feels like whatever is there is going to come out and down to the floor. Pt said the doctor that she went to did not tell her about a prolapse and when she examined her and asked to cough, the Dr did not say anything. Pt has noticed more bulging out of an organ in a squat position. During sexual relations, Pt occasionally has pain vaginally but it is described more as pressure days after penetrative sex. When the Pt sneezes or coughs, she does have urinary leakage. During her last menstrual cycle, Pt had a very sharp pain at the vagina and it took the breath out of her. She has not had a hx of very painful menstrual periods. Pt has taken Tylenol for the pain and it does aleve the pain when it arises. Pt has had a hx of buttock/tailbone pain, and Pt did have a fall when she was a little girl on the tailbone. Pt did not have any fractures, but she is unable to sit on hard surfaces without pain.     LIVING ENVIRONMENT: Lives with: lives with their family and lives with their spouse Lives in: House/apartment   PATIENT GOALS: Pt would like to not feel the  pressure at the end of the day, help with feeling in control of leakage     UROLOGICAL HISTORY Fluid intake: Yes: agua, soda every now and then    Pain with urination: No Fully empty bladder: No Stream: stop and go intermittent, strong  Urgency: Yes Frequency: 4x at work, 6x at home Nocturia: 3x Leakage: Coughing, Sneezing, Exercise, and Intercourse Pads: Yes Type: pantyliners to heavier flow  Amount: 2x/day but does not wear everyday  Bladder control (0-10): 3/10   GASTROINTESTINAL HISTORY  Pain with bowel movement: No Type of bowel movement:Type (Bristol Stool Scale) 4 Frequency: every other day  Fully empty rectum: No Leakage: No Pads: No   SEXUAL HISTORY/FUNCTION Pain with intercourse: No Ability to have vaginal penetration: Yes   OBSTETRICAL HISTORY Vaginal deliveries: G5P4 Tearing: Yes: with 2 children Currently pregnant: No  GYNECOLOGICAL HISTORY Hysterectomy: no, tubal ligation Pelvic Organ Prolapse: None, but has sxs  Heaviness/pressure: yes   SUBJECTIVE:  Pt has been having lower quadrant pain for about a week and a half. Pt reports it might be menstrual pain but she has not had that in the past. Pt has noticed more of this pain when sitting and currently.    PAIN:  Are you having pain? Yes NPRS scale: 6/10    OBJECTIVE: (07/28/21)   COGNITION: Overall cognitive status: Within functional limits for tasks assessed     POSTURE:  Iliac crest height: WNL Pelvic obliquity: WNL     RANGE OF MOTION:  (Norm range in degrees)  LEFT RIGHT  Lumbar forward flexion (65):  WNL    Lumbar extension (30): WNL    Lumbar lateral flexion (25):  WNL WNL  Thoracic and Lumbar rotation (30 degrees):    WNL WNL  Hip Flexion (0-125):   WNL WNL  Hip IR (0-45):  WNL WNL  Hip ER (0-45):  WNL WNL  Hip Adduction:   WNL WNL  Hip Abduction (  0-40):  WNL WNL  Hip extension (0-15):  WNL WNL  (*= pain, Blank rows = not tested)   STRENGTH: MMT    RLE  LLE  Hip  Flexion 4 4  Hip Extension 4 4  Hip Abduction  5 5  Hip Adduction     Hip ER  5 5  Hip IR  5 5  Knee Extension 5 5  Knee Flexion 4 4  Dorsiflexion     Plantarflexion (seated) 5 5  (*= pain, Blank rows = not tested)   SPECIAL TESTS:   FABER (SN 81): negative B FADIR (SN 94): negative B   PALPATION:  Abdominal:  Diastasis: none   Rib flare: present B RLQ: tenderness upon palpation more than the LLQ   L coccyx bony prominence - tender upon palpation - surrounding musculature (gluteals/PFM) R coccyx bony prominence - tender upon palpation   L4-S1 - hypomobile  SIJ bilaterally - hypomobile with increased pain   Coccyx can move into slight extension when asked to sustain IAP increase "bear down" with no pain    EXTERNAL PELVIC EXAM: Patient educated on the purpose of the pelvic exam and articulated understanding; patient consented to the exam verbally.  Breath coordination: present but inconsistent  Voluntary Contraction: present, 1/5 MMT Relaxation: delayed  Perineal movement with sustained IAP increase ("bear down"): descent Perineal movement with rapid IAP increase ("cough"): no change  Pubic symphysis: no tenderness  (0= no contraction, 1= flicker, 2= weak squeeze, 3= fair squeeze with lift, 4= good squeeze and lift against resistance, 5= strong squeeze against strong resistance)    TODAY'S TREATMENT:  Manual Therapy: Myofascial release at lower abdominals from pubic bone superiorly - no increased pain but some tenderness  Pt verbalized how to perform as part of HEP   Abdominal myofascial release for improved fascial sling mobility and pain modulation  Significant tension with increased pain in the lower quadrants  Increased pain with superficial palpation but eased with continued intervention around the LLQ and when returned to LLQ, pain eased    Neuromuscular Re-education: Discussion on how sxs of a prolapse can be lower abdominal pressure and lower back pain.  Pt verbalized understanding.  Supine hooklying diaphragmatic breathing with VCs and TCs for downregulation of the nervous system and improved management of IAP - significant cueing for continued motion with breathing   Seated TrA activation with coordinated breath for improved IAP management, VCs and TCs required for proper technique   Supine hooklying PFM lengthening techniques/pain modulation with diaphragmatic breathing, VCs and TCs as needed       Butterfly pose "adductor stretch"    Patient response to interventions: Pt at ended session with 3/10    Patient Education:  Patient provided with HEP: supine butterfly pose, seated TrA activation, review of abdominal massage. Patient educated throughout session on appropriate technique and form using multi-modal cueing, HEP, and activity modification. Patient will continue to benefit from further education in order to maximize compliance and understanding for long-term therapeutic gains.    ASSESSMENT:  Clinical Impression: Patient presents to clinic with excellent motivation to participate in today's progress note/treatment. Pt with 6/10 LLQ described as constant and occasionally sharp. Based on FOTO Reassessment on 9/28 of Bowel Constipation and Urinary Leakage, Pt demonstrates minimal improvement (3-5 points). Pt has improved significantly with urinary leakage (2 episodes of stress UI over the course of 3 weeks), improved bladder control (7/10) compared to IE (3/10), improved worst tailbone pain (6/10) compared to IE (9/10  and inability to sit on hard surfaces for long periods of time), and improved PFM coordination and IAP management as Pt does not have as much vaginal pressure/heaviness. Pt since 9/28 has been diagnosed with a Stage 2 uterine prolapse and continues to have this LLQ. Pt is scheduled for an u/s to rule out any concerns. Discussion with Pt on continuing to progress deep core and work on pain modulation. Pt required moderate VCs  and TCs with TrA activation progression in various positions. Pt taught how to perform myofascial/abdominal massage as part of HEP. After manual techniques, Pt ended session with 3/10 pain in LLQ. Although Pt demonstrates improvement since initial evaluation, patient will continue to benefit from skilled therapeutic intervention to address remaining deficits in IAP management, PFM coordination, PFM strength, PFM endurance, LE strength, posture and pain in order to increase PLOF and improve overall QOL.   Objective Impairments: decreased activity tolerance, decreased coordination, decreased endurance, decreased strength, improper body mechanics, postural dysfunction, and pain.   Activity Limitations: carrying, lifting, sitting, standing, transfers, continence, toileting, locomotion level, and caring for others  Personal Factors: Age, Past/current experiences, Time since onset of injury/illness/exacerbation, and 1-2 comorbidities: prediabetic and migraines  are also affecting patient's functional outcome.   Rehab Potential: Good  Clinical Decision Making: Evolving/moderate complexity  Evaluation Complexity: Moderate   GOALS: Goals reviewed with patient? Yes  SHORT TERM GOALS: Target date: 10/06/21  Patient will decrease worst tailbone pain as reported on NPRS by at least 2 points to demonstrate clinically significant reduction in pain in order to restore/improve function and overall QOL. Baseline: 9/10; (9/28): 6/10  Goal status: MET   LONG TERM GOALS: Target date: 12/18/21  Patient will score  >/= 59 and 62 on FOTO Bowel Constipation/Urinary Problem in order to demonstrate improved PFM coordination, IAP management  and overall improved QOL.  Baseline: both 52, (9/6): 55 and 57   Goal status: IN PROGRESS  2.  Patient will report confidence in ability to control bladder > 6/10 in order to demonstrate improved function and ability to participate more fully in activities at home and in the  community. Baseline: 3/10; (9/28): 7/10 Goal status: MET  3.  Patient will report less than 5 incidents of stress urinary incontinence over the course of 3 weeks while coughing/sneezing/physical activity/sexual activity in order to demonstrate improved PFM coordination, strength, and function for improved overall QOL. Baseline: leakage occurs every time with increased IAP; (9/28): 2x over past 3 weeks with sneezing Goal status: MET  4.  Patient will be able to articulate decreased pressure at the vaginal opening and demonstrate 3-5 postures that encourage gravity assisted repositioning of pelvic organs to decrease discomfort and pressure at end of day in order to participate more fully in activities at home and in the community.  Baseline: 8/10 pressure at the end of the day/feeling of "something" falling; (9/28): 5/10 and does not feel like "something" is dropping as much, has been better Goal status: MET  5.  Patient will demonstrate circumferential and sequential contraction of >4/5 MMT, > 6 sec hold x10 and 5 consecutive quick flicks with </= 10 min rest between testing bouts, and relaxation of the PFM coordinated with breath for improved management of intra-abdominal pressure and normal bowel and bladder function without the presence of pain nor incontinence in order to improve participation at home and in the community. Baseline: will assess next visit, 1/5 MMT/no endurance tested (07/28/21) Goal status: IN PROGRESS    PLAN: PT Frequency: 1x/week  PT Duration: 12 weeks  Planned Interventions: Therapeutic exercises, Therapeutic activity, Neuromuscular re-education, Balance training, Gait training, Patient/Family education, Joint mobilization, Spinal mobilization, Cryotherapy, Moist heat, Taping, and Manual therapy  Plan For Next Session:  continue deep core on all fours, standing at wall?, how was the massage/pain?    [employer], PT, DPT  11/04/2021, 4:13 PM

## 2021-11-10 ENCOUNTER — Other Ambulatory Visit: Payer: BC Managed Care – PPO

## 2021-11-18 ENCOUNTER — Ambulatory Visit: Payer: BC Managed Care – PPO | Attending: Certified Nurse Midwife

## 2021-11-18 DIAGNOSIS — M6281 Muscle weakness (generalized): Secondary | ICD-10-CM | POA: Diagnosis present

## 2021-11-18 DIAGNOSIS — M533 Sacrococcygeal disorders, not elsewhere classified: Secondary | ICD-10-CM | POA: Insufficient documentation

## 2021-11-18 DIAGNOSIS — M6289 Other specified disorders of muscle: Secondary | ICD-10-CM | POA: Insufficient documentation

## 2021-11-18 DIAGNOSIS — R278 Other lack of coordination: Secondary | ICD-10-CM | POA: Diagnosis not present

## 2021-11-18 NOTE — Therapy (Signed)
OUTPATIENT PHYSICAL THERAPY FEMALE PELVIC TREATMENT   Patient Name: Yesenia Davis MRN: [id number] DOB:1975/03/23, 46 y.o., female Today's Date: 11/18/2021   PT End of Session - 11/18/21 1443     Visit Number 11    Number of Visits 18    Date for PT Re-Evaluation 12/18/21    Authorization Type IE: 07/14/21; RC: 10/09/21 ; PN:11/04/21    PT Start Time 1445    PT Stop Time 1525    PT Time Calculation (min) 40 min    Activity Tolerance Patient tolerated treatment well             Past Medical History:  Diagnosis Date   Acne vulgaris    Epigastric pain    Gastritis    GERD (gastroesophageal reflux disease)    Gestational diabetes    Migraines    Pre-diabetes    Past Surgical History:  Procedure Laterality Date   CHOLECYSTECTOMY  1996   cyst removed     from neck- b9   DILATION AND CURETTAGE OF UTERUS     ESOPHAGOGASTRODUODENOSCOPY (EGD) WITH PROPOFOL N/A 09/03/2021   Procedure: ESOPHAGOGASTRODUODENOSCOPY (EGD) WITH PROPOFOL;  Surgeon: Jonathon Bellows, MD;  Location: [facility] ENDOSCOPY;  Service: Gastroenterology;  Laterality: N/A;   TUBAL LIGATION Bilateral 08/21/2020   Procedure: POST PARTUM TUBAL LIGATION;  Surgeon: Harlin Heys, MD;  Location: [facility] ORS;  Service: Gynecology;  Laterality: Bilateral;   Patient Active Problem List   Diagnosis Date Noted   Prolapsed uterus 10/23/2021   Pelvic pain 10/23/2021   Dyspareunia, female 10/23/2021   Nausea and vomiting, possible gastric outlet obstruction 09/03/2021   History of gestational diabetes 04/08/2021   Shingles outbreak 04/25/2020    PCP: Philip Aspen, CNM  REFERRING PROVIDER: Philip Aspen, CNM   REFERRING DIAG:  [phone] (ICD-10-CM) - Pelvic floor weakness   THERAPY DIAG:  Other lack of coordination  Sacrococcygeal disorders, not elsewhere classified  Pelvic floor dysfunction  Muscle weakness (generalized)  Rationale for Evaluation and Treatment: Rehabilitation  ONSET DATE: Over a year ago, but last  3 months worse                                                                                                                                                                                         PRECAUTIONS: None  WEIGHT BEARING RESTRICTIONS: No  FALLS:  Has patient fallen in last 6 months? No  OCCUPATION/SOCIAL ACTIVITIES: Standing job, pressing socks   PLOF: Independent   CHIEF CONCERN: Pt has tried pelvic floor PT before for lower abdominal pain and some pressure felt at the vaginal opening, but not how Pt is feeling it today. Pt  has been feeling "what I think is my cervix" when I'm standing for long periods of time or walking. It feels like a tampon is hanging and is about to come out. Pt also feels "something hanging out" when she wipes but does not get in the way of urination. Pt has had to stop dancing because she feels like whatever is there is going to come out and down to the floor. Pt said the doctor that she went to did not tell her about a prolapse and when she examined her and asked to cough, the Dr did not say anything. Pt has noticed more bulging out of an organ in a squat position. During sexual relations, Pt occasionally has pain vaginally but it is described more as pressure days after penetrative sex. When the Pt sneezes or coughs, she does have urinary leakage. During her last menstrual cycle, Pt had a very sharp pain at the vagina and it took the breath out of her. She has not had a hx of very painful menstrual periods. Pt has taken Tylenol for the pain and it does aleve the pain when it arises. Pt has had a hx of buttock/tailbone pain, and Pt did have a fall when she was a little girl on the tailbone. Pt did not have any fractures, but she is unable to sit on hard surfaces without pain.     LIVING ENVIRONMENT: Lives with: lives with their family and lives with their spouse Lives in: House/apartment   PATIENT GOALS: Pt would like to not feel the pressure at the end  of the day, help with feeling in control of leakage     UROLOGICAL HISTORY Fluid intake: Yes: agua, soda every now and then    Pain with urination: No Fully empty bladder: No Stream: stop and go intermittent, strong  Urgency: Yes Frequency: 4x at work, 6x at home Nocturia: 3x Leakage: Coughing, Sneezing, Exercise, and Intercourse Pads: Yes Type: pantyliners to heavier flow  Amount: 2x/day but does not wear everyday  Bladder control (0-10): 3/10   GASTROINTESTINAL HISTORY  Pain with bowel movement: No Type of bowel movement:Type (Bristol Stool Scale) 4 Frequency: every other day  Fully empty rectum: No Leakage: No Pads: No   SEXUAL HISTORY/FUNCTION Pain with intercourse: No Ability to have vaginal penetration: Yes   OBSTETRICAL HISTORY Vaginal deliveries: G5P4 Tearing: Yes: with 2 children Currently pregnant: No  GYNECOLOGICAL HISTORY Hysterectomy: no, tubal ligation Pelvic Organ Prolapse: None, but has sxs  Heaviness/pressure: yes   SUBJECTIVE:  Pt has a little bit of a cold right now. Pt does feel a bit more pressure at the vaginal opening when she goes into standing or squatting to pick something up from the ground.   PAIN:  Are you having pain? No NPRS scale:     OBJECTIVE: (07/28/21)   COGNITION: Overall cognitive status: Within functional limits for tasks assessed     POSTURE:  Iliac crest height: WNL Pelvic obliquity: WNL     RANGE OF MOTION:  (Norm range in degrees)  LEFT RIGHT  Lumbar forward flexion (65):  WNL    Lumbar extension (30): WNL    Lumbar lateral flexion (25):  WNL WNL  Thoracic and Lumbar rotation (30 degrees):    WNL WNL  Hip Flexion (0-125):   WNL WNL  Hip IR (0-45):  WNL WNL  Hip ER (0-45):  WNL WNL  Hip Adduction:   WNL WNL  Hip Abduction (0-40):  WNL WNL  Hip extension (0-15):  WNL WNL  (*= pain, Blank rows = not tested)   STRENGTH: MMT    RLE  LLE  Hip Flexion 4 4  Hip Extension 4 4  Hip Abduction  5 5  Hip  Adduction     Hip ER  5 5  Hip IR  5 5  Knee Extension 5 5  Knee Flexion 4 4  Dorsiflexion     Plantarflexion (seated) 5 5  (*= pain, Blank rows = not tested)   SPECIAL TESTS:   FABER (SN 81): negative B FADIR (SN 94): negative B   PALPATION:  Abdominal:  Diastasis: none   Rib flare: present B RLQ: tenderness upon palpation more than the LLQ   L coccyx bony prominence - tender upon palpation - surrounding musculature (gluteals/PFM) R coccyx bony prominence - tender upon palpation   L4-S1 - hypomobile  SIJ bilaterally - hypomobile with increased pain   Coccyx can move into slight extension when asked to sustain IAP increase "bear down" with no pain    EXTERNAL PELVIC EXAM: Patient educated on the purpose of the pelvic exam and articulated understanding; patient consented to the exam verbally.  Breath coordination: present but inconsistent  Voluntary Contraction: present, 1/5 MMT Relaxation: delayed  Perineal movement with sustained IAP increase ("bear down"): descent Perineal movement with rapid IAP increase ("cough"): no change  Pubic symphysis: no tenderness  (0= no contraction, 1= flicker, 2= weak squeeze, 3= fair squeeze with lift, 4= good squeeze and lift against resistance, 5= strong squeeze against strong resistance)    TODAY'S TREATMENT:  Neuromuscular Re-education: Discussion on how increased pressure felt at the vaginal opening is a common sx of a POP. Discussion on diaphragmatic breathing during transitional movements and during physical activity.   Seated TrA activation with coordinated breath for improved IAP management, VCs and TCs required for proper technique -significant review  Seated TrA activation with seated march for improved IAP management, VCs and TCs required   STSs with coordinated breath for improved IAP management, VCs and TCs required   Standing wall squats with coordinated breath for improved IAP management, VCs and TCs required   Challenge: with weighted ball with overheard press   Patient response to interventions: Pt with improved heaviness sensation at end of session.   Patient Education:  Patient provided with HEP: seated TrA activation review, seated TrA with marches, standing wall squats. Patient educated throughout session on appropriate technique and form using multi-modal cueing, HEP, and activity modification. Patient will continue to benefit from further education in order to maximize compliance and understanding for long-term therapeutic gains.    ASSESSMENT:  Clinical Impression: Patient presents to clinic with excellent motivation to participate in today's treatment. Pt continues to demonstrate deficits in IAP management, PFM coordination, PFM strength, PFM endurance, LE strength, posture and pain. Pt reports having improved urinary leakage. Pt required significant VCs and TCs for review of seated TrA activation for proper technique and to coordinate with breathing. After increased time, Pt able to demonstrate understanding and progress to TrA activation with LE challenge. Continued education on sxs of POP and how that can vary depending on previous habits and continued following of HEP. Pt ended session with decrease heaviness felt at the vaginal opening. Pt responded positively to active and educational interventions. Pt will continue to benefit from skilled therapeutic intervention to address remaining deficits in IAP management, PFM coordination, PFM strength, PFM endurance, LE strength, posture and pain in order to increase PLOF and improve overall QOL.  Objective Impairments: decreased activity tolerance, decreased coordination, decreased endurance, decreased strength, improper body mechanics, postural dysfunction, and pain.   Activity Limitations: carrying, lifting, sitting, standing, transfers, continence, toileting, locomotion level, and caring for others  Personal Factors: Age, Past/current  experiences, Time since onset of injury/illness/exacerbation, and 1-2 comorbidities: prediabetic and migraines  are also affecting patient's functional outcome.   Rehab Potential: Good  Clinical Decision Making: Evolving/moderate complexity  Evaluation Complexity: Moderate   GOALS: Goals reviewed with patient? Yes  SHORT TERM GOALS: Target date: 10/06/21  Patient will decrease worst tailbone pain as reported on NPRS by at least 2 points to demonstrate clinically significant reduction in pain in order to restore/improve function and overall QOL. Baseline: 9/10; (9/28): 6/10  Goal status: MET   LONG TERM GOALS: Target date: 12/18/21  Patient will score  >/= 59 and 62 on FOTO Bowel Constipation/Urinary Problem in order to demonstrate improved PFM coordination, IAP management  and overall improved QOL.  Baseline: both 52, (9/6): 55 and 57   Goal status: IN PROGRESS  2.  Patient will report confidence in ability to control bladder > 6/10 in order to demonstrate improved function and ability to participate more fully in activities at home and in the community. Baseline: 3/10; (9/28): 7/10 Goal status: MET  3.  Patient will report less than 5 incidents of stress urinary incontinence over the course of 3 weeks while coughing/sneezing/physical activity/sexual activity in order to demonstrate improved PFM coordination, strength, and function for improved overall QOL. Baseline: leakage occurs every time with increased IAP; (9/28): 2x over past 3 weeks with sneezing Goal status: MET  4.  Patient will be able to articulate decreased pressure at the vaginal opening and demonstrate 3-5 postures that encourage gravity assisted repositioning of pelvic organs to decrease discomfort and pressure at end of day in order to participate more fully in activities at home and in the community.  Baseline: 8/10 pressure at the end of the day/feeling of "something" falling; (9/28): 5/10 and does not feel like  "something" is dropping as much, has been better Goal status: MET  5.  Patient will demonstrate circumferential and sequential contraction of >4/5 MMT, > 6 sec hold x10 and 5 consecutive quick flicks with </= 10 min rest between testing bouts, and relaxation of the PFM coordinated with breath for improved management of intra-abdominal pressure and normal bowel and bladder function without the presence of pain nor incontinence in order to improve participation at home and in the community. Baseline: will assess next visit, 1/5 MMT/no endurance tested (07/28/21) Goal status: IN PROGRESS    PLAN: PT Frequency: 1x/week  PT Duration: 12 weeks  Planned Interventions: Therapeutic exercises, Therapeutic activity, Neuromuscular re-education, Balance training, Gait training, Patient/Family education, Joint mobilization, Spinal mobilization, Cryotherapy, Moist heat, Taping, and Manual therapy  Plan For Next Session: how was core? continue deep core on all fours, continue standing deep core    Adams Hinch, PT, DPT  11/18/2021, 2:44 PM

## 2021-11-20 ENCOUNTER — Ambulatory Visit (INDEPENDENT_AMBULATORY_CARE_PROVIDER_SITE_OTHER): Payer: BC Managed Care – PPO

## 2021-11-20 DIAGNOSIS — Z8742 Personal history of other diseases of the female genital tract: Secondary | ICD-10-CM

## 2021-11-20 DIAGNOSIS — N814 Uterovaginal prolapse, unspecified: Secondary | ICD-10-CM | POA: Diagnosis not present

## 2021-11-20 DIAGNOSIS — R102 Pelvic and perineal pain: Secondary | ICD-10-CM | POA: Diagnosis not present

## 2021-11-24 ENCOUNTER — Encounter: Payer: Self-pay | Admitting: Certified Nurse Midwife

## 2021-11-25 ENCOUNTER — Ambulatory Visit: Payer: BC Managed Care – PPO

## 2021-11-27 ENCOUNTER — Ambulatory Visit: Payer: BC Managed Care – PPO

## 2021-11-27 DIAGNOSIS — M6289 Other specified disorders of muscle: Secondary | ICD-10-CM

## 2021-11-27 DIAGNOSIS — M533 Sacrococcygeal disorders, not elsewhere classified: Secondary | ICD-10-CM

## 2021-11-27 DIAGNOSIS — R278 Other lack of coordination: Secondary | ICD-10-CM

## 2021-11-27 DIAGNOSIS — M6281 Muscle weakness (generalized): Secondary | ICD-10-CM

## 2021-11-27 NOTE — Therapy (Signed)
OUTPATIENT PHYSICAL THERAPY FEMALE PELVIC TREATMENT   Patient Name: Yesenia Davis MRN: [id number] DOB:1975/04/01, 46 y.o., female Today's Date: 11/27/2021   PT End of Session - 11/27/21 1310     Visit Number 12    Number of Visits 18    Date for PT Re-Evaluation 12/18/21    Authorization Type IE: 07/14/21; RC: 10/09/21 ; PN:11/04/21    PT Start Time 1315    PT Stop Time 1355    PT Time Calculation (min) 40 min    Activity Tolerance Patient tolerated treatment well             Past Medical History:  Diagnosis Date   Acne vulgaris    Epigastric pain    Gastritis    GERD (gastroesophageal reflux disease)    Gestational diabetes    Migraines    Pre-diabetes    Past Surgical History:  Procedure Laterality Date   CHOLECYSTECTOMY  1996   cyst removed     from neck- b9   DILATION AND CURETTAGE OF UTERUS     ESOPHAGOGASTRODUODENOSCOPY (EGD) WITH PROPOFOL N/A 09/03/2021   Procedure: ESOPHAGOGASTRODUODENOSCOPY (EGD) WITH PROPOFOL;  Surgeon: Jonathon Bellows, MD;  Location: [facility] END[facility]COPY;  Service: Gastroenterology;  Laterality: N/A;   TUBAL LIGATION Bilateral 08/21/2020   Procedure: POST PARTUM TUBAL LIGATION;  Surgeon: Harlin Heys, MD;  Location: [facility] ORS;  Service: Gynecology;  Laterality: Bilateral;   Patient Active Problem List   Diagnosis Date Noted   Prolapsed uterus 10/23/2021   Pelvic pain 10/23/2021   Dyspareunia, female 10/23/2021   Nausea and vomiting, possible gastric outlet obstruction 09/03/2021   History of gestational diabetes 04/08/2021   Shingles outbreak 04/25/2020    PCP: Philip Aspen, CNM  REFERRING PROVIDER: Philip Aspen, CNM   REFERRING DIAG:  [phone] (ICD-10-CM) - Pelvic floor weakness   THERAPY DIAG:  Other lack of coordination  Sacrococcygeal disorders, not elsewhere classified  Pelvic floor dysfunction  Muscle weakness (generalized)  Rationale for Evaluation and Treatment: Rehabilitation  ONSET DATE: Over a year ago, but  last 3 months worse                                                                                                                                                                                         PRECAUTIONS: None  WEIGHT BEARING RESTRICTIONS: No  FALLS:  Has patient fallen in last 6 months? No  OCCUPATION/SOCIAL ACTIVITIES: Standing job, pressing socks   PLOF: Independent   CHIEF CONCERN: Pt has tried pelvic floor PT before for lower abdominal pain and some pressure felt at the vaginal opening, but not how Pt is feeling it today. Pt  has been feeling "what I think is my cervix" when I'm standing for long periods of time or walking. It feels like a tampon is hanging and is about to come out. Pt also feels "something hanging out" when she wipes but does not get in the way of urination. Pt has had to stop dancing because she feels like whatever is there is going to come out and down to the floor. Pt said the doctor that she went to did not tell her about a prolapse and when she examined her and asked to cough, the Dr did not say anything. Pt has noticed more bulging out of an organ in a squat position. During sexual relations, Pt occasionally has pain vaginally but it is described more as pressure days after penetrative sex. When the Pt sneezes or coughs, she does have urinary leakage. During her last menstrual cycle, Pt had a very sharp pain at the vagina and it took the breath out of her. She has not had a hx of very painful menstrual periods. Pt has taken Tylenol for the pain and it does aleve the pain when it arises. Pt has had a hx of buttock/tailbone pain, and Pt did have a fall when she was a little girl on the tailbone. Pt did not have any fractures, but she is unable to sit on hard surfaces without pain.     LIVING ENVIRONMENT: Lives with: lives with their family and lives with their spouse Lives in: House/apartment   PATIENT GOALS: Pt would like to not feel the pressure at the  end of the day, help with feeling in control of leakage     UROLOGICAL HISTORY Fluid intake: Yes: agua, soda every now and then    Pain with urination: No Fully empty bladder: No Stream: stop and go intermittent, strong  Urgency: Yes Frequency: 4x at work, 6x at home Nocturia: 3x Leakage: Coughing, Sneezing, Exercise, and Intercourse Pads: Yes Type: pantyliners to heavier flow  Amount: 2x/day but does not wear everyday  Bladder control (0-10): 3/10   GASTROINTESTINAL HISTORY  Pain with bowel movement: No Type of bowel movement:Type (Bristol Stool Scale) 4 Frequency: every other day  Fully empty rectum: No Leakage: No Pads: No   SEXUAL HISTORY/FUNCTION Pain with intercourse: No Ability to have vaginal penetration: Yes   OBSTETRICAL HISTORY Vaginal deliveries: G5P4 Tearing: Yes: with 2 children Currently pregnant: No  GYNECOLOGICAL HISTORY Hysterectomy: no, tubal ligation Pelvic Organ Prolapse: None, but has sxs  Heaviness/pressure: yes   SUBJECTIVE:  Pt has continued to have this lingering cold. Pt has been coughing more and more which is when she begins to feel more of the prolapse.   PAIN:  Are you having pain? No NPRS scale:     OBJECTIVE: (07/28/21)   COGNITION: Overall cognitive status: Within functional limits for tasks assessed     POSTURE:  Iliac crest height: WNL Pelvic obliquity: WNL     RANGE OF MOTION:  (Norm range in degrees)  LEFT RIGHT  Lumbar forward flexion (65):  WNL    Lumbar extension (30): WNL    Lumbar lateral flexion (25):  WNL WNL  Thoracic and Lumbar rotation (30 degrees):    WNL WNL  Hip Flexion (0-125):   WNL WNL  Hip IR (0-45):  WNL WNL  Hip ER (0-45):  WNL WNL  Hip Adduction:   WNL WNL  Hip Abduction (0-40):  WNL WNL  Hip extension (0-15):  WNL WNL  (*= pain, Blank rows =  not tested)   STRENGTH: MMT    RLE  LLE  Hip Flexion 4 4  Hip Extension 4 4  Hip Abduction  5 5  Hip Adduction     Hip ER  5 5  Hip IR   5 5  Knee Extension 5 5  Knee Flexion 4 4  Dorsiflexion     Plantarflexion (seated) 5 5  (*= pain, Blank rows = not tested)   SPECIAL TESTS:   FABER (SN 81): negative B FADIR (SN 94): negative B   PALPATION:  Abdominal:  Diastasis: none   Rib flare: present B RLQ: tenderness upon palpation more than the LLQ   L coccyx bony prominence - tender upon palpation - surrounding musculature (gluteals/PFM) R coccyx bony prominence - tender upon palpation   L4-S1 - hypomobile  SIJ bilaterally - hypomobile with increased pain   Coccyx can move into slight extension when asked to sustain IAP increase "bear down" with no pain    EXTERNAL PELVIC EXAM: Patient educated on the purpose of the pelvic exam and articulated understanding; patient consented to the exam verbally.  Breath coordination: present but inconsistent  Voluntary Contraction: present, 1/5 MMT Relaxation: delayed  Perineal movement with sustained IAP increase ("bear down"): descent Perineal movement with rapid IAP increase ("cough"): no change  Pubic symphysis: no tenderness  (0= no contraction, 1= flicker, 2= weak squeeze, 3= fair squeeze with lift, 4= good squeeze and lift against resistance, 5= strong squeeze against strong resistance)    TODAY'S TREATMENT:  Neuromuscular Re-education: Discussion on how increased coughing can make POP feel worsened due to increased IAP. Pt verbalized understanding. Splinting technique for coughing demonstrated with a pillow.   Standing wall planks, 3 x 30 sec hold, with coordinated breath for improved IAP management, VCs and TCs required   Pallof press, 2x10 B, RTB with coordinated breath for improved IAP management, VCs and TCs required   Standing wall squats with coordinated breath for improved IAP management, VCs and TCs required  Challenge: with UE abduction "T" and coordinated breath    Patient response to interventions: Pt with improved heaviness sensation at end of  session.   Patient Education:  Patient provided with HEP: wall planks, pallof press, wall squats. Patient educated throughout session on appropriate technique and form using multi-modal cueing, HEP, and activity modification. Patient will continue to benefit from further education in order to maximize compliance and understanding for long-term therapeutic gains.    ASSESSMENT:  Clinical Impression: Patient presents to clinic with excellent motivation to participate in today's treatment. Pt continues to demonstrate deficits in IAP management, PFM coordination, PFM strength, PFM endurance, LE strength, posture and pain. Pt reports occasional increase in descent of POP due to increased IAP from coughing. Pt with improvement in pain in the lower abdominals and pelvic region. Pt required significant VCs and TCs for TrA activation challenges in standing to decrease bodily compensations. Pt responded positively to active and educational interventions. Pt will continue to benefit from skilled therapeutic intervention to address remaining deficits in IAP management, PFM coordination, PFM strength, PFM endurance, LE strength, posture and pain in order to increase PLOF and improve overall QOL.   Objective Impairments: decreased activity tolerance, decreased coordination, decreased endurance, decreased strength, improper body mechanics, postural dysfunction, and pain.   Activity Limitations: carrying, lifting, sitting, standing, transfers, continence, toileting, locomotion level, and caring for others  Personal Factors: Age, Past/current experiences, Time since onset of injury/illness/exacerbation, and 1-2 comorbidities: prediabetic and migraines  are  also affecting patient's functional outcome.   Rehab Potential: Good  Clinical Decision Making: Evolving/moderate complexity  Evaluation Complexity: Moderate   GOALS: Goals reviewed with patient? Yes  SHORT TERM GOALS: Target date: 10/06/21  Patient  will decrease worst tailbone pain as reported on NPRS by at least 2 points to demonstrate clinically significant reduction in pain in order to restore/improve function and overall QOL. Baseline: 9/10; (9/28): 6/10  Goal status: MET   LONG TERM GOALS: Target date: 12/18/21  Patient will score  >/= 59 and 62 on FOTO Bowel Constipation/Urinary Problem in order to demonstrate improved PFM coordination, IAP management  and overall improved QOL.  Baseline: both 52, (9/6): 55 and 57   Goal status: IN PROGRESS  2.  Patient will report confidence in ability to control bladder > 6/10 in order to demonstrate improved function and ability to participate more fully in activities at home and in the community. Baseline: 3/10; (9/28): 7/10 Goal status: MET  3.  Patient will report less than 5 incidents of stress urinary incontinence over the course of 3 weeks while coughing/sneezing/physical activity/sexual activity in order to demonstrate improved PFM coordination, strength, and function for improved overall QOL. Baseline: leakage occurs every time with increased IAP; (9/28): 2x over past 3 weeks with sneezing Goal status: MET  4.  Patient will be able to articulate decreased pressure at the vaginal opening and demonstrate 3-5 postures that encourage gravity assisted repositioning of pelvic organs to decrease discomfort and pressure at end of day in order to participate more fully in activities at home and in the community.  Baseline: 8/10 pressure at the end of the day/feeling of "something" falling; (9/28): 5/10 and does not feel like "something" is dropping as much, has been better Goal status: MET  5.  Patient will demonstrate circumferential and sequential contraction of >4/5 MMT, > 6 sec hold x10 and 5 consecutive quick flicks with </= 10 min rest between testing bouts, and relaxation of the PFM coordinated with breath for improved management of intra-abdominal pressure and normal bowel and bladder  function without the presence of pain nor incontinence in order to improve participation at home and in the community. Baseline: will assess next visit, 1/5 MMT/no endurance tested (07/28/21) Goal status: IN PROGRESS    PLAN: PT Frequency: 1x/week  PT Duration: 12 weeks  Planned Interventions: Therapeutic exercises, Therapeutic activity, Neuromuscular re-education, Balance training, Gait training, Patient/Family education, Joint mobilization, Spinal mobilization, Cryotherapy, Moist heat, Taping, and Manual therapy  Plan For Next Session:  how was core? continue deep core on all fours, continue standing deep core (bands and lifting box maybe with weight)    Malayja Freund, PT, DPT  11/27/2021, 1:10 PM

## 2021-12-02 ENCOUNTER — Ambulatory Visit: Payer: BC Managed Care – PPO

## 2021-12-02 DIAGNOSIS — R278 Other lack of coordination: Secondary | ICD-10-CM

## 2021-12-02 DIAGNOSIS — M533 Sacrococcygeal disorders, not elsewhere classified: Secondary | ICD-10-CM

## 2021-12-02 DIAGNOSIS — M6289 Other specified disorders of muscle: Secondary | ICD-10-CM

## 2021-12-02 DIAGNOSIS — M6281 Muscle weakness (generalized): Secondary | ICD-10-CM

## 2021-12-02 NOTE — Therapy (Signed)
OUTPATIENT PHYSICAL THERAPY FEMALE PELVIC TREATMENT   Patient Name: Yesenia Davis MRN: [id number] DOB:07-Jun-1975, 46 y.o., female Today's Date: 12/02/2021   PT End of Session - 12/02/21 1523     Visit Number 13    Number of Visits 18    Date for PT Re-Evaluation 12/18/21    Authorization Type IE: 07/14/21; RC: 10/09/21 ; PN:11/04/21    PT Start Time 1530    PT Stop Time 1610    PT Time Calculation (min) 40 min    Activity Tolerance Patient tolerated treatment well             Past Medical History:  Diagnosis Date   Acne vulgaris    Epigastric pain    Gastritis    GERD (gastroesophageal reflux disease)    Gestational diabetes    Migraines    Pre-diabetes    Past Surgical History:  Procedure Laterality Date   CHOLECYSTECTOMY  1996   cyst removed     from neck- b9   DILATION AND CURETTAGE OF UTERUS     ESOPHAGOGASTRODUODENOSCOPY (EGD) WITH PROPOFOL N/A 09/03/2021   Procedure: ESOPHAGOGASTRODUODENOSCOPY (EGD) WITH PROPOFOL;  Surgeon: Jonathon Bellows, MD;  Location: [facility];  Service: Gastroenterology;  Laterality: N/A;   TUBAL LIGATION Bilateral 08/21/2020   Procedure: POST PARTUM TUBAL LIGATION;  Surgeon: Harlin Heys, MD;  Location: [facility] ORS;  Service: Gynecology;  Laterality: Bilateral;   Patient Active Problem List   Diagnosis Date Noted   Prolapsed uterus 10/23/2021   Pelvic pain 10/23/2021   Dyspareunia, female 10/23/2021   Nausea and vomiting, possible gastric outlet obstruction 09/03/2021   History of gestational diabetes 04/08/2021   Shingles outbreak 04/25/2020    PCP: Philip Aspen, CNM  REFERRING PROVIDER: Philip Aspen, CNM   REFERRING DIAG:  [phone] (ICD-10-CM) - Pelvic floor weakness   THERAPY DIAG:  Other lack of coordination  Sacrococcygeal disorders, not elsewhere classified  Muscle weakness (generalized)  Pelvic floor dysfunction  Rationale for Evaluation and Treatment: Rehabilitation  ONSET DATE: Over a year ago, but  last 3 months worse                                                                                                                                                                                         PRECAUTIONS: None  WEIGHT BEARING RESTRICTIONS: No  FALLS:  Has patient fallen in last 6 months? No  OCCUPATION/SOCIAL ACTIVITIES: Standing job, pressing socks   PLOF: Independent   CHIEF CONCERN: Pt has tried pelvic floor PT before for lower abdominal pain and some pressure felt at the vaginal opening, but not how Pt is feeling it today. Pt  has been feeling "what I think is my cervix" when I'm standing for long periods of time or walking. It feels like a tampon is hanging and is about to come out. Pt also feels "something hanging out" when she wipes but does not get in the way of urination. Pt has had to stop dancing because she feels like whatever is there is going to come out and down to the floor. Pt said the doctor that she went to did not tell her about a prolapse and when she examined her and asked to cough, the Dr did not say anything. Pt has noticed more bulging out of an organ in a squat position. During sexual relations, Pt occasionally has pain vaginally but it is described more as pressure days after penetrative sex. When the Pt sneezes or coughs, she does have urinary leakage. During her last menstrual cycle, Pt had a very sharp pain at the vagina and it took the breath out of her. She has not had a hx of very painful menstrual periods. Pt has taken Tylenol for the pain and it does aleve the pain when it arises. Pt has had a hx of buttock/tailbone pain, and Pt did have a fall when she was a little girl on the tailbone. Pt did not have any fractures, but she is unable to sit on hard surfaces without pain.     LIVING ENVIRONMENT: Lives with: lives with their family and lives with their spouse Lives in: House/apartment   PATIENT GOALS: Pt would like to not feel the pressure at the  end of the day, help with feeling in control of leakage     UROLOGICAL HISTORY Fluid intake: Yes: agua, soda every now and then    Pain with urination: No Fully empty bladder: No Stream: stop and go intermittent, strong  Urgency: Yes Frequency: 4x at work, 6x at home Nocturia: 3x Leakage: Coughing, Sneezing, Exercise, and Intercourse Pads: Yes Type: pantyliners to heavier flow  Amount: 2x/day but does not wear everyday  Bladder control (0-10): 3/10   GASTROINTESTINAL HISTORY  Pain with bowel movement: No Type of bowel movement:Type (Bristol Stool Scale) 4 Frequency: every other day  Fully empty rectum: No Leakage: No Pads: No   SEXUAL HISTORY/FUNCTION Pain with intercourse: No Ability to have vaginal penetration: Yes   OBSTETRICAL HISTORY Vaginal deliveries: G5P4 Tearing: Yes: with 2 children Currently pregnant: No  GYNECOLOGICAL HISTORY Hysterectomy: no, tubal ligation Pelvic Organ Prolapse: None, but has sxs  Heaviness/pressure: yes   SUBJECTIVE:  Pt has asked about payment for PFPT and what insurance covers. DPT gave [facility] billing dept and advised to call insurance to get an estimate.   PAIN:  Are you having pain? No NPRS scale:     OBJECTIVE: (07/28/21)   COGNITION: Overall cognitive status: Within functional limits for tasks assessed     POSTURE:  Iliac crest height: WNL Pelvic obliquity: WNL     RANGE OF MOTION:  (Norm range in degrees)  LEFT RIGHT  Lumbar forward flexion (65):  WNL    Lumbar extension (30): WNL    Lumbar lateral flexion (25):  WNL WNL  Thoracic and Lumbar rotation (30 degrees):    WNL WNL  Hip Flexion (0-125):   WNL WNL  Hip IR (0-45):  WNL WNL  Hip ER (0-45):  WNL WNL  Hip Adduction:   WNL WNL  Hip Abduction (0-40):  WNL WNL  Hip extension (0-15):  WNL WNL  (*= pain, Blank rows = not  tested)   STRENGTH: MMT    RLE  LLE  Hip Flexion 4 4  Hip Extension 4 4  Hip Abduction  5 5  Hip Adduction     Hip ER  5 5  Hip  IR  5 5  Knee Extension 5 5  Knee Flexion 4 4  Dorsiflexion     Plantarflexion (seated) 5 5  (*= pain, Blank rows = not tested)   SPECIAL TESTS:   FABER (SN 81): negative B FADIR (SN 94): negative B   PALPATION:  Abdominal:  Diastasis: none   Rib flare: present B RLQ: tenderness upon palpation more than the LLQ   L coccyx bony prominence - tender upon palpation - surrounding musculature (gluteals/PFM) R coccyx bony prominence - tender upon palpation   L4-S1 - hypomobile  SIJ bilaterally - hypomobile with increased pain   Coccyx can move into slight extension when asked to sustain IAP increase "bear down" with no pain    EXTERNAL PELVIC EXAM: Patient educated on the purpose of the pelvic exam and articulated understanding; patient consented to the exam verbally.  Breath coordination: present but inconsistent  Voluntary Contraction: present, 1/5 MMT Relaxation: delayed  Perineal movement with sustained IAP increase ("bear down"): descent Perineal movement with rapid IAP increase ("cough"): no change  Pubic symphysis: no tenderness  (0= no contraction, 1= flicker, 2= weak squeeze, 3= fair squeeze with lift, 4= good squeeze and lift against resistance, 5= strong squeeze against strong resistance)    TODAY'S TREATMENT:  Neuromuscular Re-education: Supine hooklying diaphragmatic breathing with VCs and TCs for downregulation of the nervous system and improved management of IAP  Supine 90/90 hold with coordinated breath, 3 x20 secs, for improved IAP management, VCs and TCs required  Added UE hold with breath for added challenge   Supine T's with 90/90 hold and coordinated breath, 2 x 20 secs, for improved IAP management, VCs and TCs required   Seated piriformis pose, x20 sec B, with coordinated breath for pain modulation, VCs and TCs required  Patient response to interventions: Pt comfortable to continue with PFPT till the end of the year    Patient Education:  Patient  provided with HEP: 90/90 hold with LE then UE, then supine T's with 90/90 hold. Patient educated throughout session on appropriate technique and form using multi-modal cueing, HEP, and activity modification. Patient will continue to benefit from further education in order to maximize compliance and understanding for long-term therapeutic gains.    ASSESSMENT:  Clinical Impression: Patient presents to clinic with excellent motivation to participate in today's treatment. Pt continues to demonstrate deficits in IAP management, PFM coordination, PFM strength, PFM endurance, LE strength, posture and pain. Pt has been feeling better this past week with decreased heaviness/pressure. Discussion continuing with PFPT every week vs every other week. Pt decided that every week continues to be ok until the end of the year. Pt required moderate VCs and TCs for TrA activation challenges in supine to decrease bodily compensations. Pt observed to have musculare fatigue after various repts. Pt responded positively to active and educational interventions. Pt will continue to benefit from skilled therapeutic intervention to address remaining deficits in IAP management, PFM coordination, PFM strength, PFM endurance, LE strength, posture and pain in order to increase PLOF and improve overall QOL.    Objective Impairments: decreased activity tolerance, decreased coordination, decreased endurance, decreased strength, improper body mechanics, postural dysfunction, and pain.   Activity Limitations: carrying, lifting, sitting, standing, transfers, continence, toileting, locomotion  level, and caring for others  Personal Factors: Age, Past/current experiences, Time since onset of injury/illness/exacerbation, and 1-2 comorbidities: prediabetic and migraines  are also affecting patient's functional outcome.   Rehab Potential: Good  Clinical Decision Making: Evolving/moderate complexity  Evaluation Complexity:  Moderate   GOALS: Goals reviewed with patient? Yes  SHORT TERM GOALS: Target date: 10/06/21  Patient will decrease worst tailbone pain as reported on NPRS by at least 2 points to demonstrate clinically significant reduction in pain in order to restore/improve function and overall QOL. Baseline: 9/10; (9/28): 6/10  Goal status: MET   LONG TERM GOALS: Target date: 12/18/21  Patient will score  >/= 59 and 62 on FOTO Bowel Constipation/Urinary Problem in order to demonstrate improved PFM coordination, IAP management  and overall improved QOL.  Baseline: both 52, (9/6): 55 and 57   Goal status: IN PROGRESS  2.  Patient will report confidence in ability to control bladder > 6/10 in order to demonstrate improved function and ability to participate more fully in activities at home and in the community. Baseline: 3/10; (9/28): 7/10 Goal status: MET  3.  Patient will report less than 5 incidents of stress urinary incontinence over the course of 3 weeks while coughing/sneezing/physical activity/sexual activity in order to demonstrate improved PFM coordination, strength, and function for improved overall QOL. Baseline: leakage occurs every time with increased IAP; (9/28): 2x over past 3 weeks with sneezing Goal status: MET  4.  Patient will be able to articulate decreased pressure at the vaginal opening and demonstrate 3-5 postures that encourage gravity assisted repositioning of pelvic organs to decrease discomfort and pressure at end of day in order to participate more fully in activities at home and in the community.  Baseline: 8/10 pressure at the end of the day/feeling of "something" falling; (9/28): 5/10 and does not feel like "something" is dropping as much, has been better Goal status: MET  5.  Patient will demonstrate circumferential and sequential contraction of >4/5 MMT, > 6 sec hold x10 and 5 consecutive quick flicks with </= 10 min rest between testing bouts, and relaxation of the PFM  coordinated with breath for improved management of intra-abdominal pressure and normal bowel and bladder function without the presence of pain nor incontinence in order to improve participation at home and in the community. Baseline: will assess next visit, 1/5 MMT/no endurance tested (07/28/21) Goal status: IN PROGRESS    PLAN: PT Frequency: 1x/week  PT Duration: 12 weeks  Planned Interventions: Therapeutic exercises, Therapeutic activity, Neuromuscular re-education, Balance training, Gait training, Patient/Family education, Joint mobilization, Spinal mobilization, Cryotherapy, Moist heat, Taping, and Manual therapy  Plan For Next Session:  continue with dead bug or standing all wall, continue deep core on all fours, continue standing deep core (bands and lifting box maybe with weight)    Heena Woodbury, PT, DPT  12/02/2021, 3:24 PM

## 2021-12-10 ENCOUNTER — Ambulatory Visit: Payer: BC Managed Care – PPO

## 2021-12-17 ENCOUNTER — Ambulatory Visit: Payer: BC Managed Care – PPO | Attending: Certified Nurse Midwife

## 2021-12-17 DIAGNOSIS — M6289 Other specified disorders of muscle: Secondary | ICD-10-CM | POA: Insufficient documentation

## 2021-12-17 DIAGNOSIS — M6281 Muscle weakness (generalized): Secondary | ICD-10-CM | POA: Insufficient documentation

## 2021-12-17 DIAGNOSIS — M533 Sacrococcygeal disorders, not elsewhere classified: Secondary | ICD-10-CM | POA: Diagnosis present

## 2021-12-17 DIAGNOSIS — R278 Other lack of coordination: Secondary | ICD-10-CM | POA: Insufficient documentation

## 2021-12-17 NOTE — Therapy (Signed)
OUTPATIENT PHYSICAL THERAPY FEMALE PELVIC TREATMENT   Patient Name: Yesenia Davis MRN: [id number] DOB:[birth year], 46 y.o., female Today's Date: 12/18/2021   PT End of Session - 12/17/21 1532     Visit Number 14    Number of Visits 28    Date for PT Re-Evaluation 02/25/22    Authorization Type IE: 07/14/21; RC: 10/09/21 ; PN:11/04/21; RC: 12/17/21    PT Start Time 1530    PT Stop Time 1610    PT Time Calculation (min) 40 min    Activity Tolerance Patient tolerated treatment well             Past Medical History:  Diagnosis Date   Acne vulgaris    Epigastric pain    Gastritis    GERD (gastroesophageal reflux disease)    Gestational diabetes    Migraines    Pre-diabetes    Past Surgical History:  Procedure Laterality Date   CHOLECYSTECTOMY  1996   cyst removed     from neck- b9   DILATION AND CURETTAGE OF UTERUS     ESOPHAGOGASTRODUODENOSCOPY (EGD) WITH PROPOFOL N/A 09/03/2021   Procedure: ESOPHAGOGASTRODUODENOSCOPY (EGD) WITH PROPOFOL;  Surgeon: Jonathon Bellows, MD;  Location: [facility] ENDOSCOPY;  Service: Gastroenterology;  Laterality: N/A;   TUBAL LIGATION Bilateral 08/21/2020   Procedure: POST PARTUM TUBAL LIGATION;  Surgeon: Harlin Heys, MD;  Location: [facility] ORS;  Service: Gynecology;  Laterality: Bilateral;   Patient Active Problem List   Diagnosis Date Noted   Prolapsed uterus 10/23/2021   Pelvic pain 10/23/2021   Dyspareunia, female 10/23/2021   Nausea and vomiting, possible gastric outlet obstruction 09/03/2021   History of gestational diabetes 04/08/2021   Shingles outbreak 04/25/2020    PCP: Philip Aspen, CNM  REFERRING PROVIDER: Philip Aspen, CNM   REFERRING DIAG:  [phone] (ICD-10-CM) - Pelvic floor weakness   THERAPY DIAG:  Other lack of coordination  Sacrococcygeal disorders, not elsewhere classified  Muscle weakness (generalized)  Pelvic floor dysfunction  Rationale for Evaluation and Treatment: Rehabilitation  ONSET DATE: Over a year  ago, but last 3 months worse                                                                                                                                                                                         PRECAUTIONS: None  WEIGHT BEARING RESTRICTIONS: No  FALLS:  Has patient fallen in last 6 months? No  OCCUPATION/SOCIAL ACTIVITIES: Standing job, pressing socks   PLOF: Independent   CHIEF CONCERN: Pt has tried pelvic floor PT before for lower abdominal pain and some pressure felt at the vaginal opening, but not how Pt is feeling it  today. Pt has been feeling "what I think is my cervix" when I'm standing for long periods of time or walking. It feels like a tampon is hanging and is about to come out. Pt also feels "something hanging out" when she wipes but does not get in the way of urination. Pt has had to stop dancing because she feels like whatever is there is going to come out and down to the floor. Pt said the doctor that she went to did not tell her about a prolapse and when she examined her and asked to cough, the Dr did not say anything. Pt has noticed more bulging out of an organ in a squat position. During sexual relations, Pt occasionally has pain vaginally but it is described more as pressure days after penetrative sex. When the Pt sneezes or coughs, she does have urinary leakage. During her last menstrual cycle, Pt had a very sharp pain at the vagina and it took the breath out of her. She has not had a hx of very painful menstrual periods. Pt has taken Tylenol for the pain and it does aleve the pain when it arises. Pt has had a hx of buttock/tailbone pain, and Pt did have a fall when she was a little girl on the tailbone. Pt did not have any fractures, but she is unable to sit on hard surfaces without pain.     LIVING ENVIRONMENT: Lives with: lives with their family and lives with their spouse Lives in: House/apartment   PATIENT GOALS: Pt would like to not feel the  pressure at the end of the day, help with feeling in control of leakage     UROLOGICAL HISTORY Fluid intake: Yes: agua, soda every now and then    Pain with urination: No Fully empty bladder: No Stream: stop and go intermittent, strong  Urgency: Yes Frequency: 4x at work, 6x at home Nocturia: 3x Leakage: Coughing, Sneezing, Exercise, and Intercourse Pads: Yes Type: pantyliners to heavier flow  Amount: 2x/day but does not wear everyday  Bladder control (0-10): 3/10   GASTROINTESTINAL HISTORY  Pain with bowel movement: No Type of bowel movement:Type (Bristol Stool Scale) 4 Frequency: every other day  Fully empty rectum: No Leakage: No Pads: No   SEXUAL HISTORY/FUNCTION Pain with intercourse: No Ability to have vaginal penetration: Yes   OBSTETRICAL HISTORY Vaginal deliveries: G5P4 Tearing: Yes: with 2 children Currently pregnant: No  GYNECOLOGICAL HISTORY Hysterectomy: no, tubal ligation Pelvic Organ Prolapse: None, but has sxs  Heaviness/pressure: yes   SUBJECTIVE:  Pt has been recovering from illness over the past 2 weeks as well as taking care of her kids that have been sick. She has not participated in HEP as much as she would have liked.   PAIN:  Are you having pain? No NPRS scale:     OBJECTIVE: (07/28/21)   COGNITION: Overall cognitive status: Within functional limits for tasks assessed     POSTURE:  Iliac crest height: WNL Pelvic obliquity: WNL     RANGE OF MOTION:  (Norm range in degrees)  LEFT RIGHT  Lumbar forward flexion (65):  WNL    Lumbar extension (30): WNL    Lumbar lateral flexion (25):  WNL WNL  Thoracic and Lumbar rotation (30 degrees):    WNL WNL  Hip Flexion (0-125):   WNL WNL  Hip IR (0-45):  WNL WNL  Hip ER (0-45):  WNL WNL  Hip Adduction:   WNL WNL  Hip Abduction (0-40):  WNL WNL  Hip extension (0-15):  WNL WNL  (*= pain, Blank rows = not tested)   STRENGTH: MMT    RLE  LLE  Hip Flexion 4 4  Hip Extension 4 4   Hip Abduction  5 5  Hip Adduction     Hip ER  5 5  Hip IR  5 5  Knee Extension 5 5  Knee Flexion 4 4  Dorsiflexion     Plantarflexion (seated) 5 5  (*= pain, Blank rows = not tested)   SPECIAL TESTS:   FABER (SN 81): negative B FADIR (SN 94): negative B   PALPATION:  Abdominal:  Diastasis: none   Rib flare: present B RLQ: tenderness upon palpation more than the LLQ   L coccyx bony prominence - tender upon palpation - surrounding musculature (gluteals/PFM) R coccyx bony prominence - tender upon palpation   L4-S1 - hypomobile  SIJ bilaterally - hypomobile with increased pain   Coccyx can move into slight extension when asked to sustain IAP increase "bear down" with no pain    EXTERNAL PELVIC EXAM: Patient educated on the purpose of the pelvic exam and articulated understanding; patient consented to the exam verbally.  Breath coordination: present but inconsistent  Voluntary Contraction: present, 1/5 MMT Relaxation: delayed  Perineal movement with sustained IAP increase ("bear down"): descent Perineal movement with rapid IAP increase ("cough"): no change  Pubic symphysis: no tenderness  (0= no contraction, 1= flicker, 2= weak squeeze, 3= fair squeeze with lift, 4= good squeeze and lift against resistance, 5= strong squeeze against strong resistance)    TODAY'S TREATMENT:  Neuromuscular Re-education: Reassessment of FOTO for Re-cert FOTO Urinary Problem - (9/6): 55,    Today: 59 (Goal is 62)  FOTO Bowel Constipation - (9/6): 59    Today: 67 (Goal MET)  Review of goals below - 2 goals continue to be IN PROGRESS  Discussion on continuing PFPT for improving IAP management and PFM strength/PFM endurance. Pt verbalized understanding and will like to continue until the end of the year with new provider.   Patient response to interventions: Pt comfortable to continue with PFPT till the end of the year    Patient Education:  Patient provided with HEP: no changes.  Patient educated throughout session on appropriate technique and form using multi-modal cueing, HEP, and activity modification. Patient will continue to benefit from further education in order to maximize compliance and understanding for long-term therapeutic gains.    ASSESSMENT:  Clinical Impression: Patient presents to clinic with excellent motivation to participate in today's recertification. Although Pt has demonstrated continued improvement since last re-cert in September, Pt continues to demonstrate deficits in IAP management, PFM strength, PFM endurance, and posture. Pt has met 4/6 goals listed below. Based on FOTO Bowel Constipation, Pt demonstrates significant improvement since 9/6 (55) to today (67) with minimal limitation or necessity to strain to have a BM. For FOTO Urinary Problem, Pt has made a 2 point improvement since 9/6 but reports having episodes of urinary incontinence with sneezing/coughing (especially as Pt has been recently ill) and occasional transitional movements. In relation to the Pt's Stage 2 prolapse, Pt has been feeling less pressure at the end of the day and during lifting/caring for younger children. Pt occasionally can notice "bulge" at vaginal opening but with no increased pain or discomfort. Despite Pt demonstrating improvements, Pt will continue to benefit from PFPT to address remaining deficits in relation to urinary incontinence in order to increase PLOF and improve overall QOL.  Objective Impairments: decreased activity tolerance, decreased coordination, decreased endurance, decreased strength, improper body mechanics, postural dysfunction, and pain.   Activity Limitations: carrying, lifting, sitting, standing, transfers, continence, toileting, locomotion level, and caring for others  Personal Factors: Age, Past/current experiences, Time since onset of injury/illness/exacerbation, and 1-2 comorbidities: prediabetic and migraines  are also affecting patient's  functional outcome.   Rehab Potential: Good  Clinical Decision Making: Evolving/moderate complexity  Evaluation Complexity: Moderate   GOALS: Goals reviewed with patient? Yes  SHORT TERM GOALS: Target date: 10/06/21  Patient will decrease worst tailbone pain as reported on NPRS by at least 2 points to demonstrate clinically significant reduction in pain in order to restore/improve function and overall QOL. Baseline: 9/10; (9/28): 6/10  Goal status: MET   LONG TERM GOALS: Target date: 02/25/2022  Patient will score  >/= 59 and 62 on FOTO Bowel Constipation/Urinary Problem in order to demonstrate improved PFM coordination, IAP management  and overall improved QOL.  Baseline: both 52, (9/6): 55 and 57; (12/6): 67 on Bowel Constipation, Urinary Problem 59  Goal status: IN PROGRESS  2.  Patient will report confidence in ability to control bladder > 6/10 in order to demonstrate improved function and ability to participate more fully in activities at home and in the community. Baseline: 3/10; (9/28): 7/10 Goal status: MET  3.  Patient will report less than 5 incidents of stress urinary incontinence over the course of 3 weeks while coughing/sneezing/physical activity/sexual activity in order to demonstrate improved PFM coordination, strength, and function for improved overall QOL. Baseline: leakage occurs every time with increased IAP; (9/28): 2x over past 3 weeks with sneezing Goal status: MET  4.  Patient will be able to articulate decreased pressure at the vaginal opening and demonstrate 3-5 postures that encourage gravity assisted repositioning of pelvic organs to decrease discomfort and pressure at end of day in order to participate more fully in activities at home and in the community.  Baseline: 8/10 pressure at the end of the day/feeling of "something" falling; (9/28): 5/10 and does not feel like "something" is dropping as much, has been better Goal status: MET  5.  Patient will  demonstrate circumferential and sequential contraction of >4/5 MMT, > 6 sec hold x10 and 5 consecutive quick flicks with </= 10 min rest between testing bouts, and relaxation of the PFM coordinated with breath for improved management of intra-abdominal pressure and normal bowel and bladder function without the presence of pain nor incontinence in order to improve participation at home and in the community. Baseline: will assess next visit, 1/5 MMT/no endurance tested (07/28/21) Goal status: IN PROGRESS    PLAN: PT Frequency: 1x/week  PT Duration: 12 weeks  Planned Interventions: Therapeutic exercises, Therapeutic activity, Neuromuscular re-education, Balance training, Gait training, Patient/Family education, Joint mobilization, Spinal mobilization, Cryotherapy, Moist heat, Taping, and Manual therapy  Plan For Next Session:   continue with full dead bug/bird dog or standing all wall, continue deep core on all fours, continue standing deep core (bands and lifting box maybe with weight)    Delsin Copen, PT, DPT  12/18/2021, 7:47 AM

## 2021-12-24 ENCOUNTER — Ambulatory Visit: Payer: BC Managed Care – PPO

## 2021-12-31 ENCOUNTER — Ambulatory Visit: Payer: BC Managed Care – PPO

## 2022-01-01 ENCOUNTER — Ambulatory Visit: Payer: BC Managed Care – PPO

## 2022-01-01 DIAGNOSIS — R278 Other lack of coordination: Secondary | ICD-10-CM

## 2022-01-01 DIAGNOSIS — M533 Sacrococcygeal disorders, not elsewhere classified: Secondary | ICD-10-CM

## 2022-01-01 DIAGNOSIS — M6281 Muscle weakness (generalized): Secondary | ICD-10-CM

## 2022-01-01 DIAGNOSIS — M6289 Other specified disorders of muscle: Secondary | ICD-10-CM

## 2022-01-01 NOTE — Therapy (Signed)
OUTPATIENT PHYSICAL THERAPY FEMALE PELVIC DISCHARGE   Patient Name: Yesenia Davis MRN: [id number] DOB:1975/11/29, 46 y.o., female Today's Date: 01/01/2022   PT End of Session - 01/01/22 1528     Visit Number 15    Number of Visits 28    Date for PT Re-Evaluation 02/25/22    Authorization Type IE: 07/14/21; RC: 10/09/21 ; PN:11/04/21; RC: 12/17/21    PT Start Time 1530    PT Stop Time 1610    PT Time Calculation (min) 40 min    Activity Tolerance Patient tolerated treatment well             Past Medical History:  Diagnosis Date   Acne vulgaris    Epigastric pain    Gastritis    GERD (gastroesophageal reflux disease)    Gestational diabetes    Migraines    Pre-diabetes    Past Surgical History:  Procedure Laterality Date   CHOLECYSTECTOMY  1996   cyst removed     from neck- b9   DILATION AND CURETTAGE OF UTERUS     ESOPHAGOGASTRODUODENOSCOPY (EGD) WITH PROPOFOL N/A 09/03/2021   Procedure: ESOPHAGOGASTRODUODENOSCOPY (EGD) WITH PROPOFOL;  Surgeon: Jonathon Bellows, MD;  Location: [facility] ENDOSCOPY;  Service: Gastroenterology;  Laterality: N/A;   TUBAL LIGATION Bilateral 08/21/2020   Procedure: POST PARTUM TUBAL LIGATION;  Surgeon: Harlin Heys, MD;  Location: [facility] ORS;  Service: Gynecology;  Laterality: Bilateral;   Patient Active Problem List   Diagnosis Date Noted   Prolapsed uterus 10/23/2021   Pelvic pain 10/23/2021   Dyspareunia, female 10/23/2021   Nausea and vomiting, possible gastric outlet obstruction 09/03/2021   History of gestational diabetes 04/08/2021   Shingles outbreak 04/25/2020    PCP: Philip Aspen, CNM  REFERRING PROVIDER: Philip Aspen, CNM   REFERRING DIAG:  [id number] (ICD-10-CM) - Pelvic floor weakness   THERAPY DIAG:  Other lack of coordination  Sacrococcygeal disorders, not elsewhere classified  Muscle weakness (generalized)  Pelvic floor dysfunction  Rationale for Evaluation and Treatment: Rehabilitation  ONSET DATE: Over a year  ago, but last 3 months worse                                                                                                                                                                                         PRECAUTIONS: None  WEIGHT BEARING RESTRICTIONS: No  FALLS:  Has patient fallen in last 6 months? No  OCCUPATION/SOCIAL ACTIVITIES: Standing job, pressing socks   PLOF: Independent   CHIEF CONCERN: Pt has tried pelvic floor PT before for lower abdominal pain and some pressure felt at the vaginal opening, but not how Pt is feeling it  today. Pt has been feeling "what I think is my cervix" when I'm standing for long periods of time or walking. It feels like a tampon is hanging and is about to come out. Pt also feels "something hanging out" when she wipes but does not get in the way of urination. Pt has had to stop dancing because she feels like whatever is there is going to come out and down to the floor. Pt said the doctor that she went to did not tell her about a prolapse and when she examined her and asked to cough, the Dr did not say anything. Pt has noticed more bulging out of an organ in a squat position. During sexual relations, Pt occasionally has pain vaginally but it is described more as pressure days after penetrative sex. When the Pt sneezes or coughs, she does have urinary leakage. During her last menstrual cycle, Pt had a very sharp pain at the vagina and it took the breath out of her. She has not had a hx of very painful menstrual periods. Pt has taken Tylenol for the pain and it does aleve the pain when it arises. Pt has had a hx of buttock/tailbone pain, and Pt did have a fall when she was a little girl on the tailbone. Pt did not have any fractures, but she is unable to sit on hard surfaces without pain.     LIVING ENVIRONMENT: Lives with: lives with their family and lives with their spouse Lives in: House/apartment   PATIENT GOALS: Pt would like to not feel the  pressure at the end of the day, help with feeling in control of leakage     UROLOGICAL HISTORY Fluid intake: Yes: agua, soda every now and then    Pain with urination: No Fully empty bladder: No Stream: stop and go intermittent, strong  Urgency: Yes Frequency: 4x at work, 6x at home Nocturia: 3x Leakage: Coughing, Sneezing, Exercise, and Intercourse Pads: Yes Type: pantyliners to heavier flow  Amount: 2x/day but does not wear everyday  Bladder control (0-10): 3/10   GASTROINTESTINAL HISTORY  Pain with bowel movement: No Type of bowel movement:Type (Bristol Stool Scale) 4 Frequency: every other day  Fully empty rectum: No Leakage: No Pads: No   SEXUAL HISTORY/FUNCTION Pain with intercourse: No Ability to have vaginal penetration: Yes   OBSTETRICAL HISTORY Vaginal deliveries: G5P4 Tearing: Yes: with 2 children Currently pregnant: No  GYNECOLOGICAL HISTORY Hysterectomy: no, tubal ligation Pelvic Organ Prolapse: None, but has sxs  Heaviness/pressure: yes   SUBJECTIVE:  Pt has been doing well and practicing HEP. Pt reports noticing some "dropping" of the prolapse after a busy day but not causing increased pressure/pain.   PAIN:  Are you having pain? No NPRS scale:     OBJECTIVE: (07/28/21)   COGNITION: Overall cognitive status: Within functional limits for tasks assessed     POSTURE:  Iliac crest height: WNL Pelvic obliquity: WNL     RANGE OF MOTION:  (Norm range in degrees)  LEFT RIGHT  Lumbar forward flexion (65):  WNL    Lumbar extension (30): WNL    Lumbar lateral flexion (25):  WNL WNL  Thoracic and Lumbar rotation (30 degrees):    WNL WNL  Hip Flexion (0-125):   WNL WNL  Hip IR (0-45):  WNL WNL  Hip ER (0-45):  WNL WNL  Hip Adduction:   WNL WNL  Hip Abduction (0-40):  WNL WNL  Hip extension (0-15):  WNL WNL  (*= pain, Blank rows =  not tested)   STRENGTH: MMT    RLE  LLE  Hip Flexion 4 4  Hip Extension 4 4  Hip Abduction  5 5  Hip  Adduction     Hip ER  5 5  Hip IR  5 5  Knee Extension 5 5  Knee Flexion 4 4  Dorsiflexion     Plantarflexion (seated) 5 5  (*= pain, Blank rows = not tested)   SPECIAL TESTS:   FABER (SN 81): negative B FADIR (SN 94): negative B   PALPATION:  Abdominal:  Diastasis: none   Rib flare: present B RLQ: tenderness upon palpation more than the LLQ   L coccyx bony prominence - tender upon palpation - surrounding musculature (gluteals/PFM) R coccyx bony prominence - tender upon palpation   L4-S1 - hypomobile  SIJ bilaterally - hypomobile with increased pain   Coccyx can move into slight extension when asked to sustain IAP increase "bear down" with no pain    EXTERNAL PELVIC EXAM: Patient educated on the purpose of the pelvic exam and articulated understanding; patient consented to the exam verbally.  Breath coordination: present but inconsistent  Voluntary Contraction: present, 1/5 MMT Relaxation: delayed  Perineal movement with sustained IAP increase ("bear down"): descent Perineal movement with rapid IAP increase ("cough"): no change  Pubic symphysis: no tenderness  (0= no contraction, 1= flicker, 2= weak squeeze, 3= fair squeeze with lift, 4= good squeeze and lift against resistance, 5= strong squeeze against strong resistance)    TODAY'S TREATMENT:  Neuromuscular Re-education: Reassessment of FOTO for Discharge FOTO Urinary Problem - (9/6): 55,    Today: 59 (Goal is 62), Today: 61 (Goal not met by one point)  FOTO Bowel Constipation - (9/6): 59    12/6: 67 (Goal MET), Today: 81 (Goal MET)   Review of goals below  Pt has met 4/6 goals below   Discussion on use of Poise Impressa to aid in the future for bladder leaks if the prolapse does progress. Also, discussion on continuing to monitor prolapse and follow-up for possible use of a pessary as Pt reports the "dropping" can at times be frustrating despite it not causing pain or discomfort.   Progression of TrA for  improved IAP management with coordinated breath, VCs and TCs required  Dead bug position with heel taps only  When ready to progress further - full dead bug  Bird dog with toe touching ground (given progression of fully extended leg when able)   Patient response to interventions: Pt comfortable to discharge.    Patient Education:  Patient provided with HEP: above TrA progression. Patient educated throughout session on appropriate technique and form using multi-modal cueing, HEP, and activity modification. Patient will continue to benefit from further education in order to maximize compliance and understanding for long-term therapeutic gains.    ASSESSMENT:  Clinical Impression: Patient presents to clinic with excellent motivation to participate in today's discharge session. Pt has demonstrated significant improvement since IE and last re-cert in September in relation to IAP management, PFM strength, PFM endurance, and posture. Based on FOTO Bowel Constipation, Pt demonstrates significant improvement since 9/6 (55) to today (81) with no difficulty passing a BM. For FOTO Urinary Problem, Pt has made a 2 point improvement since 12/6 and although did not reach goal of 62 (Today: 61), Pt reports no episodes of urinary incontinence with sneezing/coughing/transitional movements. 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 Pt occasionally can notice "bulge" at vaginal opening but with no increased pain or discomfort. Pt educated on [employer] and further challenges for IAP management. Pt verbalized understanding. Pt has met 4/6 goals listed below and is adequate for discharge.    Objective Impairments: decreased activity tolerance, decreased coordination, decreased endurance, decreased strength, improper body mechanics, postural dysfunction, and pain.   Activity  Limitations: carrying, lifting, sitting, standing, transfers, continence, toileting, locomotion level, and caring for others  Personal Factors: Age, Past/current experiences, Time since onset of injury/illness/exacerbation, and 1-2 comorbidities: prediabetic and migraines  are also affecting patient's functional outcome.   Rehab Potential: Good  Clinical Decision Making: Evolving/moderate complexity  Evaluation Complexity: Moderate   GOALS: Goals reviewed with patient? Yes  SHORT TERM GOALS: Target date: 10/06/21  Patient will decrease worst tailbone pain as reported on NPRS by at least 2 points to demonstrate clinically significant reduction in pain in order to restore/improve function and overall QOL. Baseline: 9/10; (9/28): 6/10  Goal status: MET   LONG TERM GOALS: Target date: 02/25/2022  Patient will score  >/= 59 and 62 on FOTO Bowel Constipation/Urinary Problem in order to demonstrate improved PFM coordination, IAP management  and overall improved QOL.  Baseline: both 52, (9/6): 55 and 57; (12/6): 67 on Bowel Constipation, Urinary Problem 59;   Goal status: NOT MET  2.  Patient will report confidence in ability to control bladder > 6/10 in order to demonstrate improved function and ability to participate more fully in activities at home and in the community. Baseline: 3/10; (9/28): 7/10 Goal status: MET  3.  Patient will report less than 5 incidents of stress urinary incontinence over the course of 3 weeks while coughing/sneezing/physical activity/sexual activity in order to demonstrate improved PFM coordination, strength, and function for improved overall QOL. Baseline: leakage occurs every time with increased IAP; (9/28): 2x over past 3 weeks with sneezing Goal status: MET  4.  Patient will be able to articulate decreased pressure at the vaginal opening and demonstrate 3-5 postures that encourage gravity assisted repositioning of pelvic organs to decrease discomfort and  pressure at end of day in order to participate more fully in activities at home and in the community.  Baseline: 8/10 pressure at the end of the day/feeling of "something" falling; (9/28): 5/10 and does not feel like "something" is dropping as much, has been better Goal status: MET  5.  Patient will demonstrate circumferential and sequential contraction of >4/5 MMT, > 6 sec hold x10 and 5 consecutive quick flicks with </= 10 min rest between testing bouts, and relaxation of the PFM coordinated with breath for improved management of intra-abdominal pressure and normal bowel and bladder function without the presence of pain nor incontinence in order to improve participation at home and in the community. Baseline: will assess next visit, 1/5 MMT/no endurance tested (07/28/21) Goal status: NOT MET    PLAN: PT Frequency: 1x/week  PT Duration: 12 weeks  Planned Interventions: Therapeutic exercises, Therapeutic activity, Neuromuscular re-education, Balance training, Gait training, Patient/Family education, Joint mobilization, Spinal mobilization, Cryotherapy, Moist heat, Taping, and Manual therapy    Rayne Loiseau, PT, DPT  01/01/2022, 4:01 PM

## 2022-01-07 ENCOUNTER — Ambulatory Visit: Payer: BC Managed Care – PPO

## 2022-06-24 ENCOUNTER — Other Ambulatory Visit: Payer: Self-pay | Admitting: Family Medicine

## 2022-06-24 ENCOUNTER — Encounter: Payer: Self-pay | Admitting: Family Medicine

## 2022-06-24 DIAGNOSIS — Z1231 Encounter for screening mammogram for malignant neoplasm of breast: Secondary | ICD-10-CM

## 2022-07-03 ENCOUNTER — Ambulatory Visit
Admission: RE | Admit: 2022-07-03 | Discharge: 2022-07-03 | Disposition: A | Discharge status: Home / Self Care | Payer: BC Managed Care – PPO | Source: Ambulatory Visit | Attending: Family Medicine | Admitting: Family Medicine

## 2022-07-03 DIAGNOSIS — Z1231 Encounter for screening mammogram for malignant neoplasm of breast: Secondary | ICD-10-CM | POA: Insufficient documentation

## 2022-07-12 IMAGING — US US OB COMP +14 WK
1 series · 13 of 28 positions shown · non-contrast
Comparison: none

CLINICAL DATA: Pregnancy.  Assess fetal anatomy.

EXAM:
OBSTETRICAL ULTRASOUND >14 WKS

[Series 1: us ob comp + 14 wk · 13 of 98 slices shown]
[im 4/98]
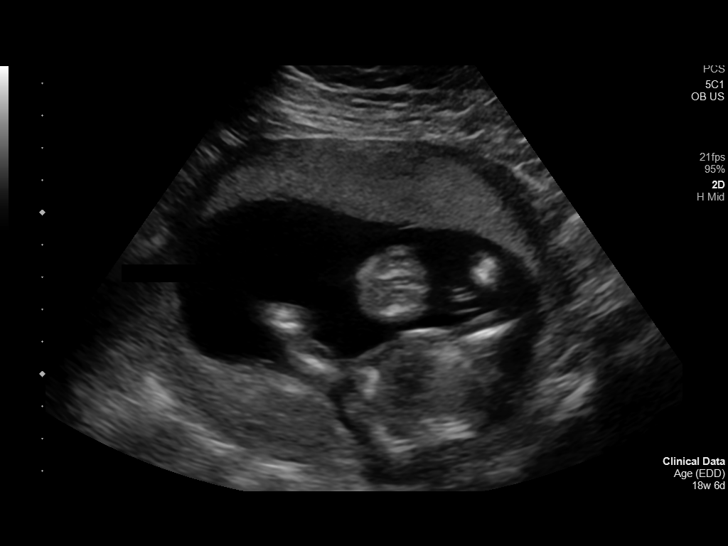
[im 11/98]
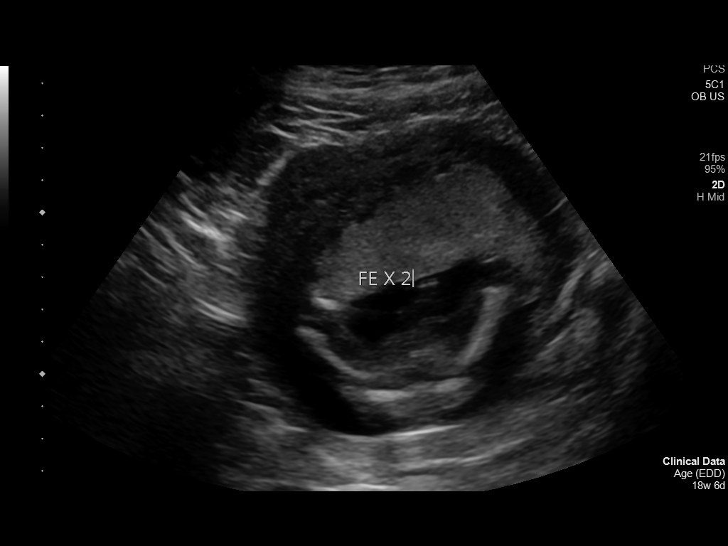
[im 18/98]
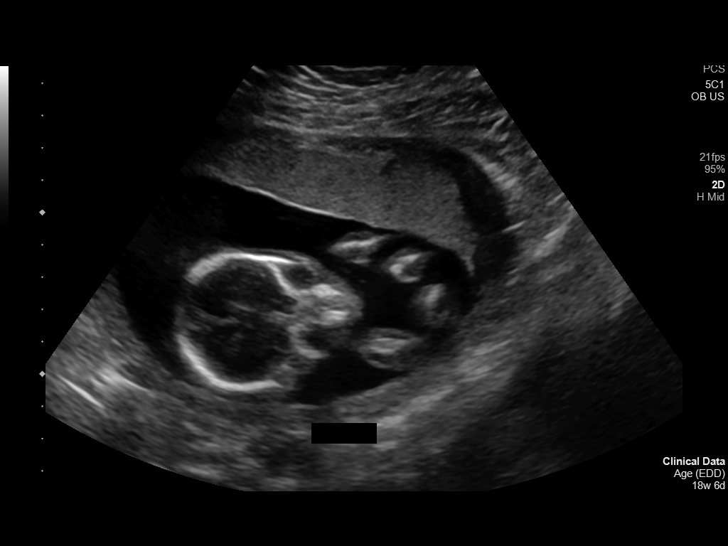
[im 26/98]
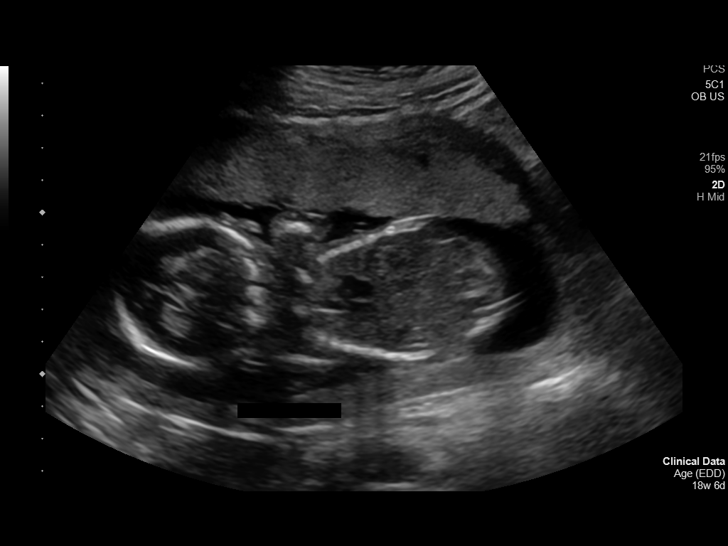
[im 33/98]
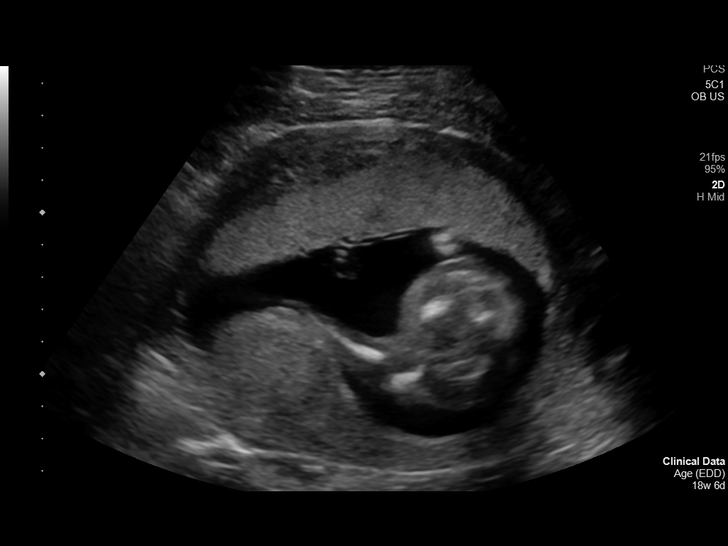
[im 40/98]
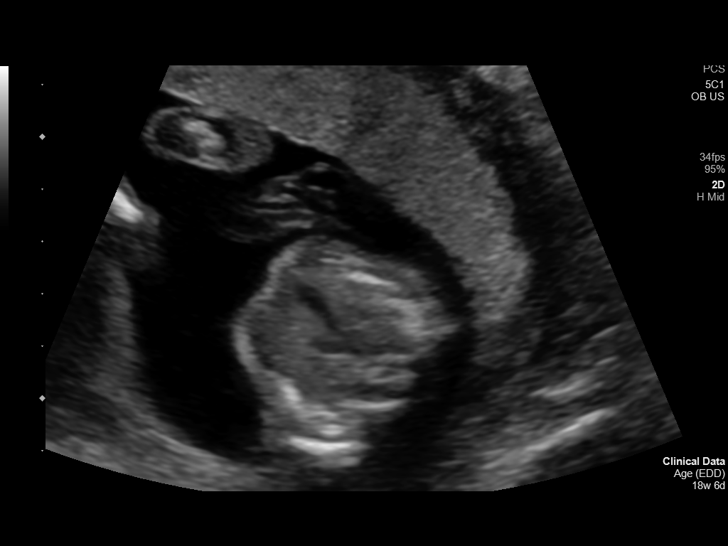
[im 51/98]
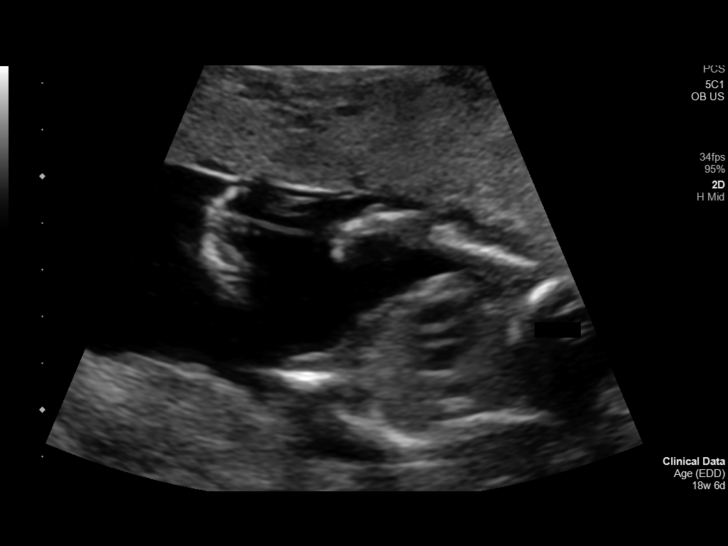
[im 58/98]
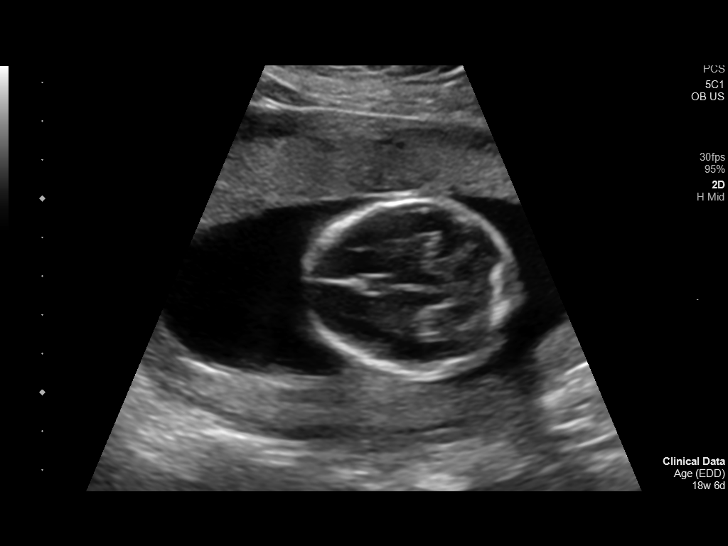
[im 65/98]
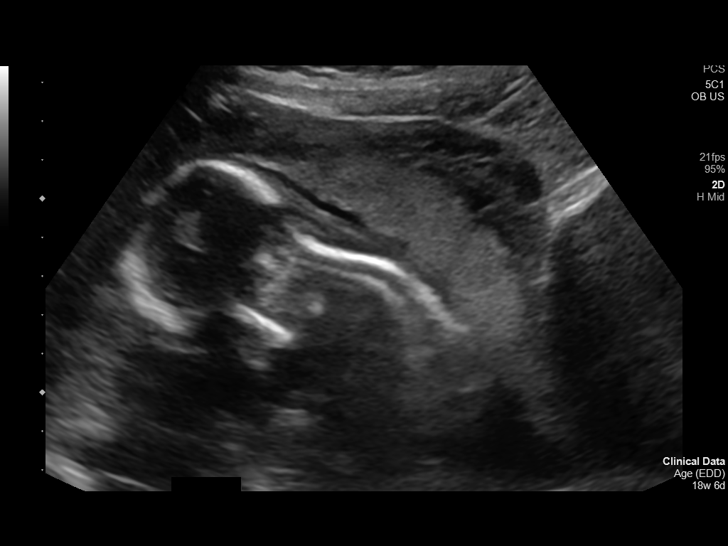
[im 72/98]
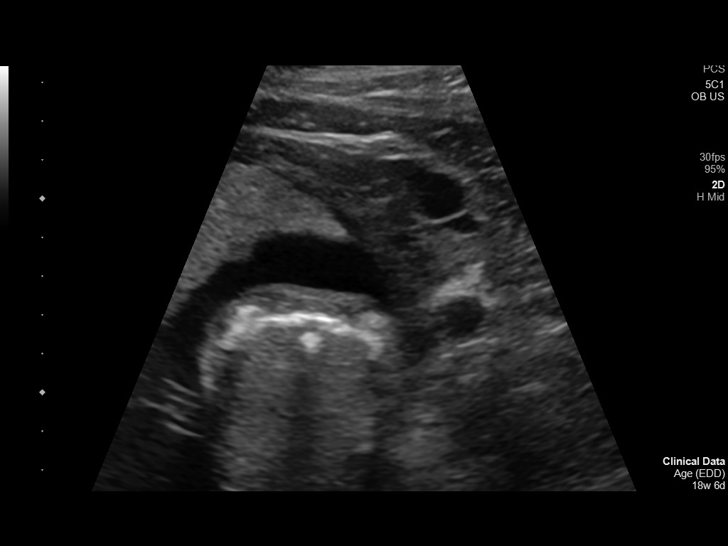
[im 80/98]
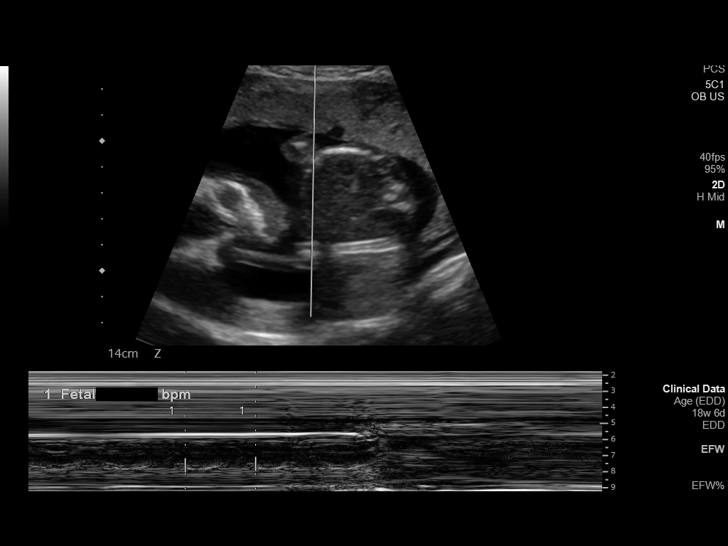
[im 87/98]
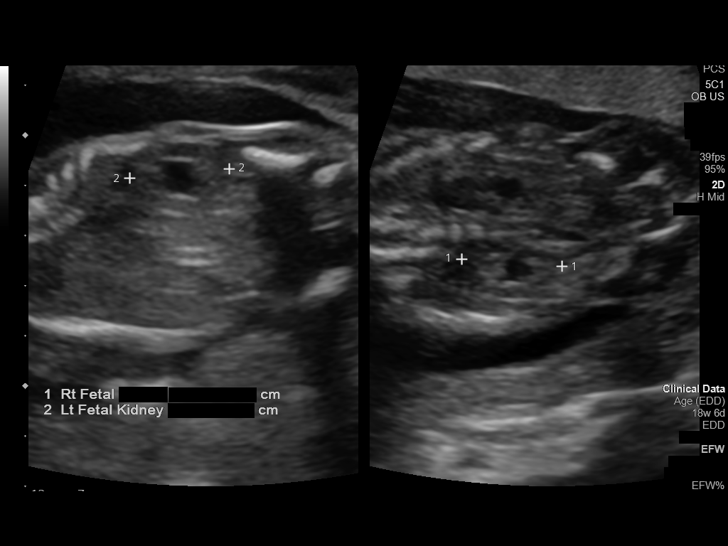
[im 94/98]
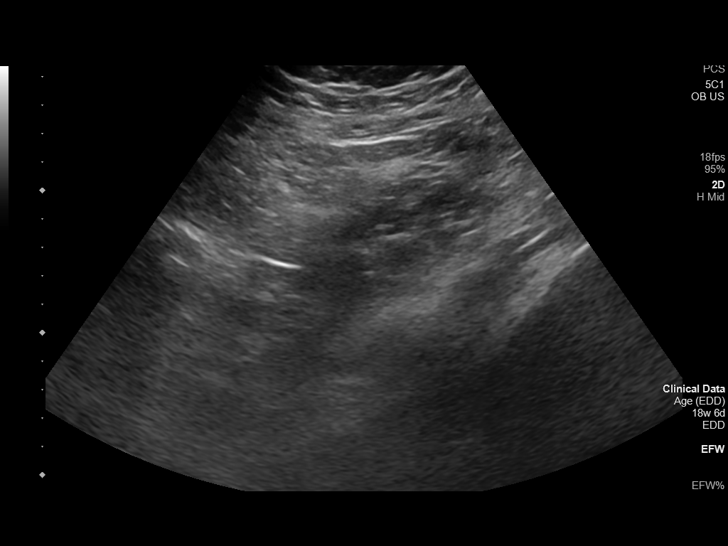

[13 of 28 positions shown; findings below may reference images not displayed]

FINDINGS: Number of Fetuses: 1

Heart Rate:  145 bpm

Movement: Yes

Presentation: Breech

Previa: No

Placental Location: Anterior. Placental tip measures 2.3 cm to the
internal cervical os.

Amniotic Fluid (Subjective): Normal

Amniotic Fluid (Objective):

Vertical pocket = 6.2cm

FETAL BIOMETRY

BPD: 4.5cm 19w 3d

HC:   16.4cm 19w 1d

AC:   13.3cm 18w 6d

FL:   2.7cm 18w 1d

Current Mean GA: 18w 6d US EDC: 08/22/2020

Assigned GA:  18w 6d Assigned EDC: 08/22/2020

Estimated Fetal Weight:  247g 30%ile

FETAL ANATOMY

Lateral Ventricles: Appears normal

Thalami/CSP: Appears normal

Posterior Fossa:  Appears normal

Nuchal Region: Appears normal   NFT= 2.8 mm

Upper Lip: Appears normal

Spine: Appears normal

4 Chamber Heart on Left: Appears normal

LVOT: Appears normal

RVOT: Appears normal

Stomach on Left: Appears normal

3 Vessel Cord: Appears normal

Cord Insertion site: Appears normal

Kidneys: Bilateral kidneys visualized. Left renal pelvis measures
4.7 mm in diameter, equivocal. Right renal pelvis measures 3.7 mm in
diameter, within normal limits.

Bladder: Appears normal

Extremities: Appears normal

Sex: Male

Technically difficult due to: None.

Maternal Findings:

Cervix:  Closed.  4.2 cm.
IMPRESSION: 1. Single live intrauterine gestation in breech presentation.
Assigned gestational age of 18 weeks 6 days. Adequate interval
growth.
2. Complete fetal anatomic survey, as above.
3. Borderline prominent left renal pelvis measuring 4.7 mm.
Attention on follow-up.

## 2022-09-12 IMAGING — US US MFM OB DETAIL+14 WK
1 series · 14 of 28 positions shown · non-contrast
Comparison: none

[Series 1: us mfm ob detail+14 wk · 87 acquisitions, 14 frames shown]
[im 4/87]
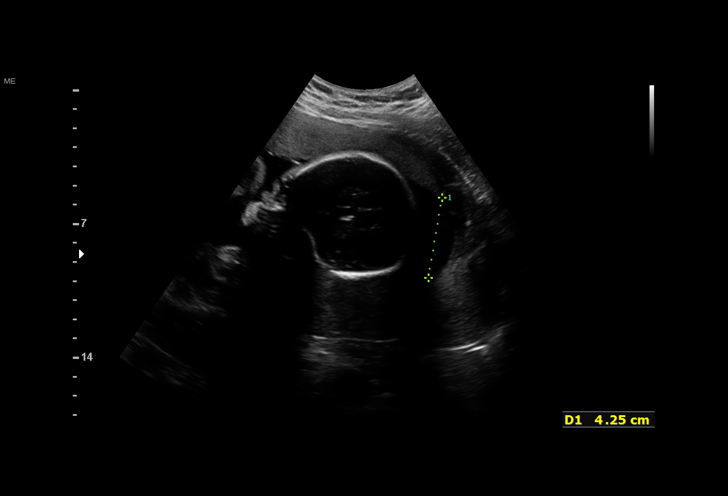
[im 10/87]
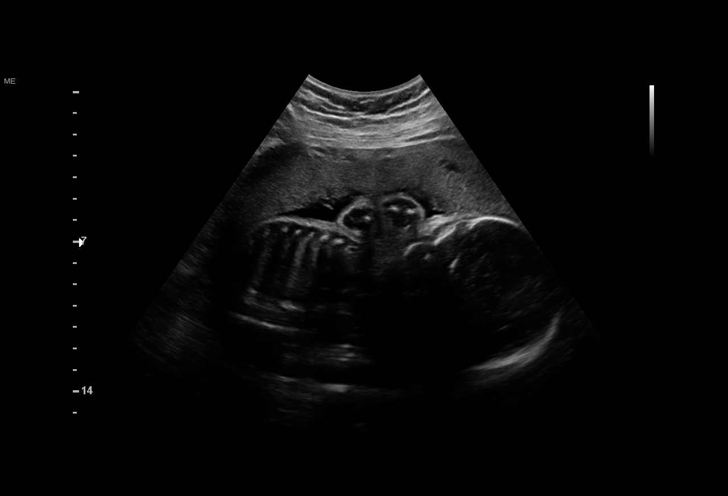
[im 16/87]
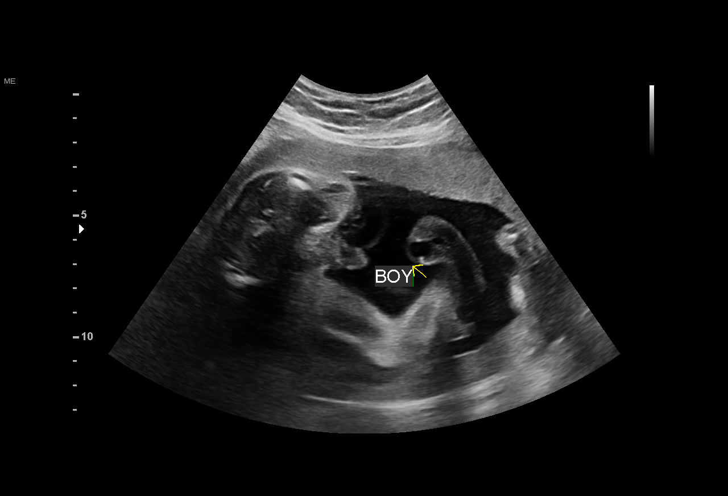
[im 23/87]
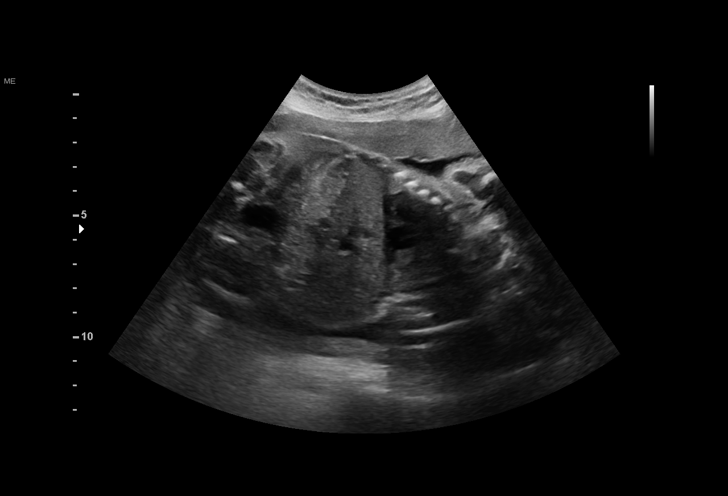
[im 29/87]
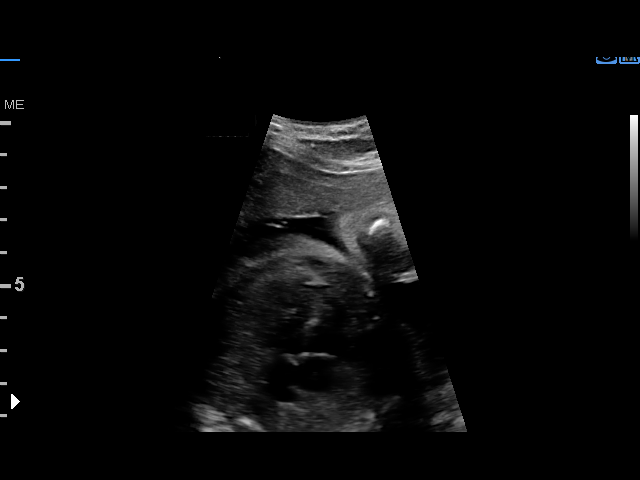
[im 36/87]
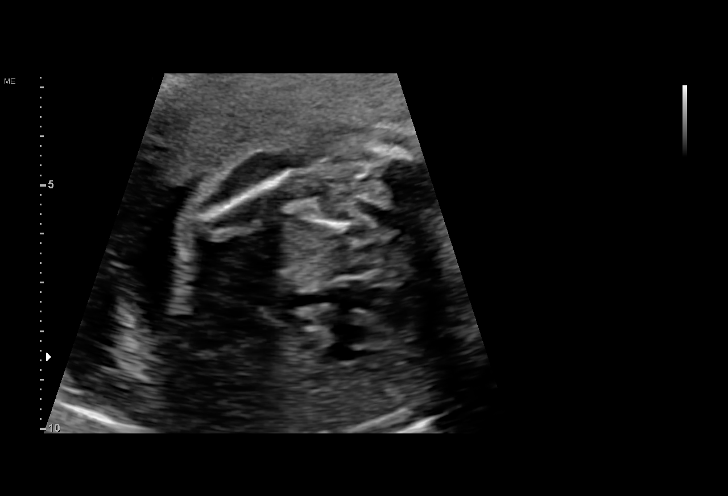
[im 42/87]
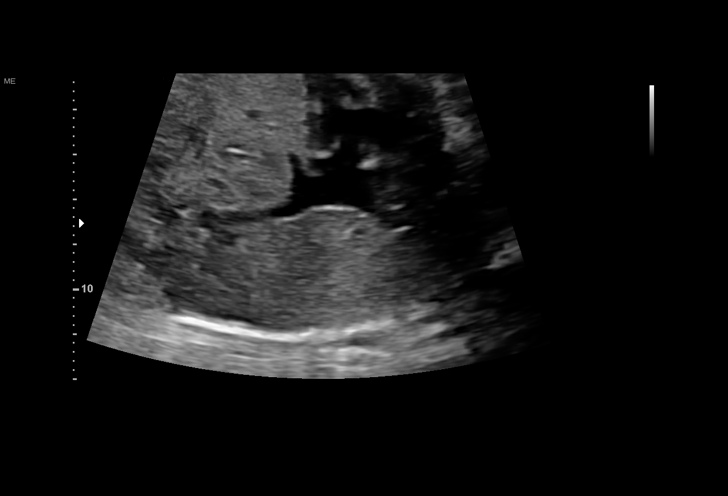
[im 48/87]
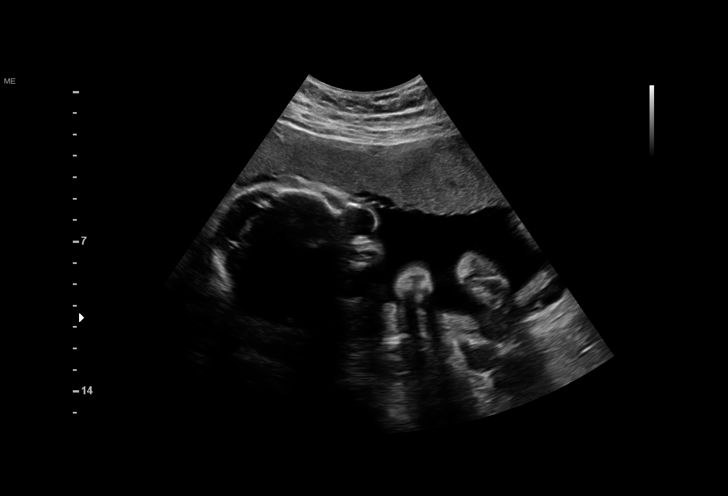
[im 55/87]
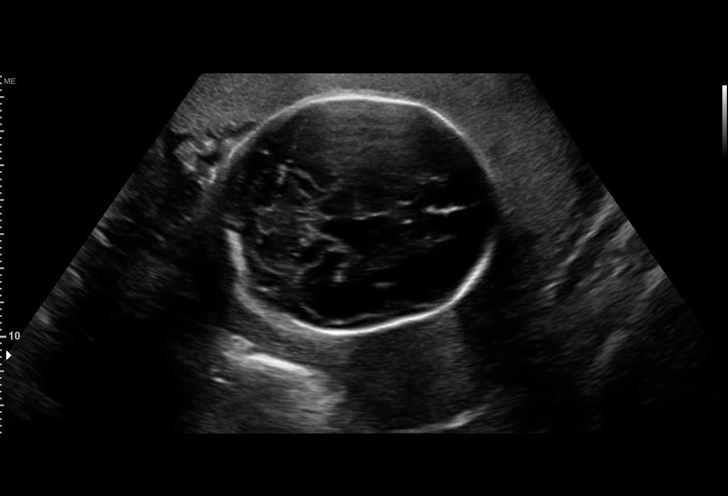
[im 61/87]
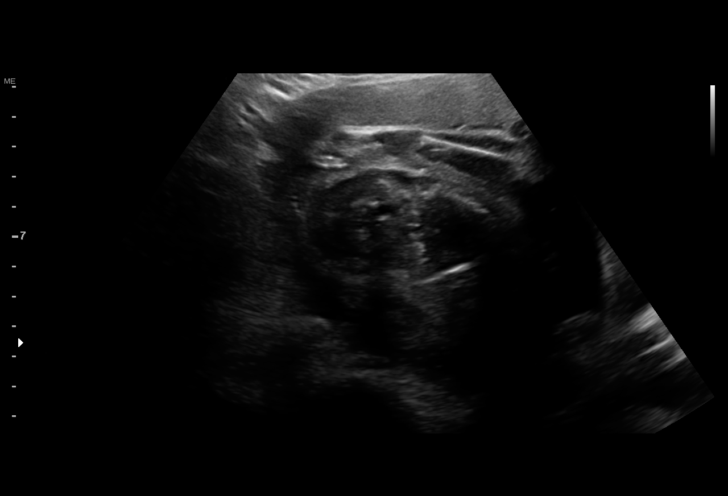
[im 67/87]
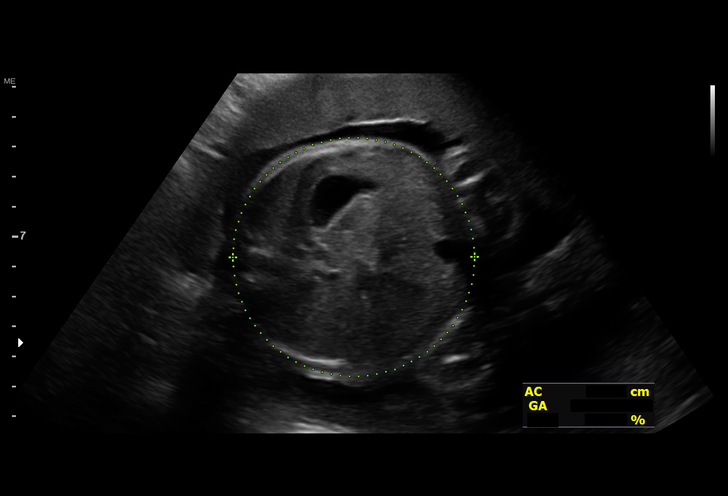
[im 74/87]
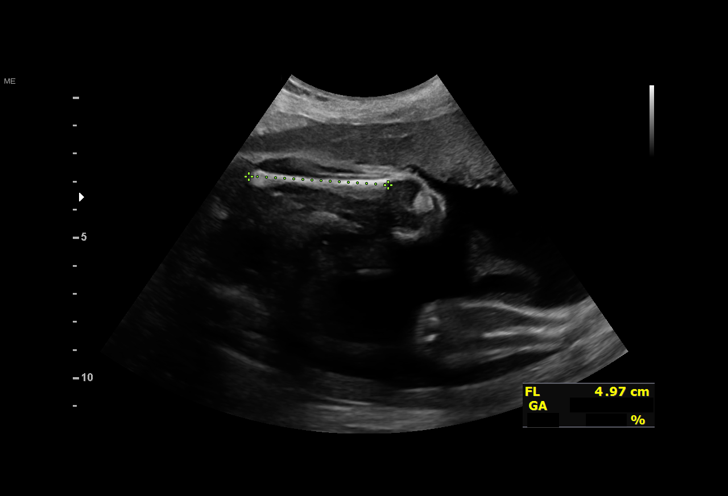
[im 80/87]
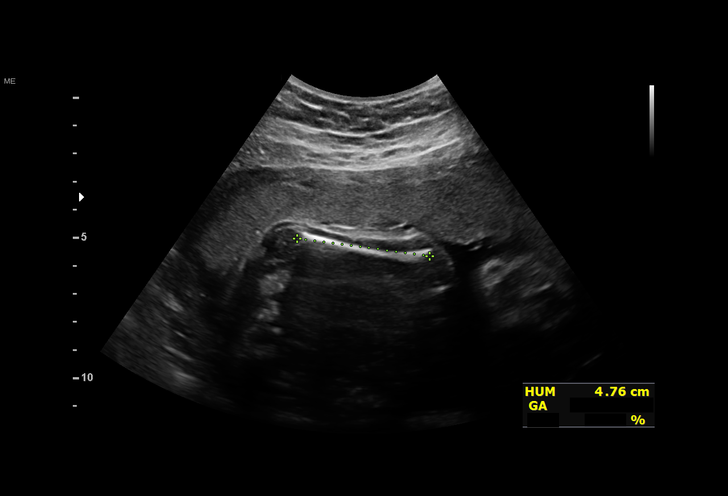
[im 87/87]
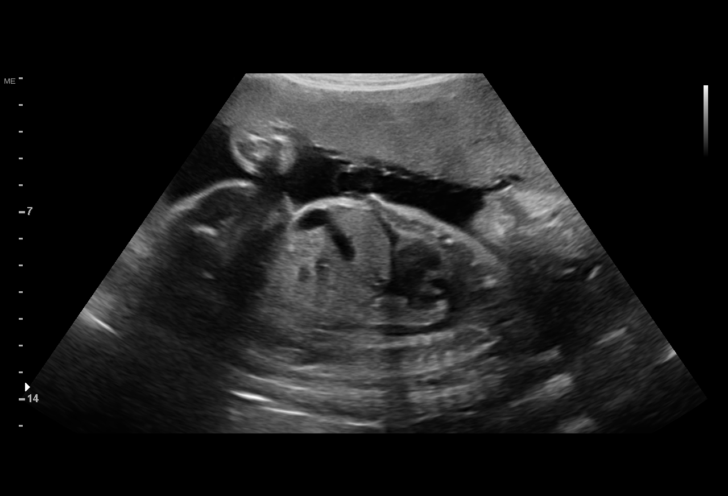

[14 of 28 positions shown; findings below may reference images not displayed]

[REDACTED] at
                   MARIA BRUNA CNM

Indications

 27 weeks gestation of pregnancy
 Advanced maternal age multigravida 35+,
 second trimester
 Pyelectasis of fetus on prenatal ultrasound
 Gestational diabetes in pregnancy,
 controlled by oral hypoglycemic drugs
Fetal Evaluation

 Num Of Fetuses:         1
 Fetal Heart Rate(bpm):  136
 Cardiac Activity:       Observed
 Presentation:           Cephalic
 Placenta:               Anterior
 P. Cord Insertion:      Visualized, central

 AFI Sum(cm)     %Tile       Largest Pocket(cm)
 15.2            53          6

 RUQ(cm)       RLQ(cm)       LUQ(cm)        LLQ(cm)
 6
Biometry

 BPD:      69.1  mm     G. Age:  27w 5d         45  %    CI:         73.1   %    70 - 86
                                                         FL/HC:      19.3   %    18.8 -
 HC:      256.9  mm     G. Age:  27w 6d         31  %    HC/AC:      1.05        1.05 -
 AC:      244.4  mm     G. Age:  28w 5d         76  %    FL/BPD:     71.9   %    71 - 87
 FL:       49.7  mm     G. Age:  26w 5d         15  %    FL/AC:      20.3   %    20 - 24
 HUM:      48.1  mm     G. Age:  28w 1d         61  %
 CER:      31.2  mm     G. Age:  27w 0d         41  %
 LV:        3.7  mm
 CM:          5  mm

 Est. FW:    3393  gm      2 lb 8 oz     51  %
Gestational Age

 LMP:           28w 1d        Date:  11/13/19                 EDD:   08/19/20
 U/S Today:     27w 5d                                        EDD:   08/22/20
 Best:          27w 4d     Det. By:  Early Ultrasound         EDD:   08/23/20
                                     (02/20/20)
Anatomy

 Cranium:               Appears normal         LVOT:                   Appears normal
 Cavum:                 Appears normal         Aortic Arch:            Appears normal
 Ventricles:            Appears normal         Ductal Arch:            Appears normal
 Choroid Plexus:        Appears normal         Diaphragm:              Appears normal
 Cerebellum:            Appears normal         Stomach:                Appears normal, left
                                                                       sided
 Posterior Fossa:       Appears normal         Abdomen:                Appears normal
 Nuchal Fold:           Appears normal         Abdominal Wall:         Appears nml (cord
                                                                       insert, abd wall)
 Face:                  Appears normal         Cord Vessels:           Appears normal (3
                        (orbits and profile)                           vessel cord)
 Lips:                  Appears normal         Kidneys:                Bilat pyelectasis,
                                                                       Rt 4.7, Lt
 Palate:                Not well visualized    Bladder:                Appears normal
 Thoracic:              Appears normal         Spine:                  Appears normal
 Heart:                 Appears normal         Upper Extremities:      Appears normal
                        (4CH, axis, and
                        situs)
 RVOT:                  Appears normal         Lower Extremities:      Appears normal
Cervix Uterus Adnexa

 Cervix
 Length:           3.52  cm.

## 2023-03-01 ENCOUNTER — Other Ambulatory Visit: Payer: Self-pay

## 2023-03-01 ENCOUNTER — Emergency Department
Admission: EM | Admit: 2023-03-01 | Discharge: 2023-03-01 | Disposition: A | Discharge status: Home / Self Care | Payer: BC Managed Care – PPO | Attending: Emergency Medicine | Admitting: Emergency Medicine

## 2023-03-01 ENCOUNTER — Emergency Department: Payer: BC Managed Care – PPO

## 2023-03-01 DIAGNOSIS — M549 Dorsalgia, unspecified: Secondary | ICD-10-CM

## 2023-03-01 DIAGNOSIS — R0602 Shortness of breath: Secondary | ICD-10-CM | POA: Diagnosis present

## 2023-03-01 DIAGNOSIS — U071 COVID-19: Secondary | ICD-10-CM | POA: Insufficient documentation

## 2023-03-01 DIAGNOSIS — M546 Pain in thoracic spine: Secondary | ICD-10-CM | POA: Diagnosis not present

## 2023-03-01 LAB — BASIC METABOLIC PANEL
Anion gap: 9 (ref 5–15)
BUN: 18 mg/dL (ref 6–20)
CO2: 29 mmol/L (ref 22–32)
Calcium: 9.4 mg/dL (ref 8.9–10.3)
Chloride: 100 mmol/L (ref 98–111)
Creatinine, Ser: 0.61 mg/dL (ref 0.44–1.00)
GFR, Estimated: >60 mL/min (ref >60)
Glucose, Bld: 104 mg/dL — ABNORMAL HIGH (ref 70–99)
Potassium: 3.3 mmol/L — ABNORMAL LOW (ref 3.5–5.1)
Sodium: 138 mmol/L (ref 135–145)

## 2023-03-01 LAB — CBC
HCT: 43 % (ref 36.0–46.0)
Hemoglobin: 13.8 g/dL (ref 12.0–15.0)
MCH: 28.8 pg (ref 26.0–34.0)
MCHC: 32.1 g/dL (ref 30.0–36.0)
MCV: 89.6 fL (ref 80.0–100.0)
Platelets: 353 10*3/uL (ref 150–400)
RBC: 4.8 MIL/uL (ref 3.87–5.11)
RDW: 12.8 % (ref 11.5–15.5)
WBC: 9.6 10*3/uL (ref 4.0–10.5)
nRBC: 0 % (ref 0.0–0.2)

## 2023-03-01 LAB — TROPONIN I (HIGH SENSITIVITY)
Troponin I (High Sensitivity): 3 ng/L (ref <18)
Troponin I (High Sensitivity): 3 ng/L (ref <18)

## 2023-03-01 MED ORDER — ALBUTEROL SULFATE HFA 108 (90 BASE) MCG/ACT IN AERS: 2.0000 | INHALATION_SPRAY | Freq: Four times a day (QID) | RESPIRATORY_TRACT | 0 refills | Status: AC | PRN

## 2023-03-01 NOTE — Discharge Instructions (Addendum)
Your exam, labs, and EKG are normal and reassuring at this time.  No signs of a serious infection or injury to the heart.  Your symptoms are likely due to muscle strain and fatigue due to COVID.  Take the previously prescribed medications along with the albuterol inhaler as needed.  Follow-up with your primary provider or return to the ED if needed.

## 2023-03-01 NOTE — ED Notes (Signed)
See triage notes. Patient c/o chest pain for the past five to six days. Patient recently had COVID and has been having shortness of breath since.

## 2023-03-01 NOTE — ED Triage Notes (Signed)
Pt sts that she has been having chest pain for the last 5-6 days. Pt sts that she recently had COVID and has been having SOB since than also. Pt sts that the UC called her and advised her to come and be seen as she also has fluid on her chest.

## 2023-03-01 NOTE — ED Provider Notes (Signed)
[facility] Emergency Department Provider Note     Event Date/Time   First MD Initiated Contact with Patient 03/01/23 1746     (approximate)   History   Chest Pain   HPI  Yesenia Davis is a 48 y.o. female with a history of GERD, migraines, and recent diagnosis of COVID, presents to the ED for evaluation of shortness of breath.  She reports symptoms have been persistent since her diagnosis.  She went to the urgent care and was advised to report to the ED for evaluation.  She is currently on a course of Augmentin, steroid, Z-Pak, and promethazine syrup.  She would endorse some pain to the left scapulothoracic muscle region, aggravated by movement.  No frank chest pain or shortness of breath.  She denies any recent fevers chills or sweats.  Denies any persistent cough with this COVID strain.  Physical Exam   Triage Vital Signs: ED Triage Vitals  Encounter Vitals Group     BP 03/01/23 1252 119/83     Systolic BP Percentile --      Diastolic BP Percentile --      Pulse Rate 03/01/23 1252 71     Resp 03/01/23 1252 17     Temp 03/01/23 1252 98.1 F (36.7 C)     Temp Source 03/01/23 1252 Oral     SpO2 03/01/23 1252 100 %     Weight 03/01/23 1307 144 lb (65.3 kg)     Height 03/01/23 1307 5\' 4"  (1.626 m)     Head Circumference --      Peak Flow --      Pain Score 03/01/23 1307 7     Pain Loc --      Pain Education --      Exclude from Growth Chart --     Most recent vital signs: Vitals:   03/01/23 1252 03/01/23 1737  BP: 119/83 116/83  Pulse: 71 68  Resp: 17 17  Temp: 98.1 F (36.7 C) 98.1 F (36.7 C)  SpO2: 100% 100%    General Awake, no distress. NAD HEENT NCAT. PERRL. EOMI. No rhinorrhea. Mucous membranes are moist.  CV:  Good peripheral perfusion. RRR RESP:  Normal effort. CTA ABD:  No distention.  MSK:  Normal spinal alignment without midline tenderness, spasm, deformity, or step-off.  Active range of motion of the obturators  bilaterally.  Reproducible pain to palpation over the left scapulothoracic musculoskeletal region.  ED Results / Procedures / Treatments   Labs (all labs ordered are listed, but only abnormal results are displayed) Labs Reviewed  BASIC METABOLIC PANEL - Abnormal; Notable for the following components:      Result Value   Potassium 3.3 (*)    Glucose, Bld 104 (*)    All other components within normal limits  CBC  POC URINE PREG, ED  TROPONIN I (HIGH SENSITIVITY)  TROPONIN I (HIGH SENSITIVITY)     EKG  Vent. rate 74 BPM PR interval 128 ms QRS duration 86 ms QT/QTcB 368/408 ms P-R-T axes 28 73 31 Normal sinus rhythm Normal ECG When compared with ECG of 22-Dec-2017 21:35, Nonspecific T wave abnormality now evident in Anterior leads No STEMI  RADIOLOGY  I personally viewed and evaluated these images as part of my medical decision making, as well as reviewing the written report by the radiologist.  ED Provider Interpretation: No acute findings  DG Chest 2 View Result Date: 03/01/2023 CLINICAL DATA:  Chest pain short of breath EXAM:  CHEST - 2 VIEW COMPARISON:  12/22/2017 FINDINGS: The heart size and mediastinal contours are within normal limits. Both lungs are clear. The visualized skeletal structures are unremarkable. IMPRESSION: No active cardiopulmonary disease. Electronically Signed   By: Jasmine Pang M.D.   On: 03/01/2023 16:10     PROCEDURES:  Critical Care performed: No  Procedures   MEDICATIONS ORDERED IN ED: Medications - No data to display   IMPRESSION / MDM / ASSESSMENT AND PLAN / ED COURSE  I reviewed the triage vital signs and the nursing notes.                              Differential diagnosis includes, but is not limited to, ACS, aortic dissection, pulmonary embolism, cardiac tamponade, pneumothorax, pneumonia, pericarditis, myocarditis, GI-related causes including esophagitis/gastritis, and musculoskeletal chest wall pain.    Patient's  presentation is most consistent with acute complicated illness / injury requiring diagnostic workup.  Patient's diagnosis is consistent with persistent fatigue and malaise secondary to COVID.  Patient also with reproducible scapulothoracic muscle pain on the left.  She has a reassuring cardiac workup at this time.  No evidence of any acute intrathoracic process, based on interpretation of x-ray images.  Troponin is normal, with a low concern for ACS given the patient's 5 to 6 days worth of symptoms.  EKG does not show malignant arrhythmia.  Labs without any acute abnormalities.  Patient will be discharged home with prescriptions for albuterol MDI. Patient is to follow up with her primary provider as discussed, as needed or otherwise directed. Patient is given ED precautions to return to the ED for any worsening or new symptoms.  FINAL CLINICAL IMPRESSION(S) / ED DIAGNOSES   Final diagnoses:  COVID  Musculoskeletal back pain     Rx / DC Orders   ED Discharge Orders          Ordered    albuterol (VENTOLIN HFA) 108 (90 Base) MCG/ACT inhaler  Every 6 hours PRN        03/01/23 1803             Note:  This document was prepared using Dragon voice recognition software and may include unintentional dictation errors.    Lissa Hoard, PA-C 03/01/23 Merrily Brittle    Jene Every, MD 03/01/23 [phone]

## 2023-09-02 ENCOUNTER — Encounter: Payer: Self-pay | Admitting: Family Medicine

## 2023-09-02 DIAGNOSIS — Z1231 Encounter for screening mammogram for malignant neoplasm of breast: Secondary | ICD-10-CM

## 2023-09-20 IMAGING — MG MM DIGITAL SCREENING BILAT W/ TOMO AND CAD
6 of 10 series · 6 of 30 positions shown · non-contrast
Comparison: Previous exam(s).

CLINICAL DATA: Screening.

EXAM:
DIGITAL SCREENING BILATERAL MAMMOGRAM WITH TOMOSYNTHESIS AND CAD
TECHNIQUE: Bilateral screening digital craniocaudal and mediolateral oblique
mammograms were obtained. Bilateral screening digital breast
tomosynthesis was performed. The images were evaluated with
computer-aided detection.

[R CC synth-2D]
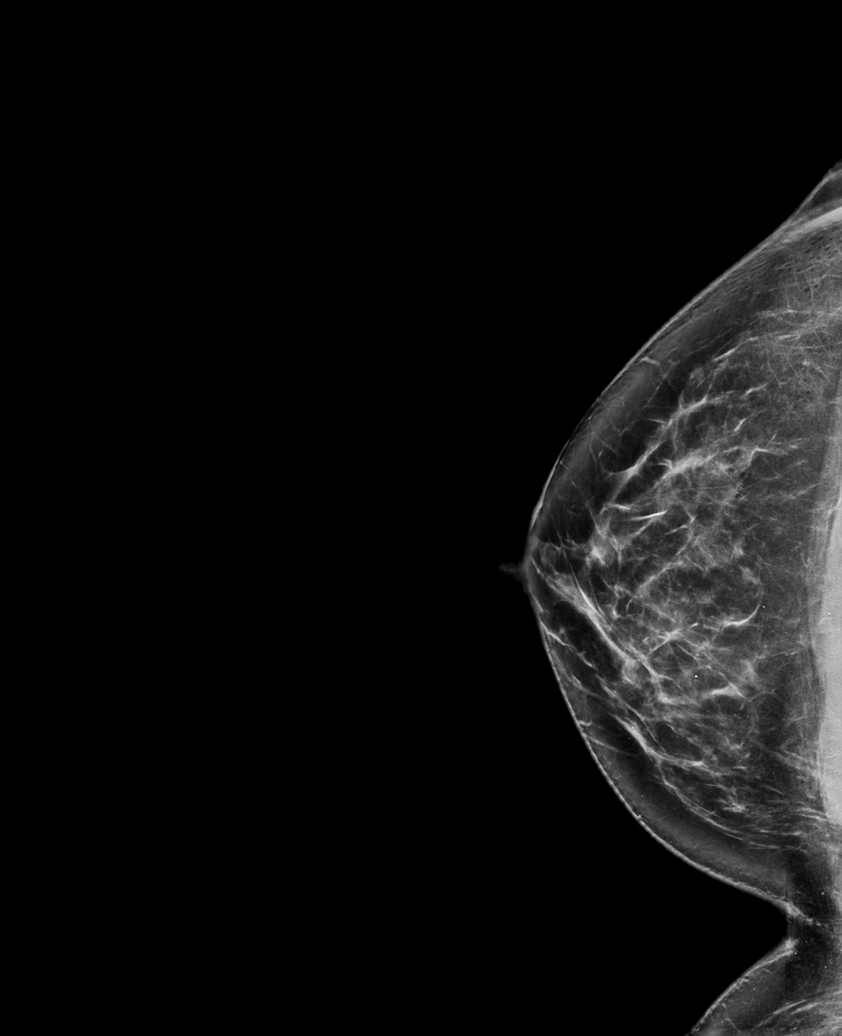

[L MLO synth-2D (1 of 2)]
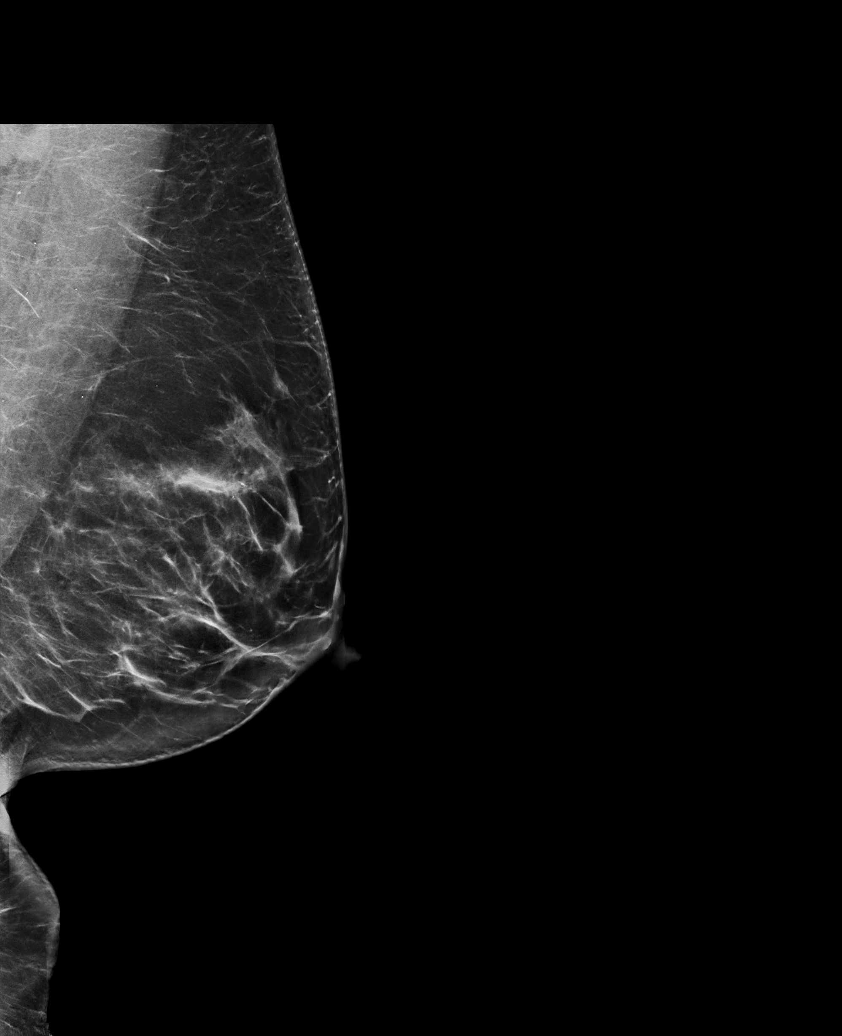

[L MLO synth-2D (2 of 2)]
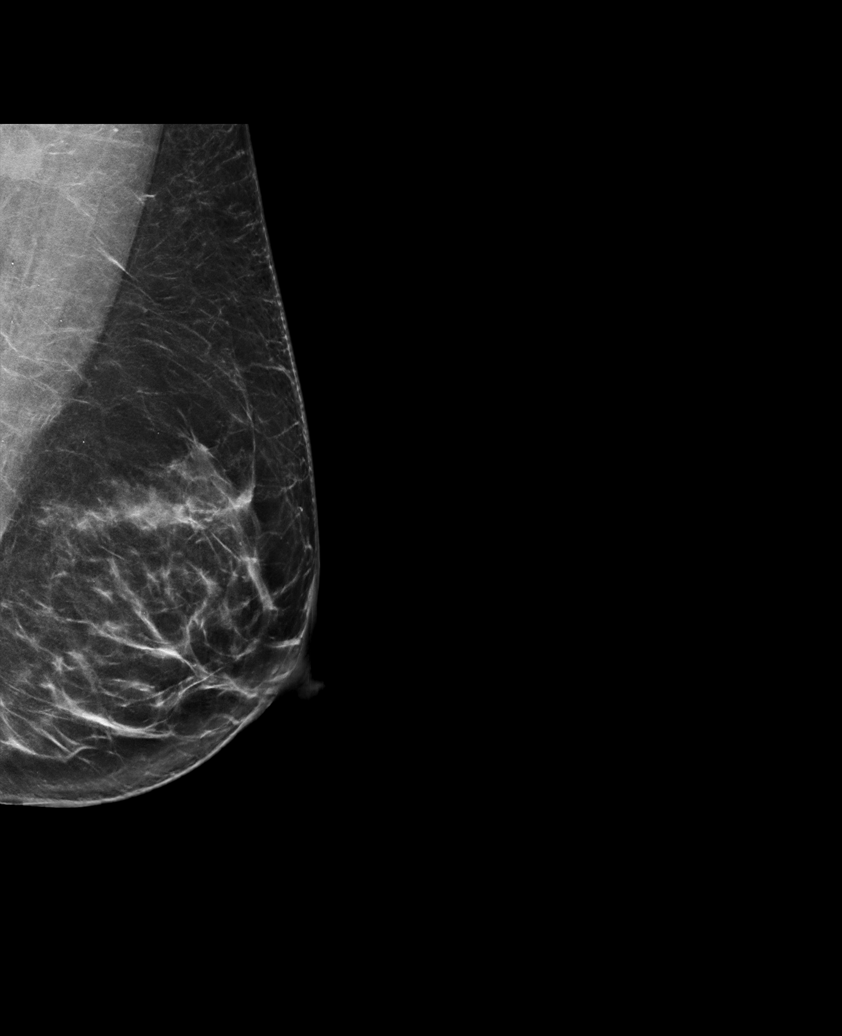

[R MLO synth-2D]
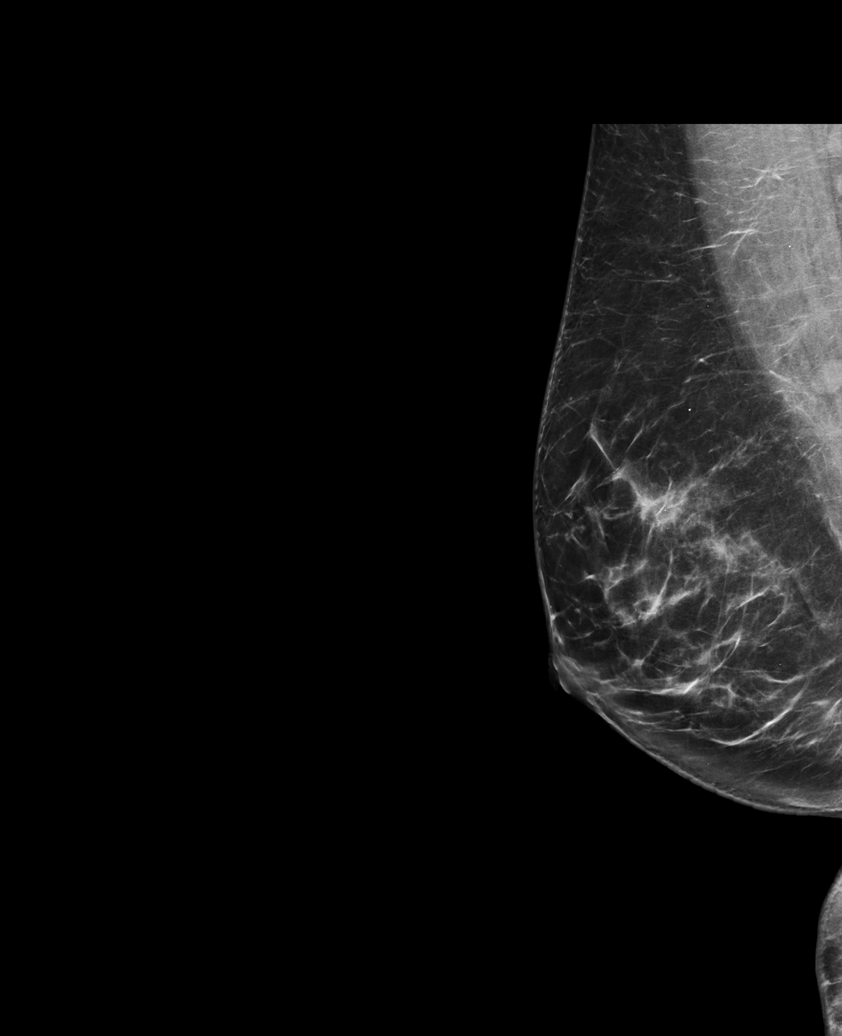

[L CC synth-2D]
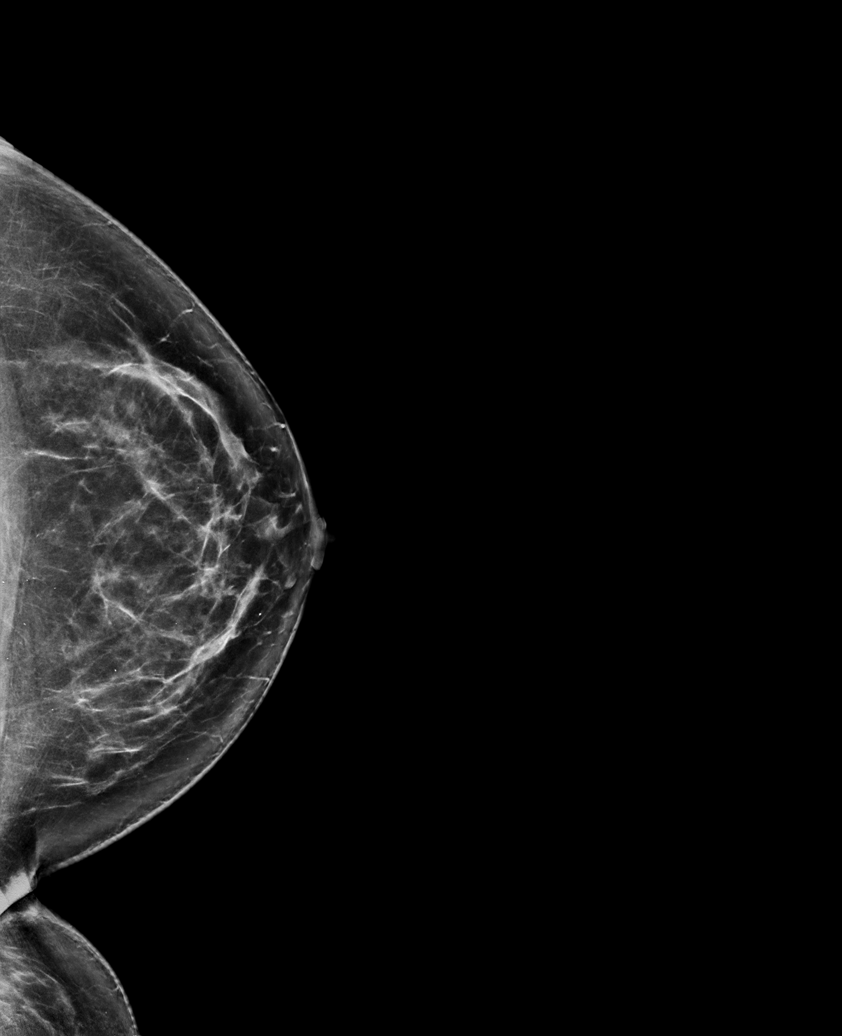

[L MLO tomo · tomo slice 41/82.0]
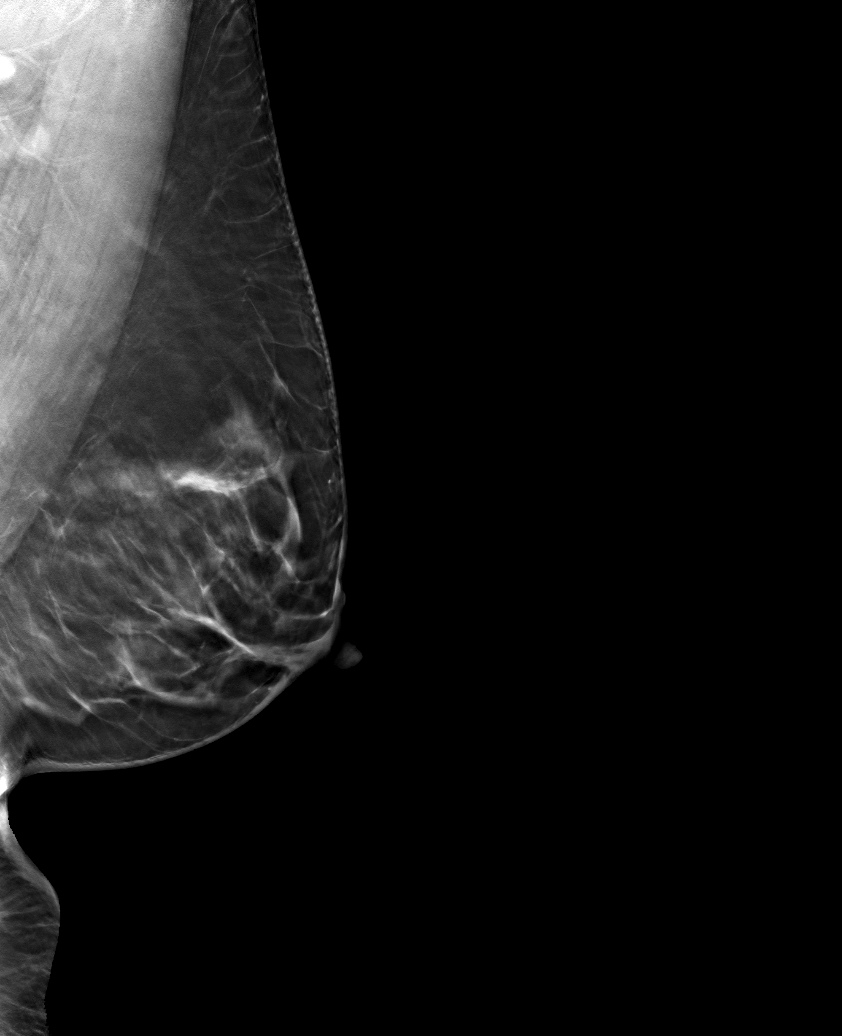

[6 of 30 positions shown; findings below may reference images not displayed]

ACR Breast Density Category b: There are scattered areas of
fibroglandular density.
FINDINGS: There are no findings suspicious for malignancy.
IMPRESSION: No mammographic evidence of malignancy. A result letter of this
screening mammogram will be mailed directly to the patient.

RECOMMENDATION:
Screening mammogram in one year. (Code:51-O-LD2)

BI-RADS CATEGORY  1: Negative.

## 2023-10-06 ENCOUNTER — Other Ambulatory Visit: Payer: Self-pay | Admitting: Family Medicine

## 2023-10-06 DIAGNOSIS — Z1231 Encounter for screening mammogram for malignant neoplasm of breast: Secondary | ICD-10-CM

## 2023-10-21 ENCOUNTER — Ambulatory Visit
Admission: RE | Admit: 2023-10-21 | Discharge: 2023-10-21 | Disposition: A | Discharge status: Home / Self Care | Source: Ambulatory Visit | Attending: Family Medicine | Admitting: Family Medicine

## 2023-10-21 DIAGNOSIS — Z1231 Encounter for screening mammogram for malignant neoplasm of breast: Secondary | ICD-10-CM | POA: Insufficient documentation
